# Patient Record
Sex: Female | Born: 1955
Health system: Southern US, Community
[De-identification: ages and names within clinical notes are randomized; demographics above are authoritative.]

## PROBLEM LIST (undated history)

## (undated) DIAGNOSIS — R569 Unspecified convulsions: Secondary | ICD-10-CM

## (undated) DIAGNOSIS — G4730 Sleep apnea, unspecified: Secondary | ICD-10-CM

## (undated) DIAGNOSIS — E119 Type 2 diabetes mellitus without complications: Secondary | ICD-10-CM

## (undated) DIAGNOSIS — C539 Malignant neoplasm of cervix uteri, unspecified: Secondary | ICD-10-CM

## (undated) DIAGNOSIS — I10 Essential (primary) hypertension: Secondary | ICD-10-CM

## (undated) DIAGNOSIS — E669 Obesity, unspecified: Secondary | ICD-10-CM

## (undated) DIAGNOSIS — Z87442 Personal history of urinary calculi: Secondary | ICD-10-CM

## (undated) DIAGNOSIS — H409 Unspecified glaucoma: Secondary | ICD-10-CM

## (undated) DIAGNOSIS — I428 Other cardiomyopathies: Secondary | ICD-10-CM

## (undated) DIAGNOSIS — I219 Acute myocardial infarction, unspecified: Secondary | ICD-10-CM

## (undated) DIAGNOSIS — F32A Depression, unspecified: Secondary | ICD-10-CM

## (undated) DIAGNOSIS — I509 Heart failure, unspecified: Secondary | ICD-10-CM

## (undated) DIAGNOSIS — F329 Major depressive disorder, single episode, unspecified: Secondary | ICD-10-CM

## (undated) HISTORY — PX: CERVIX LESION DESTRUCTION: SHX591

## (undated) HISTORY — DX: Essential (primary) hypertension: I10

## (undated) HISTORY — DX: Obesity, unspecified: E66.9

## (undated) HISTORY — DX: Heart failure, unspecified: I50.9

## (undated) HISTORY — PX: CATARACT EXTRACTION: SUR2

## (undated) HISTORY — DX: Unspecified glaucoma: H40.9

## (undated) HISTORY — DX: Other cardiomyopathies: I42.8

## (undated) HISTORY — PX: CARDIAC CATHETERIZATION: SHX172

## (undated) HISTORY — DX: Sleep apnea, unspecified: G47.30

---

## 1998-01-30 ENCOUNTER — Other Ambulatory Visit: Admission: RE | Admit: 1998-01-30 | Discharge: 1998-01-30 | Payer: Self-pay | Admitting: Obstetrics and Gynecology

## 1998-02-14 ENCOUNTER — Ambulatory Visit (HOSPITAL_COMMUNITY): Admission: RE | Admit: 1998-02-14 | Discharge: 1998-02-14 | Payer: Self-pay | Admitting: Obstetrics and Gynecology

## 1998-04-18 ENCOUNTER — Other Ambulatory Visit: Admission: RE | Admit: 1998-04-18 | Discharge: 1998-04-18 | Payer: Self-pay | Admitting: Obstetrics and Gynecology

## 1998-04-19 ENCOUNTER — Other Ambulatory Visit: Admission: RE | Admit: 1998-04-19 | Discharge: 1998-04-19 | Payer: Self-pay | Admitting: Obstetrics & Gynecology

## 1998-07-10 ENCOUNTER — Inpatient Hospital Stay (HOSPITAL_COMMUNITY): Admission: AD | Admit: 1998-07-10 | Discharge: 1998-07-11 | Payer: Self-pay | Admitting: Obstetrics and Gynecology

## 2010-09-21 ENCOUNTER — Emergency Department (HOSPITAL_COMMUNITY)
Admission: EM | Admit: 2010-09-21 | Discharge: 2010-09-21 | Payer: Self-pay | Source: Home / Self Care | Attending: Internal Medicine | Admitting: Internal Medicine

## 2010-09-21 ENCOUNTER — Inpatient Hospital Stay (HOSPITAL_COMMUNITY)
Admission: EM | Admit: 2010-09-21 | Discharge: 2010-09-25 | Payer: Self-pay | Attending: Internal Medicine | Admitting: Internal Medicine

## 2010-09-22 ENCOUNTER — Encounter: Payer: Self-pay | Admitting: Internal Medicine

## 2010-12-16 LAB — BASIC METABOLIC PANEL
BUN: 17 mg/dL (ref 6–23)
BUN: 26 mg/dL — ABNORMAL HIGH (ref 6–23)
CO2: 29 mEq/L (ref 19–32)
CO2: 29 mEq/L (ref 19–32)
CO2: 33 mEq/L — ABNORMAL HIGH (ref 19–32)
Calcium: 10.1 mg/dL (ref 8.4–10.5)
Chloride: 100 mEq/L (ref 96–112)
Chloride: 97 mEq/L (ref 96–112)
Chloride: 98 mEq/L (ref 96–112)
GFR calc Af Amer: >60 mL/min (ref >60)
GFR calc Af Amer: >60 mL/min (ref >60)
GFR calc non Af Amer: 53 mL/min — ABNORMAL LOW (ref >60)
GFR calc non Af Amer: >60 mL/min (ref >60)
Glucose, Bld: 131 mg/dL — ABNORMAL HIGH (ref 70–99)
Glucose, Bld: 181 mg/dL — ABNORMAL HIGH (ref 70–99)
Potassium: 3.9 mEq/L (ref 3.5–5.1)
Potassium: 4.1 mEq/L (ref 3.5–5.1)
Potassium: 4.3 mEq/L (ref 3.5–5.1)
Sodium: 139 mEq/L (ref 135–145)
Sodium: 141 mEq/L (ref 135–145)
Sodium: 141 mEq/L (ref 135–145)

## 2010-12-16 LAB — APTT: aPTT: 26 seconds (ref 24–37)

## 2010-12-16 LAB — GLUCOSE, CAPILLARY
Glucose-Capillary: 105 mg/dL — ABNORMAL HIGH (ref 70–99)
Glucose-Capillary: 116 mg/dL — ABNORMAL HIGH (ref 70–99)
Glucose-Capillary: 135 mg/dL — ABNORMAL HIGH (ref 70–99)
Glucose-Capillary: 202 mg/dL — ABNORMAL HIGH (ref 70–99)
Glucose-Capillary: 216 mg/dL — ABNORMAL HIGH (ref 70–99)
Glucose-Capillary: 238 mg/dL — ABNORMAL HIGH (ref 70–99)
Glucose-Capillary: 242 mg/dL — ABNORMAL HIGH (ref 70–99)
Glucose-Capillary: 80 mg/dL (ref 70–99)

## 2010-12-16 LAB — COMPREHENSIVE METABOLIC PANEL
AST: 34 U/L (ref 0–37)
Albumin: 4.1 g/dL (ref 3.5–5.2)
BUN: 18 mg/dL (ref 6–23)
Calcium: 9.4 mg/dL (ref 8.4–10.5)
Chloride: 97 mEq/L (ref 96–112)
Creatinine, Ser: 0.88 mg/dL (ref 0.4–1.2)
GFR calc Af Amer: >60 mL/min (ref >60)
GFR calc non Af Amer: >60 mL/min (ref >60)
Potassium: 4 mEq/L (ref 3.5–5.1)
Sodium: 135 mEq/L (ref 135–145)

## 2010-12-16 LAB — POCT I-STAT 3, ART BLOOD GAS (G3+)
Acid-Base Excess: 4 mmol/L — ABNORMAL HIGH (ref 0.0–2.0)
Bicarbonate: 29.8 mEq/L — ABNORMAL HIGH (ref 20.0–24.0)
pH, Arterial: 7.412 — ABNORMAL HIGH (ref 7.350–7.400)
pO2, Arterial: 71 mmHg — ABNORMAL LOW (ref 80.0–100.0)

## 2010-12-16 LAB — LIPID PANEL
HDL: 37 mg/dL — ABNORMAL LOW (ref >39)
Total CHOL/HDL Ratio: 5.7 RATIO
VLDL: 49 mg/dL — ABNORMAL HIGH (ref 0–40)

## 2010-12-16 LAB — MRSA PCR SCREENING: MRSA by PCR: NEGATIVE

## 2010-12-16 LAB — DIFFERENTIAL
Eosinophils Absolute: 0.2 10*3/uL (ref 0.0–0.7)
Eosinophils Relative: 2 % (ref 0–5)
Lymphs Abs: 2.3 10*3/uL (ref 0.7–4.0)
Monocytes Absolute: 0.5 10*3/uL (ref 0.1–1.0)
Monocytes Relative: 5 % (ref 3–12)

## 2010-12-16 LAB — CBC
HCT: 48.1 % — ABNORMAL HIGH (ref 36.0–46.0)
Hemoglobin: 15.6 g/dL — ABNORMAL HIGH (ref 12.0–15.0)
Hemoglobin: 16.4 g/dL — ABNORMAL HIGH (ref 12.0–15.0)
MCH: 30.1 pg (ref 26.0–34.0)
MCV: 89 fL (ref 78.0–100.0)
MCV: 89.2 fL (ref 78.0–100.0)
Platelets: 257 10*3/uL (ref 150–400)
RBC: 5.19 MIL/uL — ABNORMAL HIGH (ref 3.87–5.11)
RBC: 5.34 MIL/uL — ABNORMAL HIGH (ref 3.87–5.11)
RBC: 5.39 MIL/uL — ABNORMAL HIGH (ref 3.87–5.11)
RDW: 13.7 % (ref 11.5–15.5)
WBC: 10.4 10*3/uL (ref 4.0–10.5)
WBC: 10.7 10*3/uL — ABNORMAL HIGH (ref 4.0–10.5)
WBC: 9.4 10*3/uL (ref 4.0–10.5)

## 2010-12-16 LAB — RAPID URINE DRUG SCREEN, HOSP PERFORMED
Benzodiazepines: NOT DETECTED
Cocaine: NOT DETECTED
Opiates: NOT DETECTED
Tetrahydrocannabinol: NOT DETECTED

## 2010-12-16 LAB — BRAIN NATRIURETIC PEPTIDE: Pro B Natriuretic peptide (BNP): 449 pg/mL — ABNORMAL HIGH (ref 0.0–100.0)

## 2010-12-16 LAB — PROTIME-INR: INR: 0.99 (ref 0.00–1.49)

## 2010-12-16 LAB — POCT I-STAT 3, VENOUS BLOOD GAS (G3P V)
Acid-base deficit: 3 mmol/L — ABNORMAL HIGH (ref 0.0–2.0)
Bicarbonate: 24.4 mEq/L — ABNORMAL HIGH (ref 20.0–24.0)
O2 Saturation: 63 %
pCO2, Ven: 49 mmHg (ref 45.0–50.0)
pO2, Ven: 36 mmHg (ref 30.0–45.0)

## 2010-12-16 LAB — T4, FREE: Free T4: 1.23 ng/dL (ref 0.80–1.80)

## 2011-03-28 ENCOUNTER — Encounter: Payer: Self-pay | Admitting: Internal Medicine

## 2011-03-31 ENCOUNTER — Ambulatory Visit (INDEPENDENT_AMBULATORY_CARE_PROVIDER_SITE_OTHER): Payer: Self-pay | Admitting: Internal Medicine

## 2011-03-31 ENCOUNTER — Encounter: Payer: Self-pay | Admitting: Internal Medicine

## 2011-03-31 VITALS — BP 148/96 | HR 98 | Ht 63.0 in | Wt 165.8 lb

## 2011-03-31 DIAGNOSIS — E119 Type 2 diabetes mellitus without complications: Secondary | ICD-10-CM

## 2011-03-31 DIAGNOSIS — I5022 Chronic systolic (congestive) heart failure: Secondary | ICD-10-CM

## 2011-03-31 DIAGNOSIS — I10 Essential (primary) hypertension: Secondary | ICD-10-CM | POA: Insufficient documentation

## 2011-03-31 MED ORDER — LOSARTAN POTASSIUM 100 MG PO TABS: 100.0000 mg | ORAL_TABLET | Freq: Every day | ORAL | Status: DC

## 2011-03-31 MED ORDER — CARVEDILOL 25 MG PO TABS: 25.0000 mg | ORAL_TABLET | Freq: Two times a day (BID) | ORAL | Status: DC

## 2011-03-31 MED ORDER — DIGOXIN 125 MCG PO TABS: 125.0000 ug | ORAL_TABLET | Freq: Every day | ORAL | Status: DC

## 2011-03-31 MED ORDER — FUROSEMIDE 20 MG PO TABS: 20.0000 mg | ORAL_TABLET | Freq: Every day | ORAL | Status: DC

## 2011-03-31 MED ORDER — SPIRONOLACTONE 25 MG PO TABS: 25.0000 mg | ORAL_TABLET | Freq: Every day | ORAL | Status: DC

## 2011-03-31 NOTE — Assessment & Plan Note (Signed)
Much improved. Now NYHA I-II.  Volume status looks good. Suspect EF may have recovered some with improved BP control. Given cough will change captopril to losartan 100 qd (needs generic). Check labs. Needs repeat echo.

## 2011-03-31 NOTE — Assessment & Plan Note (Signed)
BP much improved. Long talk about need for strict compliance and need to f/u with [place] in HF clinic and to get PCP (will sign up with Dr. Elmore Guise - her father's PCP).  Changing captopril to losartan (also higher dose).

## 2011-03-31 NOTE — Progress Notes (Signed)
Addended by: Noralee Space on: 03/31/2011 03:46 PM   Modules accepted: Orders

## 2011-03-31 NOTE — Patient Instructions (Signed)
Stop Captopril Start Losartan 100 mg daily  Labs today (bmet, bnp, hgb A1C)  Your physician has requested that you have an echocardiogram. Echocardiography is a painless test that uses sound waves to create images of your heart. It provides your doctor with information about the size and shape of your heart and how well your heart's chambers and valves are working. This procedure takes approximately one hour. There are no restrictions for this procedure.  Your physician recommends that you schedule a follow-up appointment in: 1 month

## 2011-03-31 NOTE — Assessment & Plan Note (Signed)
Once again, discussed need for increased compliance and need for PCP and regular sugar checks. Will check HgBA1c today. She will go to see Dr. Chilton Si.

## 2011-03-31 NOTE — Progress Notes (Signed)
HPI:   Sally Hendrix is a 55 y/o obese woman with h/o poorly-controlled HTN, DM2, OSA admitted 12/11 with acute deompensated HF in setting of BP 207/132. EF 15%. Underwent cath which showed normal cors. Diuresed and started on appropriate HF meds. Was supposed to f/u with Dr. Deborah Chalk but did not. Has not seen PCP either.  Says she feels fine. Was walking 2 hours per day but stopped after her son told her to. Denies CP or exertional dyspnea. Can walk all around store without stopping. No edema, orthopnea or PND. Compliant with all meds. SBP typically 125-140. Not checking sugars regularly as she cannot find her machine. Avoiding sweets. Weight very stable.   Has severe dry cough.    ROS: All other systems normal except as mentioned in HPI, past medical history and problem list.    Past Medical History  Diagnosis Date  . CHF (congestive heart failure)   . Nonischemic cardiomyopathy   . Hypertension   . Diabetes mellitus   . Sleep apnea   . Obesity     Current Outpatient Prescriptions  Medication Sig Dispense Refill  . aspirin 81 MG tablet Take 162 mg by mouth daily.       . captopril (CAPOTEN) 25 MG tablet Take 25 mg by mouth 3 (three) times daily.        . carvedilol (COREG) 25 MG tablet Take 25 mg by mouth 2 (two) times daily with a meal.        . Coenzyme Q10 (COQ10) 100 MG CAPS Take 1 capsule by mouth daily.        . digoxin (LANOXIN) 0.125 MG tablet Take 125 mcg by mouth daily.        . Flaxseed, Linseed, (FLAX SEED OIL) 1000 MG CAPS Take 1 capsule by mouth daily.        . furosemide (LASIX) 20 MG tablet Take 20 mg by mouth daily.        Marland Kitchen glipiZIDE (GLUCOTROL) 10 MG tablet Take 10 mg by mouth daily.        . metFORMIN (GLUCOPHAGE) 1000 MG tablet Take 1,000 mg by mouth 2 (two) times daily with a meal.        . Omega-3 Fatty Acids (FISH OIL) 1000 MG CAPS Take 1 capsule by mouth daily.        . Red Yeast Rice 600 MG CAPS Take 2 capsules by mouth daily.        Marland Kitchen spironolactone (ALDACTONE)  25 MG tablet Take 25 mg by mouth daily.        Marland Kitchen DISCONTD: atorvastatin (LIPITOR) 20 MG tablet Take 20 mg by mouth daily.           No Known Allergies  History   Social History  . Marital Status: Married    Spouse Name: N/A    Number of Children: 2  . Years of Education: N/A   Occupational History  . Sports coach    Social History Main Topics  . Smoking status: Former Games developer  . Smokeless tobacco: Not on file  . Alcohol Use: Yes     occasional  . Drug Use: No  . Sexually Active: Not on file   Other Topics Concern  . Not on file   Social History Narrative  . No narrative on file    Family History  Problem Relation Age of Onset  . Heart failure Mother     hx of - died of nonalcoholic cirrhosis  . Coronary artery  disease Father 35    alive  . Other Brother     brain tumor    PHYSICAL EXAM: Filed Vitals:   03/31/11 1501  BP: 148/96  Pulse: 98   General:  Well appearing. No respiratory difficulty HEENT: normal Neck: supple. no JVD. Carotids 2+ bilat; no bruits. No lymphadenopathy or thryomegaly appreciated. Cor: PMI nondisplaced. Regular rate & rhythm. No rubs, gallops or murmurs. Lungs: clear Abdomen: obese. soft, nontender, nondistended. No hepatosplenomegaly. No bruits or masses. Good bowel sounds. Extremities: no cyanosis, clubbing, rash, edema Neuro: alert & oriented x 3, cranial nerves grossly intact. moves all 4 extremities w/o difficulty. Affect pleasant.  ECG: NSR 99 Non specifc ST-T wave abnormalities.    ASSESSMENT & PLAN:

## 2011-04-01 ENCOUNTER — Telehealth: Payer: Self-pay | Admitting: Internal Medicine

## 2011-04-01 LAB — BASIC METABOLIC PANEL
CO2: 27 mEq/L (ref 19–32)
Calcium: 10.1 mg/dL (ref 8.4–10.5)
Creatinine, Ser: 1.1 mg/dL (ref 0.4–1.2)
GFR: 55.13 mL/min — ABNORMAL LOW (ref >60.00)

## 2011-04-01 NOTE — Telephone Encounter (Signed)
Pt's office notes are being faxed to dr Dorma Russell green not their pt, maybe art green they said

## 2011-04-01 NOTE — Telephone Encounter (Signed)
Heather, please check to see where these notes are going--we have been sending notes to wrong dr Suezanne Cheshire nt

## 2011-04-02 NOTE — Telephone Encounter (Signed)
Faxed to Dr Lenoria Farrier Chilton Si

## 2011-04-10 ENCOUNTER — Ambulatory Visit (HOSPITAL_COMMUNITY): Payer: Self-pay | Attending: Internal Medicine

## 2011-04-10 DIAGNOSIS — E119 Type 2 diabetes mellitus without complications: Secondary | ICD-10-CM | POA: Insufficient documentation

## 2011-04-10 DIAGNOSIS — E669 Obesity, unspecified: Secondary | ICD-10-CM | POA: Insufficient documentation

## 2011-04-10 DIAGNOSIS — I5022 Chronic systolic (congestive) heart failure: Secondary | ICD-10-CM

## 2011-04-10 DIAGNOSIS — I10 Essential (primary) hypertension: Secondary | ICD-10-CM | POA: Insufficient documentation

## 2011-04-14 ENCOUNTER — Telehealth: Payer: Self-pay | Admitting: Internal Medicine

## 2011-04-14 NOTE — Telephone Encounter (Signed)
Pt aware of echo results and very excited

## 2011-04-14 NOTE — Telephone Encounter (Signed)
Test results

## 2011-05-05 ENCOUNTER — Encounter: Payer: Self-pay | Admitting: Internal Medicine

## 2011-05-05 ENCOUNTER — Ambulatory Visit (INDEPENDENT_AMBULATORY_CARE_PROVIDER_SITE_OTHER): Payer: Self-pay | Admitting: Internal Medicine

## 2011-05-05 VITALS — BP 138/84 | HR 86 | Resp 16 | Ht 63.0 in | Wt 162.0 lb

## 2011-05-05 DIAGNOSIS — I5022 Chronic systolic (congestive) heart failure: Secondary | ICD-10-CM

## 2011-05-05 DIAGNOSIS — I10 Essential (primary) hypertension: Secondary | ICD-10-CM

## 2011-05-05 MED ORDER — FUROSEMIDE 20 MG PO TABS: 20.0000 mg | ORAL_TABLET | ORAL | Status: DC

## 2011-05-05 NOTE — Progress Notes (Signed)
HPI:   Sally Hendrix is a 55 y/o woman with h/o poorly-controlled HTN, DM2, OSA admitted 12/11 with acute deompensated HF in setting of BP 207/132. EF 15%. Underwent cath which showed normal cors. Diuresed and started on appropriate HF meds.   At last visit we switched ACE-I to losartan due to cough.  Echo April 10, 2011. EF now 60%. RV normal.   Feels great. Walking an hour day. Denies CP or exertional dyspnea. Can walk all around store without stopping. No edema, orthopnea or PND. Compliant with all meds. SBP typically 120-130s/70.  Weight down 3 pounds.Cough resolved with discontinuation with ACE-I.    ROS: All other systems normal except as mentioned in HPI, past medical history and problem list.    Past Medical History  Diagnosis Date  . CHF (congestive heart failure)   . Nonischemic cardiomyopathy   . Hypertension   . Diabetes mellitus   . Sleep apnea   . Obesity     Current Outpatient Prescriptions  Medication Sig Dispense Refill  . aspirin 81 MG tablet Take 81 mg by mouth daily.       . carvedilol (COREG) 25 MG tablet Take 1 tablet (25 mg total) by mouth 2 (two) times daily with a meal.  60 tablet  6  . Coenzyme Q10 (COQ10) 100 MG CAPS Take 1 capsule by mouth daily.        . digoxin (LANOXIN) 0.125 MG tablet Take 1 tablet (125 mcg total) by mouth daily.  30 tablet  6  . Flaxseed, Linseed, (FLAX SEED OIL) 1000 MG CAPS Take 1 capsule by mouth daily.        . furosemide (LASIX) 20 MG tablet Take 1 tablet (20 mg total) by mouth daily.  30 tablet  6  . glipiZIDE (GLUCOTROL) 10 MG tablet Take 10 mg by mouth daily.        Marland Kitchen losartan (COZAAR) 100 MG tablet Take 1 tablet (100 mg total) by mouth daily.  30 tablet  6  . metFORMIN (GLUCOPHAGE) 1000 MG tablet Take 1,000 mg by mouth 2 (two) times daily with a meal.        . Omega-3 Fatty Acids (FISH OIL) 1000 MG CAPS Take 1 capsule by mouth daily.        . Red Yeast Rice 600 MG CAPS Take 2 capsules by mouth daily.        Marland Kitchen spironolactone  (ALDACTONE) 25 MG tablet Take 1 tablet (25 mg total) by mouth daily.  30 tablet  6     No Known Allergies  History   Social History  . Marital Status: Married    Spouse Name: N/A    Number of Children: 2  . Years of Education: N/A   Occupational History  . Sports coach    Social History Main Topics  . Smoking status: Former Games developer  . Smokeless tobacco: Not on file  . Alcohol Use: Yes     occasional  . Drug Use: No  . Sexually Active: Not on file   Other Topics Concern  . Not on file   Social History Narrative  . No narrative on file    Family History  Problem Relation Age of Onset  . Heart failure Mother     hx of - died of nonalcoholic cirrhosis  . Coronary artery disease Father 64    alive  . Other Brother     brain tumor    PHYSICAL EXAM: Filed Vitals:   05/05/11 1430  BP: 142/88  Pulse: 86  Resp: 16   General:  Well appearing. No respiratory difficulty HEENT: normal Neck: supple. no JVD. Carotids 2+ bilat; no bruits. No lymphadenopathy or thryomegaly appreciated. Cor: PMI nondisplaced. Regular rate & rhythm. No rubs, gallops or murmurs. Lungs: clear Abdomen: obese. soft, nontender, nondistended. No hepatosplenomegaly. No bruits or masses. Good bowel sounds. Extremities: no cyanosis, clubbing, rash, edema Neuro: alert & oriented x 3, cranial nerves grossly intact. moves all 4 extremities w/o difficulty. Affect pleasant.   ASSESSMENT & PLAN:

## 2011-05-05 NOTE — Assessment & Plan Note (Addendum)
Doing very well. NYHA I. Volume status looks good. EF has made full recovery with control of BP.  At goal with HF meds. Decrease lasix to qod; can take additional doses as needed for edema or weight gain.

## 2011-05-05 NOTE — Patient Instructions (Signed)
Decrease Lasix to every other day  Your physician recommends that you schedule a follow-up appointment in: 2 months in heart failure clinic

## 2011-05-05 NOTE — Assessment & Plan Note (Signed)
BP much improved. Systolics a bit borderline. Have asked her to continue to follow closely. If SBP persistently above 140 would add amlodipine 2.5mg  daily.

## 2011-06-30 ENCOUNTER — Encounter (HOSPITAL_COMMUNITY): Payer: Self-pay

## 2016-12-09 ENCOUNTER — Encounter (HOSPITAL_COMMUNITY): Payer: Self-pay

## 2016-12-09 ENCOUNTER — Emergency Department (HOSPITAL_COMMUNITY): Payer: Self-pay

## 2016-12-09 ENCOUNTER — Inpatient Hospital Stay (HOSPITAL_COMMUNITY)
Admission: EM | Admit: 2016-12-09 | Discharge: 2016-12-11 | DRG: 246 | Disposition: A | Discharge status: Home / Self Care | Payer: Self-pay | Attending: Internal Medicine | Admitting: Internal Medicine

## 2016-12-09 DIAGNOSIS — I428 Other cardiomyopathies: Secondary | ICD-10-CM | POA: Diagnosis present

## 2016-12-09 DIAGNOSIS — G4733 Obstructive sleep apnea (adult) (pediatric): Secondary | ICD-10-CM | POA: Diagnosis present

## 2016-12-09 DIAGNOSIS — N3000 Acute cystitis without hematuria: Secondary | ICD-10-CM | POA: Diagnosis present

## 2016-12-09 DIAGNOSIS — I5023 Acute on chronic systolic (congestive) heart failure: Secondary | ICD-10-CM | POA: Diagnosis present

## 2016-12-09 DIAGNOSIS — R079 Chest pain, unspecified: Secondary | ICD-10-CM | POA: Diagnosis present

## 2016-12-09 DIAGNOSIS — I214 Non-ST elevation (NSTEMI) myocardial infarction: Principal | ICD-10-CM | POA: Diagnosis present

## 2016-12-09 DIAGNOSIS — Z6826 Body mass index (BMI) 26.0-26.9, adult: Secondary | ICD-10-CM

## 2016-12-09 DIAGNOSIS — I160 Hypertensive urgency: Secondary | ICD-10-CM | POA: Diagnosis present

## 2016-12-09 DIAGNOSIS — E785 Hyperlipidemia, unspecified: Secondary | ICD-10-CM | POA: Diagnosis present

## 2016-12-09 DIAGNOSIS — E1165 Type 2 diabetes mellitus with hyperglycemia: Secondary | ICD-10-CM | POA: Diagnosis present

## 2016-12-09 DIAGNOSIS — Z79899 Other long term (current) drug therapy: Secondary | ICD-10-CM

## 2016-12-09 DIAGNOSIS — I5022 Chronic systolic (congestive) heart failure: Secondary | ICD-10-CM | POA: Diagnosis present

## 2016-12-09 DIAGNOSIS — Z87891 Personal history of nicotine dependence: Secondary | ICD-10-CM

## 2016-12-09 DIAGNOSIS — Z7984 Long term (current) use of oral hypoglycemic drugs: Secondary | ICD-10-CM

## 2016-12-09 DIAGNOSIS — Z9114 Patient's other noncompliance with medication regimen: Secondary | ICD-10-CM

## 2016-12-09 DIAGNOSIS — I10 Essential (primary) hypertension: Secondary | ICD-10-CM

## 2016-12-09 DIAGNOSIS — Z955 Presence of coronary angioplasty implant and graft: Secondary | ICD-10-CM

## 2016-12-09 DIAGNOSIS — Z7982 Long term (current) use of aspirin: Secondary | ICD-10-CM

## 2016-12-09 DIAGNOSIS — I2510 Atherosclerotic heart disease of native coronary artery without angina pectoris: Secondary | ICD-10-CM | POA: Diagnosis present

## 2016-12-09 DIAGNOSIS — E669 Obesity, unspecified: Secondary | ICD-10-CM | POA: Diagnosis present

## 2016-12-09 DIAGNOSIS — R000 Tachycardia, unspecified: Secondary | ICD-10-CM | POA: Diagnosis present

## 2016-12-09 DIAGNOSIS — B9620 Unspecified Escherichia coli [E. coli] as the cause of diseases classified elsewhere: Secondary | ICD-10-CM | POA: Diagnosis present

## 2016-12-09 DIAGNOSIS — I110 Hypertensive heart disease with heart failure: Secondary | ICD-10-CM | POA: Diagnosis present

## 2016-12-09 LAB — CBC WITH DIFFERENTIAL/PLATELET
BASOS ABS: 0 10*3/uL (ref 0.0–0.1)
Basophils Relative: 0 %
Eosinophils Absolute: 0.2 10*3/uL (ref 0.0–0.7)
Eosinophils Relative: 2 %
HEMATOCRIT: 42 % (ref 36.0–46.0)
HEMOGLOBIN: 14.4 g/dL (ref 12.0–15.0)
LYMPHS ABS: 3.1 10*3/uL (ref 0.7–4.0)
LYMPHS PCT: 28 %
MCH: 29.1 pg (ref 26.0–34.0)
MCHC: 34.3 g/dL (ref 30.0–36.0)
MCV: 84.8 fL (ref 78.0–100.0)
Monocytes Absolute: 0.5 10*3/uL (ref 0.1–1.0)
Monocytes Relative: 5 %
NEUTROS ABS: 7.1 10*3/uL (ref 1.7–7.7)
NEUTROS PCT: 65 %
Platelets: 264 10*3/uL (ref 150–400)
RBC: 4.95 MIL/uL (ref 3.87–5.11)
RDW: 13.3 % (ref 11.5–15.5)
WBC: 11 10*3/uL — AB (ref 4.0–10.5)

## 2016-12-09 LAB — COMPREHENSIVE METABOLIC PANEL
ALK PHOS: 119 U/L (ref 38–126)
ALT: 14 U/L (ref 14–54)
ANION GAP: 9 (ref 5–15)
AST: 27 U/L (ref 15–41)
Albumin: 3.6 g/dL (ref 3.5–5.0)
BUN: 15 mg/dL (ref 6–20)
CALCIUM: 9.2 mg/dL (ref 8.9–10.3)
CO2: 28 mmol/L (ref 22–32)
Chloride: 97 mmol/L — ABNORMAL LOW (ref 101–111)
Creatinine, Ser: 0.84 mg/dL (ref 0.44–1.00)
GFR calc non Af Amer: >60 mL/min (ref >60)
Glucose, Bld: 365 mg/dL — ABNORMAL HIGH (ref 65–99)
Potassium: 4.1 mmol/L (ref 3.5–5.1)
SODIUM: 134 mmol/L — AB (ref 135–145)
Total Bilirubin: 0.9 mg/dL (ref 0.3–1.2)
Total Protein: 6.7 g/dL (ref 6.5–8.1)

## 2016-12-09 LAB — RAPID URINE DRUG SCREEN, HOSP PERFORMED
AMPHETAMINES: NOT DETECTED
Barbiturates: NOT DETECTED
Benzodiazepines: NOT DETECTED
COCAINE: NOT DETECTED
OPIATES: NOT DETECTED
TETRAHYDROCANNABINOL: NOT DETECTED

## 2016-12-09 LAB — URINALYSIS, ROUTINE W REFLEX MICROSCOPIC
Bilirubin Urine: NEGATIVE
Hgb urine dipstick: NEGATIVE
KETONES UR: 5 mg/dL — AB
NITRITE: POSITIVE — AB
PH: 6 (ref 5.0–8.0)
Protein, ur: 100 mg/dL — AB
SPECIFIC GRAVITY, URINE: 1.021 (ref 1.005–1.030)

## 2016-12-09 LAB — CBC
HCT: 42.4 % (ref 36.0–46.0)
HEMOGLOBIN: 14.7 g/dL (ref 12.0–15.0)
MCH: 29.4 pg (ref 26.0–34.0)
MCHC: 34.7 g/dL (ref 30.0–36.0)
MCV: 84.8 fL (ref 78.0–100.0)
Platelets: 254 10*3/uL (ref 150–400)
RBC: 5 MIL/uL (ref 3.87–5.11)
RDW: 13.3 % (ref 11.5–15.5)
WBC: 10.4 10*3/uL (ref 4.0–10.5)

## 2016-12-09 LAB — CREATININE, SERUM
CREATININE: 0.88 mg/dL (ref 0.44–1.00)
GFR calc non Af Amer: >60 mL/min (ref >60)

## 2016-12-09 LAB — GLUCOSE, CAPILLARY: Glucose-Capillary: 353 mg/dL — ABNORMAL HIGH (ref 65–99)

## 2016-12-09 LAB — BRAIN NATRIURETIC PEPTIDE: B Natriuretic Peptide: 20 pg/mL (ref 0.0–100.0)

## 2016-12-09 LAB — TROPONIN I
TROPONIN I: 0.03 ng/mL — AB (ref <0.03)
Troponin I: 0.09 ng/mL (ref <0.03)
Troponin I: 0.14 ng/mL (ref <0.03)

## 2016-12-09 LAB — DIGOXIN LEVEL: Digoxin Level: <0.2 ng/mL — ABNORMAL LOW (ref 0.8–2.0)

## 2016-12-09 MED ORDER — HYDRALAZINE HCL 20 MG/ML IJ SOLN
10.0000 mg | INTRAMUSCULAR | Status: DC | PRN
  Administered 2016-12-10: 10 mg via INTRAVENOUS
  Filled 2016-12-09: qty 1

## 2016-12-09 MED ORDER — ACETAMINOPHEN 325 MG PO TABS: 650.0000 mg | ORAL_TABLET | ORAL | Status: DC | PRN

## 2016-12-09 MED ORDER — ASPIRIN EC 325 MG PO TBEC
325.0000 mg | DELAYED_RELEASE_TABLET | Freq: Every day | ORAL | Status: DC
  Administered 2016-12-10: 325 mg via ORAL
  Filled 2016-12-09: qty 1

## 2016-12-09 MED ORDER — NITROGLYCERIN 0.4 MG SL SUBL: 0.4000 mg | SUBLINGUAL_TABLET | SUBLINGUAL | Status: DC | PRN

## 2016-12-09 MED ORDER — INSULIN ASPART 100 UNIT/ML ~~LOC~~ SOLN
4.0000 [IU] | Freq: Once | SUBCUTANEOUS | Status: AC
  Administered 2016-12-10: 4 [IU] via SUBCUTANEOUS

## 2016-12-09 MED ORDER — SULFAMETHOXAZOLE-TRIMETHOPRIM 800-160 MG PO TABS: 1.0000 | ORAL_TABLET | Freq: Once | ORAL | Status: DC

## 2016-12-09 MED ORDER — ONDANSETRON HCL 4 MG/2ML IJ SOLN: 4.0000 mg | Freq: Four times a day (QID) | INTRAMUSCULAR | Status: DC | PRN

## 2016-12-09 MED ORDER — GABAPENTIN 300 MG PO CAPS
300.0000 mg | ORAL_CAPSULE | Freq: Every day | ORAL | Status: DC
  Administered 2016-12-09 – 2016-12-10 (×2): 300 mg via ORAL
  Filled 2016-12-09 (×2): qty 1

## 2016-12-09 MED ORDER — ENOXAPARIN SODIUM 40 MG/0.4ML ~~LOC~~ SOLN
40.0000 mg | Freq: Every day | SUBCUTANEOUS | Status: DC
  Administered 2016-12-09: 40 mg via SUBCUTANEOUS
  Filled 2016-12-09 (×2): qty 0.4

## 2016-12-09 MED ORDER — LOSARTAN POTASSIUM 50 MG PO TABS
100.0000 mg | ORAL_TABLET | Freq: Every day | ORAL | Status: DC
  Administered 2016-12-09 – 2016-12-11 (×3): 100 mg via ORAL
  Filled 2016-12-09 (×4): qty 2

## 2016-12-09 MED ORDER — CARVEDILOL 12.5 MG PO TABS
25.0000 mg | ORAL_TABLET | Freq: Two times a day (BID) | ORAL | Status: DC
  Administered 2016-12-10 – 2016-12-11 (×2): 25 mg via ORAL
  Filled 2016-12-09 (×2): qty 2

## 2016-12-09 MED ORDER — METOPROLOL TARTRATE 5 MG/5ML IV SOLN
5.0000 mg | Freq: Once | INTRAVENOUS | Status: DC
  Filled 2016-12-09 (×2): qty 5

## 2016-12-09 MED ORDER — DIGOXIN 125 MCG PO TABS
125.0000 ug | ORAL_TABLET | Freq: Every day | ORAL | Status: DC
  Administered 2016-12-09 – 2016-12-10 (×2): 125 ug via ORAL
  Filled 2016-12-09 (×2): qty 1

## 2016-12-09 MED ORDER — INSULIN ASPART 100 UNIT/ML ~~LOC~~ SOLN
0.0000 [IU] | Freq: Three times a day (TID) | SUBCUTANEOUS | Status: DC
  Administered 2016-12-10: 7 [IU] via SUBCUTANEOUS
  Administered 2016-12-10: 5 [IU] via SUBCUTANEOUS
  Administered 2016-12-10: 2 [IU] via SUBCUTANEOUS

## 2016-12-09 NOTE — ED Notes (Signed)
Dr. Gilford Raid aware of trop.

## 2016-12-09 NOTE — Plan of Care (Signed)
Problem: Pain Managment: Goal: General experience of comfort will improve Outcome: Progressing Denies any pain at this time.

## 2016-12-09 NOTE — ED Notes (Signed)
Pt states she took 100 mg losartan and 25 mg of carvedilol from her purse. Will inform MD

## 2016-12-09 NOTE — ED Provider Notes (Addendum)
[facility] DEPT Provider Note   CSN: [id number] Arrival date & time: 12/09/16  1617     History   Chief Complaint Chief Complaint  Patient presents with  . Chest Pain    HPI Sally Hendrix is a 61 y.o. female.  Pt presents to the ED today with CP.  Pt said it started this morning.  She has a hx of HTN and DM.  She has not taken any of her meds since 3/2.  The pt said she got busy and forgot.  The pt does have a hx of nonischemic cardiomyopathy with an initial EF of 15% in December of 2011.  She had a cath at the time which showed normal coronary arteries.  The pt had a repeat ECHO in July of 2012 which showed an EF up to 60%.  This is the last echo in the chart.  The pt initially went to urgent care who gave her 1 nitro which relieved her pain.  No return of pain now.  She also had 4 asa today.       Past Medical History:  Diagnosis Date  . CHF (congestive heart failure) ([place])   . Diabetes mellitus   . Hypertension   . Nonischemic cardiomyopathy ([place])   . Obesity   . Sleep apnea     Patient Active Problem List   Diagnosis Date Noted  . Chest pain 12/09/2016  . Chronic systolic heart failure ([place]) 03/31/2011  . Essential hypertension, benign 03/31/2011  . DM2 (diabetes mellitus, type 2) ([place]) 03/31/2011    Past Surgical History:  Procedure Laterality Date  . CARDIAC CATHETERIZATION      OB History    No data available       Home Medications    Prior to Admission medications   Medication Sig Start Date End Date Taking? Authorizing Provider  aspirin EC 81 MG tablet Take 324 mg by mouth once.   Yes Historical Provider, MD  carvedilol (COREG) 25 MG tablet Take 1 tablet (25 mg total) by mouth 2 (two) times daily with a meal. 03/31/11  Yes Jolaine Artist, MD  digoxin (LANOXIN) 0.125 MG tablet Take 1 tablet (125 mcg total) by mouth daily. 03/31/11  Yes Jolaine Artist, MD  furosemide (LASIX) 20 MG tablet Take 1 tablet (20 mg total) by mouth every other day.  05/05/11  Yes Jolaine Artist, MD  gabapentin (NEURONTIN) 300 MG capsule Take 300 mg by mouth at bedtime.   Yes Historical Provider, MD  glimepiride (AMARYL) 2 MG tablet Take 2 mg by mouth daily with breakfast.   Yes Historical Provider, MD  ibuprofen (ADVIL,MOTRIN) 200 MG tablet Take 800 mg by mouth every 6 (six) hours as needed for moderate pain.   Yes Historical Provider, MD  losartan (COZAAR) 100 MG tablet Take 1 tablet (100 mg total) by mouth daily. 03/31/11 12/09/16 Yes Shaune Pascal Bensimhon, MD  metFORMIN (GLUCOPHAGE) 1000 MG tablet Take 500 mg by mouth 2 (two) times daily with a meal.    Yes Historical Provider, MD  aspirin 81 MG tablet Take 81 mg by mouth daily.     Historical Provider, MD    Family History Family History  Problem Relation Age of Onset  . Heart failure Mother     hx of - died of nonalcoholic cirrhosis  . Coronary artery disease Father 39    alive  . Other Brother     brain tumor    Social History Social History  Substance  Use Topics  . Smoking status: Former Research scientist (life sciences)  . Smokeless tobacco: Never Used  . Alcohol use Yes     Comment: occasional     Allergies   Patient has no known allergies.   Review of Systems Review of Systems  Cardiovascular: Positive for chest pain.  All other systems reviewed and are negative.    Physical Exam Updated Vital Signs BP 147/88   Pulse 100   Temp 98.7 F (37.1 C)   Resp 17   Ht 5\' 3"  (1.6 m)   Wt 149 lb (67.6 kg)   SpO2 98%   BMI 26.39 kg/m   Physical Exam  Constitutional: She is oriented to person, place, and time. She appears well-developed and well-nourished.  HENT:  Head: Normocephalic and atraumatic.  Right Ear: External ear normal.  Left Ear: External ear normal.  Nose: Nose normal.  Mouth/Throat: Oropharynx is clear and moist.  Eyes: Conjunctivae and EOM are normal. Pupils are equal, round, and reactive to light.  Neck: Normal range of motion. Neck supple.  Cardiovascular: Regular rhythm, normal  heart sounds and intact distal pulses.  Tachycardia present.   Pulmonary/Chest: Effort normal and breath sounds normal.  Abdominal: Soft. Bowel sounds are normal.  Musculoskeletal: Normal range of motion.  Neurological: She is alert and oriented to person, place, and time.  Skin: Skin is warm and dry.  Psychiatric: She has a normal mood and affect. Her behavior is normal. Judgment and thought content normal.  Nursing note and vitals reviewed.    ED Treatments / Results  Labs (all labs ordered are listed, but only abnormal results are displayed) Labs Reviewed  COMPREHENSIVE METABOLIC PANEL - Abnormal; Notable for the following:       Result Value   Sodium 134 (*)    Chloride 97 (*)    Glucose, Bld 365 (*)    All other components within normal limits  TROPONIN I - Abnormal; Notable for the following:    Troponin I 0.03 (*)    All other components within normal limits  URINALYSIS, ROUTINE W REFLEX MICROSCOPIC - Abnormal; Notable for the following:    APPearance CLOUDY (*)    Glucose, UA >=500 (*)    Ketones, ur 5 (*)    Protein, ur 100 (*)    Nitrite POSITIVE (*)    Leukocytes, UA TRACE (*)    Bacteria, UA FEW (*)    Squamous Epithelial / LPF 0-5 (*)    All other components within normal limits  DIGOXIN LEVEL - Abnormal; Notable for the following:    Digoxin Level <0.2 (*)    All other components within normal limits  CBC WITH DIFFERENTIAL/PLATELET - Abnormal; Notable for the following:    WBC 11.0 (*)    All other components within normal limits  BRAIN NATRIURETIC PEPTIDE  TROPONIN I    EKG  EKG Interpretation  Date/Time:  Tuesday December 09 2016 16:18:33 EST Ventricular Rate:  112 PR Interval:    QRS Duration: 103 QT Interval:  360 QTC Calculation: 492 R Axis:   -29 Text Interpretation:  Sinus tachycardia Probable left atrial enlargement Borderline left axis deviation Abnormal R-wave progression, late transition Borderline prolonged QT interval Confirmed by Gilford Raid  MD, Kwali Wrinkle ([id number]) on 12/09/2016 4:30:56 PM       Radiology Dg Chest 2 View  Result Date: 12/09/2016 CLINICAL DATA:  Left-sided chest pain radiating down the left arm, some shortness of breath EXAM: CHEST  2 VIEW COMPARISON:  Chest x-ray of 09/23/2010 FINDINGS:  No definite active infiltrate or effusion is seen. Mediastinal and hilar contours are unremarkable. Mild cardiomegaly is stable. The only questionable abnormality is deep within the lung posterior sulcus on the lateral view. This area of opacity probably is due to overlapping bony structures, but a subtle lung lesion would be very difficult to exclude. Consider either followup chest x-ray or CT of the chest to assess further. IMPRESSION: 1. No definite active process. 2. Opacity deep in the posterior lung sulcus only on the lateral view probably due to overlapping bony structures but recommend attention to this area on followup chest x-ray. Electronically Signed   By: Ivar Drape M.D.   On: 12/09/2016 16:58    Procedures Procedures (including critical care time)  Medications Ordered in ED Medications  metoprolol (LOPRESSOR) injection 5 mg (5 mg Intravenous Not Given 12/09/16 1800)  sulfamethoxazole-trimethoprim (BACTRIM DS,SEPTRA DS) 800-160 MG per tablet 1 tablet (not administered)     Initial Impression / Assessment and Plan / ED Course  I have reviewed the triage vital signs and the nursing notes.  Pertinent labs & imaging results that were available during my care of the patient were reviewed by me and considered in my medical decision making (see chart for details).    Pt took her normal home meds in her room.  BP is still high, so she was given 5 mg of lopressor.  Pt d/w Dr. Hal Hope (hospitalist) for admission.  Pt also has an UTI and was treated with bactrim.  Final Clinical Impressions(s) / ED Diagnoses   Final diagnoses:  Chest pain, unspecified type  Poorly controlled type 2 diabetes mellitus ([place])  Essential  hypertension  Acute cystitis without hematuria    New Prescriptions New Prescriptions   No medications on file     Isla Pence, MD 12/09/16 1946    Isla Pence, MD 12/09/16 2136

## 2016-12-09 NOTE — H&P (Addendum)
History and Physical    Sally Hendrix [id number] DOB: Aug 22, 1956 DOA: 12/09/2016  PCP: Donnajean Lopes, MD  Patient coming from: Home.  Chief Complaint: Chest pain.  HPI: Sally Hendrix is a 61 y.o. female with history of nonischemic cardiomyopathy, hypertension and diabetes mellitus presents to the ER because of chest pain. Patient started experience chest pain yesterday afternoon which was retrosternal and lasted for a few minutes and resolved after patient took some aspirin. Then patient took a small nap and woke up and started experiencing chest pain again and at this time the pain was radiating to his left arm. Patient took aspirin again. Patient was brought to the ER by patient's daughter.   ED Course: In the ER patient's blood pressure is found to be markedly elevated. Patient's chest pain resolved with sublingual nitroglycerin. EKG shows sinus tachycardia. Chest x-ray shows nonspecific opacity but nothing acute. Blood sugar is found to be elevated but not in DKA. Patient states over the last 10 days patient has not taken her medications.  Review of Systems: As per HPI, rest all negative.   Past Medical History:  Diagnosis Date  . CHF (congestive heart failure) ([place])   . Diabetes mellitus   . Hypertension   . Nonischemic cardiomyopathy ([place])   . Obesity   . Sleep apnea     Past Surgical History:  Procedure Laterality Date  . CARDIAC CATHETERIZATION       reports that she has quit smoking. She has never used smokeless tobacco. She reports that she drinks alcohol. She reports that she does not use drugs.  No Known Allergies  Family History  Problem Relation Age of Onset  . Heart failure Mother     hx of - died of nonalcoholic cirrhosis  . Coronary artery disease Father 52    alive  . Other Brother     brain tumor    Prior to Admission medications   Medication Sig Start Date End Date Taking? Authorizing Provider  aspirin EC 81 MG tablet Take 324 mg by mouth  once.   Yes Historical Provider, MD  carvedilol (COREG) 25 MG tablet Take 1 tablet (25 mg total) by mouth 2 (two) times daily with a meal. 03/31/11  Yes Jolaine Artist, MD  digoxin (LANOXIN) 0.125 MG tablet Take 1 tablet (125 mcg total) by mouth daily. 03/31/11  Yes Jolaine Artist, MD  furosemide (LASIX) 20 MG tablet Take 1 tablet (20 mg total) by mouth every other day. 05/05/11  Yes Jolaine Artist, MD  gabapentin (NEURONTIN) 300 MG capsule Take 300 mg by mouth at bedtime.   Yes Historical Provider, MD  glimepiride (AMARYL) 2 MG tablet Take 2 mg by mouth daily with breakfast.   Yes Historical Provider, MD  ibuprofen (ADVIL,MOTRIN) 200 MG tablet Take 800 mg by mouth every 6 (six) hours as needed for moderate pain.   Yes Historical Provider, MD  losartan (COZAAR) 100 MG tablet Take 1 tablet (100 mg total) by mouth daily. 03/31/11 12/09/16 Yes Shaune Pascal Bensimhon, MD  metFORMIN (GLUCOPHAGE) 1000 MG tablet Take 500 mg by mouth 2 (two) times daily with a meal.    Yes Historical Provider, MD  aspirin 81 MG tablet Take 81 mg by mouth daily.     Historical Provider, MD    Physical Exam: Vitals:   12/09/16 1900 12/09/16 2000 12/09/16 2100 12/09/16 2145  BP: (!) 176/103 147/88 156/97 148/93  Pulse: 100  89 91  Resp: 13 17 17  16  Temp:      SpO2: 98%  97% 96%  Weight:      Height:          Constitutional: Moderately built and nourished. Vitals:   12/09/16 1900 12/09/16 2000 12/09/16 2100 12/09/16 2145  BP: (!) 176/103 147/88 156/97 148/93  Pulse: 100  89 91  Resp: 13 17 17 16   Temp:      SpO2: 98%  97% 96%  Weight:      Height:       Eyes: Anicteric. No pallor. ENMT: No discharge from the ears eyes nose or mouth. Neck: No JVD elevated no mass felt. Respiratory: No rhonchi or crepitations. Cardiovascular: S1 and S2 heard no murmurs appreciated. Abdomen: Soft nontender bowel sounds present. No guarding or rigidity. Musculoskeletal: No edema. No joint effusion. Skin: No rash. Skin  appears warm. Neurologic: Alert awake oriented to time place and person. Moves all extremities. Psychiatric: Appears normal. Normal affect.   Labs on Admission: I have personally reviewed following labs and imaging studies  CBC:  Recent Labs Lab 12/09/16 2023  WBC 11.0*  NEUTROABS 7.1  HGB 14.4  HCT 42.0  MCV 84.8  PLT [id number]   Basic Metabolic Panel:  Recent Labs Lab 12/09/16 1720  NA 134*  K 4.1  CL 97*  CO2 28  GLUCOSE 365*  BUN 15  CREATININE 0.84  CALCIUM 9.2   GFR: Estimated Creatinine Clearance: 64.1 mL/min (by C-G formula based on SCr of 0.84 mg/dL). Liver Function Tests:  Recent Labs Lab 12/09/16 1720  AST 27  ALT 14  ALKPHOS 119  BILITOT 0.9  PROT 6.7  ALBUMIN 3.6   No results for input(s): LIPASE, AMYLASE in the last 168 hours. No results for input(s): AMMONIA in the last 168 hours. Coagulation Profile: No results for input(s): INR, PROTIME in the last 168 hours. Cardiac Enzymes:  Recent Labs Lab 12/09/16 1720 12/09/16 2023  TROPONINI 0.03* 0.09*   BNP (last 3 results) No results for input(s): PROBNP in the last 8760 hours. HbA1C: No results for input(s): HGBA1C in the last 72 hours. CBG: No results for input(s): GLUCAP in the last 168 hours. Lipid Profile: No results for input(s): CHOL, HDL, LDLCALC, TRIG, CHOLHDL, LDLDIRECT in the last 72 hours. Thyroid Function Tests: No results for input(s): TSH, T4TOTAL, FREET4, T3FREE, THYROIDAB in the last 72 hours. Anemia Panel: No results for input(s): VITAMINB12, FOLATE, FERRITIN, TIBC, IRON, RETICCTPCT in the last 72 hours. Urine analysis:    Component Value Date/Time   COLORURINE YELLOW 12/09/2016 2023   APPEARANCEUR CLOUDY (A) 12/09/2016 2023   LABSPEC 1.021 12/09/2016 2023   PHURINE 6.0 12/09/2016 2023   GLUCOSEU >=500 (A) 12/09/2016 2023   HGBUR NEGATIVE 12/09/2016 2023   BILIRUBINUR NEGATIVE 12/09/2016 2023   KETONESUR 5 (A) 12/09/2016 2023   PROTEINUR 100 (A) 12/09/2016 2023    NITRITE POSITIVE (A) 12/09/2016 2023   LEUKOCYTESUR TRACE (A) 12/09/2016 2023   Sepsis Labs: @LABRCNTIP (procalcitonin:4,lacticidven:4) )No results found for this or any previous visit (from the past 240 hour(s)).   Radiological Exams on Admission: Dg Chest 2 View  Result Date: 12/09/2016 CLINICAL DATA:  Left-sided chest pain radiating down the left arm, some shortness of breath EXAM: CHEST  2 VIEW COMPARISON:  Chest x-ray of 09/23/2010 FINDINGS: No definite active infiltrate or effusion is seen. Mediastinal and hilar contours are unremarkable. Mild cardiomegaly is stable. The only questionable abnormality is deep within the lung posterior sulcus on the lateral view. This area of  opacity probably is due to overlapping bony structures, but a subtle lung lesion would be very difficult to exclude. Consider either followup chest x-ray or CT of the chest to assess further. IMPRESSION: 1. No definite active process. 2. Opacity deep in the posterior lung sulcus only on the lateral view probably due to overlapping bony structures but recommend attention to this area on followup chest x-ray. Electronically Signed   By: Ivar Drape M.D.   On: 12/09/2016 16:58    EKG: Independently reviewed. Sinus tachycardia.  Assessment/Plan Principal Problem:   Chest pain Active Problems:   Chronic systolic heart failure (HCC)   Uncontrolled type 2 diabetes mellitus with hyperglycemia (HCC)   Hypertensive urgency    1. Chest pain - concerning for unstable angina. Patient's troponin is mildly positive. Patient is chest pain-free at this time. We will cycle cardiac markers continue Coreg aspirin will add statins and heparin if troponin tends to elevate. As per cardiology notes patient has had unremarkable cardiac cath previously. Consulted cardiology. 2. Uncontrolled diabetes mellitus type 2 - probably secondary to noncompliance with medications. Check hemoglobin A1c. For now I have placed patient on sliding scale  coverage. If blood sugar tends to be continuously elevated then may start patient on Lantus while inpatient. 3. Hypertensive urgency - probably contributing to #1 symptom. Patient is placed on home medications including Cozaar Coreg and also have placed patient on when necessary IV hydralazine. Closely follow blood pressure trends. 4. Chronic systolic heart failure - patient's EF in 2011 was around 10% and in 2012 was 60%. Presently he appears euvolemic.  5. Abnormal opacity in chest x-ray will need follow-up as outpatient.   DVT prophylaxis: Lovenox. Code Status: Full code.  Family Communication: Discussed with patient's son.  Disposition Plan: Home.  Consults called: Cardiology.  Admission status: Observation.    Rise Patience MD [facility] Pager [phone].  If 7PM-7AM, please contact night-coverage [email or web address] Password [email or web address]  12/09/2016, 10:02 PM

## 2016-12-09 NOTE — ED Triage Notes (Signed)
Pt arrives [facility] from [place] center where she had sought treatment for chest pain which started this AM relief of CP after 1 NTG sl. Pt had 2 Aspirin at UC. totasl of 4 aspirin today. Pt arrives with  No chest pain.

## 2016-12-09 NOTE — Progress Notes (Signed)
Patient admitted from ED with Chest pain. Denies any pain. Family at bedside. CCMD verified Telemetry with two nurse. Oriented to room and surroundings.

## 2016-12-10 ENCOUNTER — Observation Stay (HOSPITAL_COMMUNITY): Payer: Self-pay

## 2016-12-10 ENCOUNTER — Encounter (HOSPITAL_COMMUNITY)
Admission: EM | Disposition: A | Discharge status: Home / Self Care | Payer: Self-pay | Source: Home / Self Care | Attending: Internal Medicine

## 2016-12-10 DIAGNOSIS — I5022 Chronic systolic (congestive) heart failure: Secondary | ICD-10-CM

## 2016-12-10 DIAGNOSIS — I2510 Atherosclerotic heart disease of native coronary artery without angina pectoris: Secondary | ICD-10-CM

## 2016-12-10 DIAGNOSIS — I160 Hypertensive urgency: Secondary | ICD-10-CM

## 2016-12-10 DIAGNOSIS — I219 Acute myocardial infarction, unspecified: Secondary | ICD-10-CM

## 2016-12-10 DIAGNOSIS — R079 Chest pain, unspecified: Secondary | ICD-10-CM

## 2016-12-10 DIAGNOSIS — I214 Non-ST elevation (NSTEMI) myocardial infarction: Secondary | ICD-10-CM

## 2016-12-10 HISTORY — PX: CORONARY STENT INTERVENTION: CATH118234

## 2016-12-10 HISTORY — DX: Acute myocardial infarction, unspecified: I21.9

## 2016-12-10 HISTORY — PX: LEFT HEART CATH AND CORONARY ANGIOGRAPHY: CATH118249

## 2016-12-10 LAB — LIPID PANEL
CHOLESTEROL: 271 mg/dL — AB (ref 0–200)
HDL: 39 mg/dL — ABNORMAL LOW (ref >40)
LDL Cholesterol: 201 mg/dL — ABNORMAL HIGH (ref 0–99)
TRIGLYCERIDES: 153 mg/dL — AB (ref <150)
Total CHOL/HDL Ratio: 6.9 RATIO
VLDL: 31 mg/dL (ref 0–40)

## 2016-12-10 LAB — GLUCOSE, CAPILLARY
GLUCOSE-CAPILLARY: 300 mg/dL — AB (ref 65–99)
GLUCOSE-CAPILLARY: 312 mg/dL — AB (ref 65–99)
GLUCOSE-CAPILLARY: 359 mg/dL — AB (ref 65–99)
Glucose-Capillary: 153 mg/dL — ABNORMAL HIGH (ref 65–99)

## 2016-12-10 LAB — TROPONIN I
TROPONIN I: 1.94 ng/mL — AB (ref <0.03)
Troponin I: 1.19 ng/mL (ref <0.03)

## 2016-12-10 LAB — POCT ACTIVATED CLOTTING TIME: ACTIVATED CLOTTING TIME: 235 s

## 2016-12-10 LAB — PROTIME-INR
INR: 1.08
Prothrombin Time: 14 seconds (ref 11.4–15.2)

## 2016-12-10 LAB — HIV ANTIBODY (ROUTINE TESTING W REFLEX): HIV SCREEN 4TH GENERATION: NONREACTIVE

## 2016-12-10 SURGERY — LEFT HEART CATH AND CORONARY ANGIOGRAPHY

## 2016-12-10 MED ORDER — HEPARIN SODIUM (PORCINE) 1000 UNIT/ML IJ SOLN
INTRAMUSCULAR | Status: AC
  Filled 2016-12-10: qty 1

## 2016-12-10 MED ORDER — SODIUM CHLORIDE 0.9% FLUSH: 3.0000 mL | INTRAVENOUS | Status: DC | PRN

## 2016-12-10 MED ORDER — HEPARIN (PORCINE) IN NACL 2-0.9 UNIT/ML-% IJ SOLN
INTRAMUSCULAR | Status: AC
  Filled 2016-12-10: qty 1000

## 2016-12-10 MED ORDER — IOPAMIDOL (ISOVUE-370) INJECTION 76%
INTRAVENOUS | Status: DC | PRN
Start: 2016-12-10 — End: 2016-12-10
  Administered 2016-12-10: 110 mL via INTRA_ARTERIAL

## 2016-12-10 MED ORDER — VERAPAMIL HCL 2.5 MG/ML IV SOLN
INTRAVENOUS | Status: DC | PRN
  Administered 2016-12-10: 10 mL via INTRA_ARTERIAL

## 2016-12-10 MED ORDER — SODIUM CHLORIDE 0.9% FLUSH
3.0000 mL | Freq: Two times a day (BID) | INTRAVENOUS | Status: DC
  Administered 2016-12-11: 06:00:00 3 mL via INTRAVENOUS

## 2016-12-10 MED ORDER — HEPARIN SODIUM (PORCINE) 1000 UNIT/ML IJ SOLN
INTRAMUSCULAR | Status: DC | PRN
  Administered 2016-12-10: 3500 [IU] via INTRAVENOUS
  Administered 2016-12-10: 1500 [IU] via INTRAVENOUS
  Administered 2016-12-10: 3500 [IU] via INTRAVENOUS

## 2016-12-10 MED ORDER — SODIUM CHLORIDE 0.9 % WEIGHT BASED INFUSION: 1.0000 mL/kg/h | INTRAVENOUS | Status: DC

## 2016-12-10 MED ORDER — ASPIRIN EC 81 MG PO TBEC
81.0000 mg | DELAYED_RELEASE_TABLET | Freq: Every day | ORAL | Status: DC
  Administered 2016-12-11: 81 mg via ORAL
  Filled 2016-12-10: qty 1

## 2016-12-10 MED ORDER — LABETALOL HCL 5 MG/ML IV SOLN: 10.0000 mg | INTRAVENOUS | Status: AC | PRN

## 2016-12-10 MED ORDER — MIDAZOLAM HCL 2 MG/2ML IJ SOLN
INTRAMUSCULAR | Status: DC | PRN
  Administered 2016-12-10: 1 mg via INTRAVENOUS

## 2016-12-10 MED ORDER — HEPARIN (PORCINE) IN NACL 2-0.9 UNIT/ML-% IJ SOLN
INTRAMUSCULAR | Status: DC | PRN
  Administered 2016-12-10: 1000 mL

## 2016-12-10 MED ORDER — SODIUM CHLORIDE 0.9 % WEIGHT BASED INFUSION
3.0000 mL/kg/h | INTRAVENOUS | Status: DC
  Administered 2016-12-10: 3 mL/kg/h via INTRAVENOUS

## 2016-12-10 MED ORDER — HEPARIN BOLUS VIA INFUSION
3000.0000 [IU] | Freq: Once | INTRAVENOUS | Status: AC
Start: 2016-12-10 — End: 2016-12-10
  Administered 2016-12-10: 3000 [IU] via INTRAVENOUS
  Filled 2016-12-10: qty 3000

## 2016-12-10 MED ORDER — SODIUM CHLORIDE 0.9 % WEIGHT BASED INFUSION
1.0000 mL/kg/h | INTRAVENOUS | Status: AC
  Administered 2016-12-10: 1 mL/kg/h via INTRAVENOUS

## 2016-12-10 MED ORDER — ATORVASTATIN CALCIUM 80 MG PO TABS
80.0000 mg | ORAL_TABLET | Freq: Every day | ORAL | Status: DC
  Administered 2016-12-10 (×2): 80 mg via ORAL
  Filled 2016-12-10 (×2): qty 1

## 2016-12-10 MED ORDER — ASPIRIN 81 MG PO CHEW: 81.0000 mg | CHEWABLE_TABLET | ORAL | Status: DC

## 2016-12-10 MED ORDER — TICAGRELOR 90 MG PO TABS
ORAL_TABLET | ORAL | Status: DC | PRN
  Administered 2016-12-10: 180 mg via ORAL

## 2016-12-10 MED ORDER — NITROGLYCERIN 1 MG/10 ML FOR IR/CATH LAB
INTRA_ARTERIAL | Status: DC | PRN
  Administered 2016-12-10: 200 ug via INTRACORONARY

## 2016-12-10 MED ORDER — MIDAZOLAM HCL 2 MG/2ML IJ SOLN
INTRAMUSCULAR | Status: AC
  Filled 2016-12-10: qty 2

## 2016-12-10 MED ORDER — TICAGRELOR 90 MG PO TABS
ORAL_TABLET | ORAL | Status: AC
  Filled 2016-12-10: qty 2

## 2016-12-10 MED ORDER — VERAPAMIL HCL 2.5 MG/ML IV SOLN
INTRAVENOUS | Status: AC
  Filled 2016-12-10: qty 2

## 2016-12-10 MED ORDER — SODIUM CHLORIDE 0.9 % IV SOLN: 250.0000 mL | INTRAVENOUS | Status: DC | PRN

## 2016-12-10 MED ORDER — INSULIN ASPART 100 UNIT/ML ~~LOC~~ SOLN
0.0000 [IU] | Freq: Three times a day (TID) | SUBCUTANEOUS | Status: DC
  Administered 2016-12-10: 9 [IU] via SUBCUTANEOUS
  Administered 2016-12-11: 07:00:00 5 [IU] via SUBCUTANEOUS

## 2016-12-10 MED ORDER — LIDOCAINE HCL (PF) 1 % IJ SOLN
INTRAMUSCULAR | Status: DC | PRN
  Administered 2016-12-10: 2 mL

## 2016-12-10 MED ORDER — DEXTROSE 5 % IV SOLN
1.0000 g | Freq: Every day | INTRAVENOUS | Status: DC
  Administered 2016-12-10 – 2016-12-11 (×2): 1 g via INTRAVENOUS
  Filled 2016-12-10 (×2): qty 10

## 2016-12-10 MED ORDER — LIDOCAINE HCL (PF) 1 % IJ SOLN
INTRAMUSCULAR | Status: AC
  Filled 2016-12-10: qty 30

## 2016-12-10 MED ORDER — NITROGLYCERIN 1 MG/10 ML FOR IR/CATH LAB
INTRA_ARTERIAL | Status: AC
  Filled 2016-12-10: qty 10

## 2016-12-10 MED ORDER — IOPAMIDOL (ISOVUE-370) INJECTION 76%
INTRAVENOUS | Status: AC
  Filled 2016-12-10: qty 100

## 2016-12-10 MED ORDER — HEART ATTACK BOUNCING BOOK
Freq: Once | Status: AC
  Administered 2016-12-10: 20:00:00
  Filled 2016-12-10: qty 1

## 2016-12-10 MED ORDER — HEPARIN (PORCINE) IN NACL 100-0.45 UNIT/ML-% IJ SOLN
800.0000 [IU]/h | INTRAMUSCULAR | Status: DC
  Administered 2016-12-10: 800 [IU]/h via INTRAVENOUS
  Filled 2016-12-10: qty 250

## 2016-12-10 MED ORDER — HYDRALAZINE HCL 20 MG/ML IJ SOLN: 5.0000 mg | INTRAMUSCULAR | Status: AC | PRN

## 2016-12-10 MED ORDER — FENTANYL CITRATE (PF) 100 MCG/2ML IJ SOLN
INTRAMUSCULAR | Status: AC
  Filled 2016-12-10: qty 2

## 2016-12-10 MED ORDER — SODIUM CHLORIDE 0.9% FLUSH: 3.0000 mL | Freq: Two times a day (BID) | INTRAVENOUS | Status: DC

## 2016-12-10 MED ORDER — IOPAMIDOL (ISOVUE-370) INJECTION 76%
INTRAVENOUS | Status: AC
  Filled 2016-12-10: qty 50

## 2016-12-10 MED ORDER — TICAGRELOR 90 MG PO TABS
90.0000 mg | ORAL_TABLET | Freq: Two times a day (BID) | ORAL | Status: DC
  Administered 2016-12-11 (×2): 90 mg via ORAL
  Filled 2016-12-10 (×2): qty 1

## 2016-12-10 MED ORDER — FENTANYL CITRATE (PF) 100 MCG/2ML IJ SOLN
INTRAMUSCULAR | Status: DC | PRN
  Administered 2016-12-10: 25 ug via INTRAVENOUS

## 2016-12-10 SURGICAL SUPPLY — 15 items
BALLN ~~LOC~~ MOZEC 3.5X13 (BALLOONS) ×2
BALLOON ~~LOC~~ MOZEC 3.5X13 (BALLOONS) IMPLANT
CATH EXPO 5F FL3.5 (CATHETERS) ×1 IMPLANT
CATH OPTITORQUE JACKY 4.0 5F (CATHETERS) ×1 IMPLANT
DEVICE RAD COMP TR BAND LRG (VASCULAR PRODUCTS) ×1 IMPLANT
GLIDESHEATH SLEND SS 6F .021 (SHEATH) ×1 IMPLANT
GUIDEWIRE INQWIRE 1.5J.035X260 (WIRE) IMPLANT
INQWIRE 1.5J .035X260CM (WIRE) ×2
KIT ENCORE 26 ADVANTAGE (KITS) ×1 IMPLANT
KIT HEART LEFT (KITS) ×2 IMPLANT
PACK CARDIAC CATHETERIZATION (CUSTOM PROCEDURE TRAY) ×2 IMPLANT
STENT ELUNIR 3.0 X 17 (Permanent Stent) ×1 IMPLANT
TRANSDUCER W/STOPCOCK (MISCELLANEOUS) ×2 IMPLANT
TUBING CIL FLEX 10 FLL-RA (TUBING) ×2 IMPLANT
WIRE RUNTHROUGH .014X180CM (WIRE) ×1 IMPLANT

## 2016-12-10 NOTE — Progress Notes (Signed)
Pharmacy Antibiotic Note  Sally Hendrix is a 61 y.o. female admitted on 12/09/2016 with UTI.  Pharmacy has been consulted for Rocephin dosing.  Plan: Rocephin 1gm IV q24h Pharmacy will sign off of Rocephin dosing and continue to follow for heparin  Height: 5\' 3"  (160 cm) Weight: 149 lb 9.6 oz (67.9 kg) IBW/kg (Calculated) : 52.4  Temp (24hrs), Avg:98.4 F (36.9 C), Min:98 F (36.7 C), Max:98.7 F (37.1 C)   Recent Labs Lab 12/09/16 1720 12/09/16 2023 12/09/16 2201  WBC  --  11.0* 10.4  CREATININE 0.84  --  0.88    Estimated Creatinine Clearance: 61.3 mL/min (by C-G formula based on SCr of 0.88 mg/dL).    No Known Allergies Thank you for allowing pharmacy to be a part of this patient's care.  Sherlon Handing, PharmD, BCPS Clinical pharmacist, pager [phone] 12/10/2016 6:14 AM

## 2016-12-10 NOTE — Progress Notes (Signed)
TR BAND REMOVAL  LOCATION:    right radial  DEFLATED PER PROTOCOL:    Yes.    TIME BAND OFF / DRESSING APPLIED:    2245    SITE UPON ARRIVAL:    Level 0  SITE AFTER BAND REMOVAL:    Level 0  CIRCULATION SENSATION AND MOVEMENT:    Within Normal Limits   Yes.    COMMENTS:   Radial site teaching done

## 2016-12-10 NOTE — Care Management Note (Addendum)
Case Management Note  Patient Details  Name: Sally Hendrix MRN: [id number] Date of Birth: [birth year]  Subjective/Objective:      Chest pain, uncontrolled DM, CHF,  HTN              Action/Plan: Discharge Planning: NCM spoke to pt and states she goes to [place] on [address]. They do have Brilinta in stock. Provided pt with Brilinta 30 day trial card. Placed Brilinta patient assistance application on shadow chart for attending to complete. Explained to pt she will need to mail completed application with info on her income or copy of filed tax paperwork. States she does not currently work. Lives in home with husband. Pt pays out of pocket for PCP appts, and uses meds on [facility] $4.00 list.   PCP Leanna Battles    Expected Discharge Date:  12/11/16               Expected Discharge Plan:  Home/Self Care  In-House Referral:  NA  Discharge planning Services  CM Consult, Medication Assistance  Post Acute Care Choice:  NA Choice offered to:  NA  DME Arranged:  N/A DME Agency:  NA  HH Arranged:  NA HH Agency:  NA  Status of Service:  In process, will continue to follow  If discussed at Long Length of Stay Meetings, dates discussed:    Additional Comments:  Erenest Rasher, RN 12/10/2016, 6:06 PM

## 2016-12-10 NOTE — H&P (View-Only) (Signed)
Cardiology Consult    Patient ID: Sally Hendrix MRN: [id number], DOB/AGE: 1955-12-28   Admit date: 12/09/2016 Date of Consult: 12/10/2016  Primary Physician: Donnajean Lopes, MD Primary Cardiologist: Bensimhon  Requesting Provider: Eliseo Squires Reason for Consultation: Chest pain  Patient Profile    60 yo female with PMH of NICM, NIDDM, obesity, HTN who presented with chest pain.   Past Medical History   Past Medical History:  Diagnosis Date  . CHF (congestive heart failure) ([place])   . Diabetes mellitus   . Hypertension   . Nonischemic cardiomyopathy ([place])   . Obesity   . Sleep apnea     Past Surgical History:  Procedure Laterality Date  . CARDIAC CATHETERIZATION       Allergies  No Known Allergies  History of Present Illness    Sally Hendrix is a 61 yo female with PMH of NICM, NIDDM, obesity, OSA, HTN. She admitted in 12/11 with acute HF (15%) in the setting of elevated BP. Underwent cath that showed normal coronaries. She was diuresed and started on medical therapy. Followed in the office and noted to have improvement in EF to 60%.   Reports she has been followed by her PCP for blood pressure and routine physicals. Admits that she has not been taking her antihypertensives on a regular basis because she has been feeling well and thought she did not need them. Normal pretty physical active cares for her grandchildren and walks daily. Does not normally experience anginal symptoms. Yesterday around 12:30pm experienced a sudden onset of left chest pain near her axilla that radiated down into the left arm. She rested for awhile, and took a nap but the pain continued. Presented to [facility] and blood pressure noted at 742 systolic. Given 1 SL nitro which resolved the pain, but blood pressure remained elevated. She was sent to the ED for further work up.    In the ED labs showed stable electrolytes, BNP normal, Trop 0.09>>0.14>>1.1>>1.9. Hgb 14. CXR neg. UA positive for UTI. EKG showed ST with no  acute ST/T wave changes. She was started on IV heparin.   Inpatient Medications    . aspirin EC  325 mg Oral Daily  . atorvastatin  80 mg Oral q1800  . carvedilol  25 mg Oral BID WC  . cefTRIAXone (ROCEPHIN)  IV  1 g Intravenous Q0600  . digoxin  125 mcg Oral Daily  . gabapentin  300 mg Oral QHS  . insulin aspart  0-9 Units Subcutaneous TID WC  . losartan  100 mg Oral Daily    Family History    Family History  Problem Relation Age of Onset  . Heart failure Mother     hx of - died of nonalcoholic cirrhosis  . Coronary artery disease Father 61    alive  . Other Brother     brain tumor    Social History    Social History   Social History  . Marital status: Married    Spouse name: N/A  . Number of children: 2  . Years of education: N/A   Occupational History  . real state broker [place] History Main Topics  . Smoking status: Former Research scientist (life sciences)  . Smokeless tobacco: Never Used  . Alcohol use Yes     Comment: occasional  . Drug use: No  . Sexual activity: Not on file   Other Topics Concern  . Not on file   Social History Narrative  . No narrative on  file     Review of Systems    General:  No chills, fever, night sweats or weight changes.  Cardiovascular: See HPI Dermatological: No rash, lesions/masses Respiratory: No cough, dyspnea Urologic: No hematuria, dysuria Abdominal:   No nausea, vomiting, diarrhea, bright red blood per rectum, melena, or hematemesis Neurologic:  No visual changes, wkns, changes in mental status. All other systems reviewed and are otherwise negative except as noted above.  Physical Exam    Blood pressure 132/80, pulse 98, temperature 98 F (36.7 C), temperature source Oral, resp. rate 18, height 5\' 3"  (1.6 m), weight 149 lb 9.6 oz (67.9 kg), SpO2 96 %.  General: Pleasant older WF, NAD Psych: Normal affect. Neuro: Alert and oriented X 3. Moves all extremities spontaneously. HEENT: Normal  Neck: Supple without  bruits or JVD. Lungs:  Resp regular and unlabored, CTA. Heart: RRR no s3, s4, or murmurs. Abdomen: Soft, non-tender, non-distended, BS + x 4.  Extremities: No clubbing, cyanosis or edema. DP/PT/Radials 2+ and equal bilaterally.  Labs    Troponin (Point of Care Test) No results for input(s): TROPIPOC in the last 72 hours.  Recent Labs  12/09/16 1720 12/09/16 2023 12/09/16 2201 12/10/16 0402  TROPONINI 0.03* 0.09* 0.14* 1.19*   Lab Results  Component Value Date   WBC 10.4 12/09/2016   HGB 14.7 12/09/2016   HCT 42.4 12/09/2016   MCV 84.8 12/09/2016   PLT 254 12/09/2016    Recent Labs Lab 12/09/16 1720 12/09/16 2201  NA 134*  --   K 4.1  --   CL 97*  --   CO2 28  --   BUN 15  --   CREATININE 0.84 0.88  CALCIUM 9.2  --   PROT 6.7  --   BILITOT 0.9  --   ALKPHOS 119  --   ALT 14  --   AST 27  --   GLUCOSE 365*  --    Lab Results  Component Value Date   CHOL (H) 09/22/2010    212        ATP III CLASSIFICATION:  <200     mg/dL   Desirable  200-239  mg/dL   Borderline High  >=240    mg/dL   High          HDL 37 (L) 09/22/2010   LDLCALC (H) 09/22/2010    126        Total Cholesterol/HDL:CHD Risk Coronary Heart Disease Risk Table                     Men   Women  1/2 Average Risk   3.4   3.3  Average Risk       5.0   4.4  2 X Average Risk   9.6   7.1  3 X Average Risk  23.4   11.0        Use the calculated Patient Ratio above and the CHD Risk Table to determine the patient's CHD Risk.        ATP III CLASSIFICATION (LDL):  <100     mg/dL   Optimal  100-129  mg/dL   Near or Above                    Optimal  130-159  mg/dL   Borderline  160-189  mg/dL   High  >190     mg/dL   Very High   TRIG 246 (H) 09/22/2010   No results found  for: DDIMER   Radiology Studies    Dg Chest 2 View  Result Date: 12/09/2016 CLINICAL DATA:  Left-sided chest pain radiating down the left arm, some shortness of breath EXAM: CHEST  2 VIEW COMPARISON:  Chest x-ray of  09/23/2010 FINDINGS: No definite active infiltrate or effusion is seen. Mediastinal and hilar contours are unremarkable. Mild cardiomegaly is stable. The only questionable abnormality is deep within the lung posterior sulcus on the lateral view. This area of opacity probably is due to overlapping bony structures, but a subtle lung lesion would be very difficult to exclude. Consider either followup chest x-ray or CT of the chest to assess further. IMPRESSION: 1. No definite active process. 2. Opacity deep in the posterior lung sulcus only on the lateral view probably due to overlapping bony structures but recommend attention to this area on followup chest x-ray. Electronically Signed   By: Ivar Drape M.D.   On: 12/09/2016 16:58    ECG & Cardiac Imaging    EKG: SR  Echo: 7/12  Study Conclusions Left ventricle: THERE HAS BEEN DEFINITE, DRAMATIC IMPROVEMENT IN LV FUNCTION SINCE THE STUDY OF 09/2010. The cavity size was normal. Wall thickness was normal. The estimated ejection fraction was 60%. Wall motion was normal; there were no regional wall motion abnormalities. Doppler parameters are consistent with abnormal left ventricular relaxation (grade 1 diastolic dysfunction).  Assessment & Plan    61 yo female with PMH of NICM, NIDDM, obesity, HTN who presented with chest pain.   1. NSTEMI: Reports developing a sudden onset of left chest pain with radiation into the left arm. Was relieved by nitro, but blood pressure remained elevated. Did undergo cath in 2011 that showed norm cors, and EF improved with medical therapy. Suspect this is related to her noncompliance with home medications, but does have elevated trop. The patient understands that risks included but are not limited to stroke (1 in 1000), death (1 in 48), kidney failure [usually temporary] (1 in 500), bleeding (1 in 200), allergic reaction [possibly serious] (1 in 200).  -- will plan for LHC this afternoon.  -- continue IV heparin,  BB, statin, ASA, ARB -- echo pending -- check lipids  2. NICM: Improved to normal on last echo back in 2012 -- echo pending  3. NIDDM: CBGs uncontrolled. Hgb A1c pending  4. HTN: Was not taking her home medications as Rx. Blood pressure was 470 systolic on admission, but has since improved.  -- continue home regimen  5. UTI: Antibiotics per primary   Signed, Reino Bellis, NP-C Pager [phone] 12/10/2016, 10:49 AM   Attending Note:   The patient was seen and examined.  Agree with assessment and plan as noted above.  Changes made to the above note as needed.  Patient seen and independently examined with Reino Bellis, NP .   We discussed all aspects of the encounter. I agree with the assessment and plan as stated above.  1.  Acute on chronic systolic CHF:  Hx of CHF in the past.   EF had improved signficantly with proper CHF meds.  She has not been compliant with her meds and she now presents with marked HTN and + trooponins. BP has improved.  Will arrange for a cath today .  2. Essential HTN:   She has not been compliant with her meds.  Restart meds .     I have spent a total of 40 minutes with patient reviewing hospital  notes , telemetry, EKGs, labs and examining  patient as well as establishing an assessment and plan that was discussed with the patient. > 50% of time was spent in direct patient care.    Thayer Headings, Brooke Bonito., MD, [facility] 12/10/2016, 2:27 PM [address]  [place] Pager [phone]

## 2016-12-10 NOTE — Consult Note (Signed)
Cardiology Consult    Patient ID: Sally Hendrix MRN: [id number], DOB/AGE: June 04, 1956   Admit date: 12/09/2016 Date of Consult: 12/10/2016  Primary Physician: Donnajean Lopes, MD Primary Cardiologist: Bensimhon  Requesting Provider: Eliseo Squires Reason for Consultation: Chest pain  Patient Profile    61 yo female with PMH of NICM, NIDDM, obesity, HTN who presented with chest pain.   Past Medical History   Past Medical History:  Diagnosis Date  . CHF (congestive heart failure) ([place])   . Diabetes mellitus   . Hypertension   . Nonischemic cardiomyopathy ([place])   . Obesity   . Sleep apnea     Past Surgical History:  Procedure Laterality Date  . CARDIAC CATHETERIZATION       Allergies  No Known Allergies  History of Present Illness    Sally Hendrix is a 61 yo female with PMH of NICM, NIDDM, obesity, OSA, HTN. She admitted in 12/11 with acute HF (15%) in the setting of elevated BP. Underwent cath that showed normal coronaries. She was diuresed and started on medical therapy. Followed in the office and noted to have improvement in EF to 60%.   Reports she has been followed by her PCP for blood pressure and routine physicals. Admits that she has not been taking her antihypertensives on a regular basis because she has been feeling well and thought she did not need them. Normal pretty physical active cares for her grandchildren and walks daily. Does not normally experience anginal symptoms. Yesterday around 12:30pm experienced a sudden onset of left chest pain near her axilla that radiated down into the left arm. She rested for awhile, and took a nap but the pain continued. Presented to [facility] and blood pressure noted at 235 systolic. Given 1 SL nitro which resolved the pain, but blood pressure remained elevated. She was sent to the ED for further work up.    In the ED labs showed stable electrolytes, BNP normal, Trop 0.09>>0.14>>1.1>>1.9. Hgb 14. CXR neg. UA positive for UTI. EKG showed ST with no  acute ST/T wave changes. She was started on IV heparin.   Inpatient Medications    . aspirin EC  325 mg Oral Daily  . atorvastatin  80 mg Oral q1800  . carvedilol  25 mg Oral BID WC  . cefTRIAXone (ROCEPHIN)  IV  1 g Intravenous Q0600  . digoxin  125 mcg Oral Daily  . gabapentin  300 mg Oral QHS  . insulin aspart  0-9 Units Subcutaneous TID WC  . losartan  100 mg Oral Daily    Family History    Family History  Problem Relation Age of Onset  . Heart failure Mother     hx of - died of nonalcoholic cirrhosis  . Coronary artery disease Father 59    alive  . Other Brother     brain tumor    Social History    Social History   Social History  . Marital status: Married    Spouse name: N/A  . Number of children: 2  . Years of education: N/A   Occupational History  . 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 Smoking status: Former Research scientist (life sciences)  . Smokeless tobacco: Never Used  . Alcohol use Yes     Comment: occasional  . Drug use: No  . Sexual activity: Not on file   Other Topics Concern  . Not on file   Social History Narrative  . No narrative on  file     Review of Systems    General:  No chills, fever, night sweats or weight changes.  Cardiovascular: See HPI Dermatological: No rash, lesions/masses Respiratory: No cough, dyspnea Urologic: No hematuria, dysuria Abdominal:   No nausea, vomiting, diarrhea, bright red blood per rectum, melena, or hematemesis Neurologic:  No visual changes, wkns, changes in mental status. All other systems reviewed and are otherwise negative except as noted above.  Physical Exam    Blood pressure 132/80, pulse 98, temperature 98 F (36.7 C), temperature source Oral, resp. rate 18, height 5\' 3"  (1.6 m), weight 149 lb 9.6 oz (67.9 kg), SpO2 96 %.  General: Pleasant older WF, NAD Psych: Normal affect. Neuro: Alert and oriented X 3. Moves all extremities spontaneously. HEENT: Normal  Neck: Supple without  bruits or JVD. Lungs:  Resp regular and unlabored, CTA. Heart: RRR no s3, s4, or murmurs. Abdomen: Soft, non-tender, non-distended, BS + x 4.  Extremities: No clubbing, cyanosis or edema. DP/PT/Radials 2+ and equal bilaterally.  Labs    Troponin (Point of Care Test) No results for input(s): TROPIPOC in the last 72 hours.  Recent Labs  12/09/16 1720 12/09/16 2023 12/09/16 2201 12/10/16 0402  TROPONINI 0.03* 0.09* 0.14* 1.19*   Lab Results  Component Value Date   WBC 10.4 12/09/2016   HGB 14.7 12/09/2016   HCT 42.4 12/09/2016   MCV 84.8 12/09/2016   PLT 254 12/09/2016    Recent Labs Lab 12/09/16 1720 12/09/16 2201  NA 134*  --   K 4.1  --   CL 97*  --   CO2 28  --   BUN 15  --   CREATININE 0.84 0.88  CALCIUM 9.2  --   PROT 6.7  --   BILITOT 0.9  --   ALKPHOS 119  --   ALT 14  --   AST 27  --   GLUCOSE 365*  --    Lab Results  Component Value Date   CHOL (H) 09/22/2010    212        ATP III CLASSIFICATION:  <200     mg/dL   Desirable  200-239  mg/dL   Borderline High  >=240    mg/dL   High          HDL 37 (L) 09/22/2010   LDLCALC (H) 09/22/2010    126        Total Cholesterol/HDL:CHD Risk Coronary Heart Disease Risk Table                     Men   Women  1/2 Average Risk   3.4   3.3  Average Risk       5.0   4.4  2 X Average Risk   9.6   7.1  3 X Average Risk  23.4   11.0        Use the calculated Patient Ratio above and the CHD Risk Table to determine the patient's CHD Risk.        ATP III CLASSIFICATION (LDL):  <100     mg/dL   Optimal  100-129  mg/dL   Near or Above                    Optimal  130-159  mg/dL   Borderline  160-189  mg/dL   High  >190     mg/dL   Very High   TRIG 246 (H) 09/22/2010   No results found  for: DDIMER   Radiology Studies    Dg Chest 2 View  Result Date: 12/09/2016 CLINICAL DATA:  Left-sided chest pain radiating down the left arm, some shortness of breath EXAM: CHEST  2 VIEW COMPARISON:  Chest x-ray of  09/23/2010 FINDINGS: No definite active infiltrate or effusion is seen. Mediastinal and hilar contours are unremarkable. Mild cardiomegaly is stable. The only questionable abnormality is deep within the lung posterior sulcus on the lateral view. This area of opacity probably is due to overlapping bony structures, but a subtle lung lesion would be very difficult to exclude. Consider either followup chest x-ray or CT of the chest to assess further. IMPRESSION: 1. No definite active process. 2. Opacity deep in the posterior lung sulcus only on the lateral view probably due to overlapping bony structures but recommend attention to this area on followup chest x-ray. Electronically Signed   By: Ivar Drape M.D.   On: 12/09/2016 16:58    ECG & Cardiac Imaging    EKG: SR  Echo: 7/12  Study Conclusions Left ventricle: THERE HAS BEEN DEFINITE, DRAMATIC IMPROVEMENT IN LV FUNCTION SINCE THE STUDY OF 09/2010. The cavity size was normal. Wall thickness was normal. The estimated ejection fraction was 60%. Wall motion was normal; there were no regional wall motion abnormalities. Doppler parameters are consistent with abnormal left ventricular relaxation (grade 1 diastolic dysfunction).  Assessment & Plan    61 yo female with PMH of NICM, NIDDM, obesity, HTN who presented with chest pain.   1. NSTEMI: Reports developing a sudden onset of left chest pain with radiation into the left arm. Was relieved by nitro, but blood pressure remained elevated. Did undergo cath in 2011 that showed norm cors, and EF improved with medical therapy. Suspect this is related to her noncompliance with home medications, but does have elevated trop. The patient understands that risks included but are not limited to stroke (1 in 1000), death (1 in 59), kidney failure [usually temporary] (1 in 500), bleeding (1 in 200), allergic reaction [possibly serious] (1 in 200).  -- will plan for LHC this afternoon.  -- continue IV heparin,  BB, statin, ASA, ARB -- echo pending -- check lipids  2. NICM: Improved to normal on last echo back in 2012 -- echo pending  3. NIDDM: CBGs uncontrolled. Hgb A1c pending  4. HTN: Was not taking her home medications as Rx. Blood pressure was 160 systolic on admission, but has since improved.  -- continue home regimen  5. UTI: Antibiotics per primary   Signed, Reino Bellis, NP-C Pager [phone] 12/10/2016, 10:49 AM   Attending Note:   The patient was seen and examined.  Agree with assessment and plan as noted above.  Changes made to the above note as needed.  Patient seen and independently examined with Reino Bellis, NP .   We discussed all aspects of the encounter. I agree with the assessment and plan as stated above.  1.  Acute on chronic systolic CHF:  Hx of CHF in the past.   EF had improved signficantly with proper CHF meds.  She has not been compliant with her meds and she now presents with marked HTN and + trooponins. BP has improved.  Will arrange for a cath today .  2. Essential HTN:   She has not been compliant with her meds.  Restart meds .     I have spent a total of 40 minutes with patient reviewing hospital  notes , telemetry, EKGs, labs and examining  patient as well as establishing an assessment and plan that was discussed with the patient. > 50% of time was spent in direct patient care.    Thayer Headings, Brooke Bonito., MD, [facility] 12/10/2016, 2:27 PM [address]  [place] Pager [phone]

## 2016-12-10 NOTE — Progress Notes (Signed)
Inpatient Diabetes Program Recommendations  AACE/ADA: New Consensus Statement on Inpatient Glycemic Control (2015)  Target Ranges:  Prepandial:   less than 140 mg/dL      Peak postprandial:   less than 180 mg/dL (1-2 hours)      Critically ill patients:  140 - 180 mg/dL   Lab Results  Component Value Date   GLUCAP 300 (H) 12/10/2016   HGBA1C 9.1 (H) 03/31/2011    Review of Glycemic Control Results for Sally Hendrix, Sally Hendrix (MRN [id number]) as of 12/10/2016 10:28  Ref. Range 12/09/2016 23:04 12/10/2016 06:11  Glucose-Capillary Latest Ref Range: 65 - 99 mg/dL 353 (H) 300 (H)   Diabetes history: DM2 Outpatient Diabetes medications: Amaryl 2 mg qd + Metformin 500 mg bid (noted not taken for 10 days preadmission) Current orders for Inpatient glycemic control: Novolog correction 0-9 tid  Inpatient Diabetes Program Recommendations: While oral meds held, please consider: -Lantus 14 units daily ( 0.2 units/kg x 67.9 kg) -A1c to determine prehospital glycemic control -Increase Novolog correction to 0-15 units tid + 0-5 units hs  Thank you, Bethena Roys E. Jenaya Saar, RN, MSN, CDE Inpatient Glycemic Control Team Team Pager [phone] (8am-5pm) 12/10/2016 10:36 AM

## 2016-12-10 NOTE — Progress Notes (Signed)
Troponin elevation called. MD on call notified.Patient asymptomatic.

## 2016-12-10 NOTE — Progress Notes (Signed)
PROGRESS NOTE    Sally Hendrix  TKZ:[id number] DOB: 1956-08-14 DOA: 12/09/2016 PCP: Donnajean Lopes, MD   Outpatient Specialists:     Brief Narrative:  Sally Hendrix is a 61 y.o. female with history of nonischemic cardiomyopathy, hypertension and diabetes mellitus presents to the ER because of chest pain. Patient started experience chest pain yesterday afternoon which was retrosternal and lasted for a few minutes and resolved after patient took some aspirin. Then patient took a small nap and woke up and started experiencing chest pain again and at this time the pain was radiating to his left arm. Patient took aspirin again. Patient was brought to the ER by patient's daughter.    Assessment & Plan:   Principal Problem:   Chest pain Active Problems:   Chronic systolic heart failure (HCC)   Uncontrolled type 2 diabetes mellitus with hyperglycemia (HCC)   Hypertensive urgency   Chest pain  -concerning for unstable angina. -troponin is positive.  -chest pain-free at this time.  -heparin gtt -cardiology consult  Uncontrolled diabetes mellitus type 2 -probably secondary to noncompliance with medications -hemoglobin A1c pending -holding home PO meds -SSI -depending on cardiology plans may start lantus 14 units  Hypertensive urgency  -patient has not been taking medications at home - resume home medications   Chronic systolic heart failure  -patient's EF in 2011 was around 10% and in 2012 was 60% -echo pending this visit  Abnormal opacity in chest x-ray will need follow-up as outpatient.    UTI  -culture pending  -rocephin ordered   DVT prophylaxis:  Fully anticoagulated   Code Status: Full Code   Family Communication: patient  Disposition Plan:     Consultants:   cards  Procedures:   Echo pending     Subjective: Chest pain free Has not been taking BP meds at home  Objective: Vitals:   12/09/16 2145 12/09/16 2228 12/10/16 0411 12/10/16 0618   BP: 148/93 (!) 165/94 (!) 163/95 132/80  Pulse: 91 90 91 98  Resp: 16 18 18    Temp:  98.6 F (37 C) 98 F (36.7 C)   TempSrc:  Oral Oral   SpO2: 96% 96% 96%   Weight:  67 kg (147 lb 11.2 oz) 67.9 kg (149 lb 9.6 oz)   Height:  5\' 3"  (1.6 m)     No intake or output data in the 24 hours ending 12/10/16 1124 Filed Weights   12/09/16 1623 12/09/16 2228 12/10/16 0411  Weight: 67.6 kg (149 lb) 67 kg (147 lb 11.2 oz) 67.9 kg (149 lb 9.6 oz)    Examination:  General exam: Appears calm and comfortable  Respiratory system: Clear to auscultation. Respiratory effort normal. Cardiovascular system: S1 & S2 heard, RRR. No JVD, murmurs, rubs, gallops or clicks. No pedal edema. Gastrointestinal system: Abdomen is nondistended, soft and nontender. No organomegaly or masses felt. Normal bowel sounds heard. Central nervous system: Alert and oriented. No focal neurological deficits.    Data Reviewed: I have personally reviewed following labs and imaging studies  CBC:  Recent Labs Lab 12/09/16 2023 12/09/16 2201  WBC 11.0* 10.4  NEUTROABS 7.1  --   HGB 14.4 14.7  HCT 42.0 42.4  MCV 84.8 84.8  PLT 264 573   Basic Metabolic Panel:  Recent Labs Lab 12/09/16 1720 12/09/16 2201  NA 134*  --   K 4.1  --   CL 97*  --   CO2 28  --   GLUCOSE 365*  --  BUN 15  --   CREATININE 0.84 0.88  CALCIUM 9.2  --    GFR: Estimated Creatinine Clearance: 61.3 mL/min (by C-G formula based on SCr of 0.88 mg/dL). Liver Function Tests:  Recent Labs Lab 12/09/16 1720  AST 27  ALT 14  ALKPHOS 119  BILITOT 0.9  PROT 6.7  ALBUMIN 3.6   No results for input(s): LIPASE, AMYLASE in the last 168 hours. No results for input(s): AMMONIA in the last 168 hours. Coagulation Profile: No results for input(s): INR, PROTIME in the last 168 hours. Cardiac Enzymes:  Recent Labs Lab 12/09/16 1720 12/09/16 2023 12/09/16 2201 12/10/16 0402 12/10/16 0943  TROPONINI 0.03* 0.09* 0.14* 1.19* 1.94*   BNP  (last 3 results) No results for input(s): PROBNP in the last 8760 hours. HbA1C: No results for input(s): HGBA1C in the last 72 hours. CBG:  Recent Labs Lab 12/09/16 2304 12/10/16 0611  GLUCAP 353* 300*   Lipid Profile: No results for input(s): CHOL, HDL, LDLCALC, TRIG, CHOLHDL, LDLDIRECT in the last 72 hours. Thyroid Function Tests: No results for input(s): TSH, T4TOTAL, FREET4, T3FREE, THYROIDAB in the last 72 hours. Anemia Panel: No results for input(s): VITAMINB12, FOLATE, FERRITIN, TIBC, IRON, RETICCTPCT in the last 72 hours. Urine analysis:    Component Value Date/Time   COLORURINE YELLOW 12/09/2016 2023   APPEARANCEUR CLOUDY (A) 12/09/2016 2023   LABSPEC 1.021 12/09/2016 2023   PHURINE 6.0 12/09/2016 2023   GLUCOSEU >=500 (A) 12/09/2016 2023   HGBUR NEGATIVE 12/09/2016 2023   BILIRUBINUR NEGATIVE 12/09/2016 2023   KETONESUR 5 (A) 12/09/2016 2023   PROTEINUR 100 (A) 12/09/2016 2023   NITRITE POSITIVE (A) 12/09/2016 2023   LEUKOCYTESUR TRACE (A) 12/09/2016 2023     )No results found for this or any previous visit (from the past 240 hour(s)).    Anti-infectives    Start     Dose/Rate Route Frequency Ordered Stop   12/10/16 0630  cefTRIAXone (ROCEPHIN) 1 g in dextrose 5 % 50 mL IVPB     1 g 100 mL/hr over 30 Minutes Intravenous Daily 12/10/16 0615     12/09/16 2145  sulfamethoxazole-trimethoprim (BACTRIM DS,SEPTRA DS) 800-160 MG per tablet 1 tablet  Status:  Discontinued     1 tablet Oral  Once 12/09/16 2136 12/09/16 2201       Radiology Studies: Dg Chest 2 View  Result Date: 12/09/2016 CLINICAL DATA:  Left-sided chest pain radiating down the left arm, some shortness of breath EXAM: CHEST  2 VIEW COMPARISON:  Chest x-ray of 09/23/2010 FINDINGS: No definite active infiltrate or effusion is seen. Mediastinal and hilar contours are unremarkable. Mild cardiomegaly is stable. The only questionable abnormality is deep within the lung posterior sulcus on the lateral  view. This area of opacity probably is due to overlapping bony structures, but a subtle lung lesion would be very difficult to exclude. Consider either followup chest x-ray or CT of the chest to assess further. IMPRESSION: 1. No definite active process. 2. Opacity deep in the posterior lung sulcus only on the lateral view probably due to overlapping bony structures but recommend attention to this area on followup chest x-ray. Electronically Signed   By: Ivar Drape M.D.   On: 12/09/2016 16:58        Scheduled Meds: . aspirin EC  325 mg Oral Daily  . atorvastatin  80 mg Oral q1800  . carvedilol  25 mg Oral BID WC  . cefTRIAXone (ROCEPHIN)  IV  1 g Intravenous Q0600  .  digoxin  125 mcg Oral Daily  . gabapentin  300 mg Oral QHS  . insulin aspart  0-9 Units Subcutaneous TID WC  . losartan  100 mg Oral Daily   Continuous Infusions: . heparin 800 Units/hr (12/10/16 2979)     LOS: 0 days    Time spent: 25 min    [place], DO [facility] Pager [phone]  If 7PM-7AM, please contact night-coverage [email or web address] Password [facility] 12/10/2016, 11:24 AM

## 2016-12-10 NOTE — Interval H&P Note (Signed)
Cath Lab Visit (complete for each Cath Lab visit)  Clinical Evaluation Leading to the Procedure:   ACS: Yes.    Non-ACS:  n/a     History and Physical Interval Note:  12/10/2016 4:11 PM  Sally Hendrix  has presented today for surgery, with the diagnosis of n stemi  The various methods of treatment have been discussed with the patient and family. After consideration of risks, benefits and other options for treatment, the patient has consented to  Procedure(s): Left Heart Cath and Coronary Angiography (N/A) as a surgical intervention .  The patient's history has been reviewed, patient examined, no change in status, stable for surgery.  I have reviewed the patient's chart and labs.  Questions were answered to the patient's satisfaction.     Kathlyn [place]

## 2016-12-10 NOTE — Progress Notes (Signed)
ANTICOAGULATION CONSULT NOTE - Initial Consult  Pharmacy Consult for Heparin Indication: chest pain/ACS  No Known Allergies  Patient Measurements: Height: 5\' 3"  (160 cm) Weight: 149 lb 9.6 oz (67.9 kg) IBW/kg (Calculated) : 52.4  Vital Signs: Temp: 98 F (36.7 C) (03/07 0411) Temp Source: Oral (03/07 0411) BP: 163/95 (03/07 0411) Pulse Rate: 91 (03/07 0411)  Labs:  Recent Labs  12/09/16 1720 12/09/16 2023 12/09/16 2201 12/10/16 0402  HGB  --  14.4 14.7  --   HCT  --  42.0 42.4  --   PLT  --  264 254  --   CREATININE 0.84  --  0.88  --   TROPONINI 0.03* 0.09* 0.14* 1.19*    Estimated Creatinine Clearance: 61.3 mL/min (by C-G formula based on SCr of 0.88 mg/dL).   Medical History: Past Medical History:  Diagnosis Date  . CHF (congestive heart failure) ([place])   . Diabetes mellitus   . Hypertension   . Nonischemic cardiomyopathy ([place])   . Obesity   . Sleep apnea     Medications:  Prescriptions Prior to Admission  Medication Sig Dispense Refill Last Dose  . aspirin EC 81 MG tablet Take 324 mg by mouth once.   12/09/2016 at Unknown time  . carvedilol (COREG) 25 MG tablet Take 1 tablet (25 mg total) by mouth 2 (two) times daily with a meal. 60 tablet 6 Past Week at Unknown time  . digoxin (LANOXIN) 0.125 MG tablet Take 1 tablet (125 mcg total) by mouth daily. 30 tablet 6 Past Week at Unknown time  . furosemide (LASIX) 20 MG tablet Take 1 tablet (20 mg total) by mouth every other day. 30 tablet 6 Past Month at Unknown time  . gabapentin (NEURONTIN) 300 MG capsule Take 300 mg by mouth at bedtime.   12/08/2016 at Unknown time  . glimepiride (AMARYL) 2 MG tablet Take 2 mg by mouth daily with breakfast.   Past Month at Unknown time  . ibuprofen (ADVIL,MOTRIN) 200 MG tablet Take 800 mg by mouth every 6 (six) hours as needed for moderate pain.   Past Week at Unknown time  . losartan (COZAAR) 100 MG tablet Take 1 tablet (100 mg total) by mouth daily. 30 tablet 6 Past Week at  Unknown time  . metFORMIN (GLUCOPHAGE) 1000 MG tablet Take 500 mg by mouth 2 (two) times daily with a meal.    Past Month at Unknown time  . aspirin 81 MG tablet Take 81 mg by mouth daily.    Not Taking at Unknown time    Assessment: 61 y.o. female with chest pain/elevated cardiac markers for heparin Goal of Therapy:  Heparin level 0.3-0.7 units/ml Monitor platelets by anticoagulation protocol: Yes   Plan:  Heparin 3000 units IV bolus, then start heparin 800 units/hr Check heparin level in 6 hours.   Caryl Pina 12/10/2016,6:00 AM

## 2016-12-11 ENCOUNTER — Inpatient Hospital Stay (HOSPITAL_COMMUNITY): Payer: Self-pay

## 2016-12-11 ENCOUNTER — Encounter (HOSPITAL_COMMUNITY): Payer: Self-pay | Admitting: Cardiovascular Disease

## 2016-12-11 DIAGNOSIS — R079 Chest pain, unspecified: Secondary | ICD-10-CM

## 2016-12-11 DIAGNOSIS — I5023 Acute on chronic systolic (congestive) heart failure: Secondary | ICD-10-CM

## 2016-12-11 DIAGNOSIS — E1165 Type 2 diabetes mellitus with hyperglycemia: Secondary | ICD-10-CM

## 2016-12-11 DIAGNOSIS — I214 Non-ST elevation (NSTEMI) myocardial infarction: Principal | ICD-10-CM

## 2016-12-11 LAB — BASIC METABOLIC PANEL
Anion gap: 11 (ref 5–15)
BUN: 18 mg/dL (ref 6–20)
CALCIUM: 9.1 mg/dL (ref 8.9–10.3)
CHLORIDE: 102 mmol/L (ref 101–111)
CO2: 24 mmol/L (ref 22–32)
CREATININE: 1.03 mg/dL — AB (ref 0.44–1.00)
GFR calc non Af Amer: 57 mL/min — ABNORMAL LOW (ref >60)
Glucose, Bld: 240 mg/dL — ABNORMAL HIGH (ref 65–99)
Potassium: 3.5 mmol/L (ref 3.5–5.1)
SODIUM: 137 mmol/L (ref 135–145)

## 2016-12-11 LAB — GLUCOSE, CAPILLARY: GLUCOSE-CAPILLARY: 273 mg/dL — AB (ref 65–99)

## 2016-12-11 LAB — CBC
HEMATOCRIT: 40.1 % (ref 36.0–46.0)
HEMOGLOBIN: 13.4 g/dL (ref 12.0–15.0)
MCH: 28.6 pg (ref 26.0–34.0)
MCHC: 33.4 g/dL (ref 30.0–36.0)
MCV: 85.5 fL (ref 78.0–100.0)
Platelets: 243 10*3/uL (ref 150–400)
RBC: 4.69 MIL/uL (ref 3.87–5.11)
RDW: 13.7 % (ref 11.5–15.5)
WBC: 9.2 10*3/uL (ref 4.0–10.5)

## 2016-12-11 LAB — ECHOCARDIOGRAM COMPLETE
HEIGHTINCHES: 63 in
Weight: 2426.82 oz

## 2016-12-11 LAB — HEMOGLOBIN A1C
Hgb A1c MFr Bld: 12.3 % — ABNORMAL HIGH (ref 4.8–5.6)
Mean Plasma Glucose: 306 mg/dL

## 2016-12-11 MED ORDER — SPIRONOLACTONE 25 MG PO TABS: 25.0000 mg | ORAL_TABLET | Freq: Every day | ORAL | Status: DC

## 2016-12-11 MED ORDER — CARVEDILOL 25 MG PO TABS: 25.0000 mg | ORAL_TABLET | Freq: Two times a day (BID) | ORAL | 6 refills | Status: DC

## 2016-12-11 MED ORDER — FUROSEMIDE 20 MG PO TABS: 20.0000 mg | ORAL_TABLET | Freq: Every day | ORAL | Status: DC

## 2016-12-11 MED ORDER — METFORMIN HCL 1000 MG PO TABS: 1000.0000 mg | ORAL_TABLET | Freq: Two times a day (BID) | ORAL | 3 refills | Status: DC

## 2016-12-11 MED ORDER — CEPHALEXIN 500 MG PO CAPS: 500.0000 mg | ORAL_CAPSULE | Freq: Two times a day (BID) | ORAL | 0 refills | Status: DC

## 2016-12-11 MED ORDER — NITROGLYCERIN 0.4 MG SL SUBL: 0.4000 mg | SUBLINGUAL_TABLET | SUBLINGUAL | 12 refills | Status: DC | PRN

## 2016-12-11 MED ORDER — ASPIRIN 81 MG PO TABS: 81.0000 mg | ORAL_TABLET | Freq: Every day | ORAL | 3 refills | Status: DC

## 2016-12-11 MED ORDER — TICAGRELOR 90 MG PO TABS: 90.0000 mg | ORAL_TABLET | Freq: Two times a day (BID) | ORAL | 0 refills | Status: DC

## 2016-12-11 MED ORDER — SPIRONOLACTONE 25 MG PO TABS: 25.0000 mg | ORAL_TABLET | Freq: Every day | ORAL | 4 refills | Status: DC

## 2016-12-11 MED ORDER — TICAGRELOR 90 MG PO TABS: 90.0000 mg | ORAL_TABLET | Freq: Two times a day (BID) | ORAL | 11 refills | Status: DC

## 2016-12-11 MED ORDER — FUROSEMIDE 20 MG PO TABS: 20.0000 mg | ORAL_TABLET | ORAL | 6 refills | Status: DC

## 2016-12-11 MED ORDER — FUROSEMIDE 20 MG PO TABS: 20.0000 mg | ORAL_TABLET | Freq: Every day | ORAL | 6 refills | Status: DC

## 2016-12-11 MED ORDER — DIGOXIN 125 MCG PO TABS: 125.0000 ug | ORAL_TABLET | Freq: Every day | ORAL | 6 refills | Status: DC

## 2016-12-11 MED ORDER — LOSARTAN POTASSIUM 100 MG PO TABS: 100.0000 mg | ORAL_TABLET | Freq: Every day | ORAL | 6 refills | Status: DC

## 2016-12-11 MED ORDER — GLIMEPIRIDE 2 MG PO TABS: 2.0000 mg | ORAL_TABLET | Freq: Every day | ORAL | 3 refills | Status: DC

## 2016-12-11 MED ORDER — ATORVASTATIN CALCIUM 80 MG PO TABS: 80.0000 mg | ORAL_TABLET | Freq: Every day | ORAL | 3 refills | Status: DC

## 2016-12-11 NOTE — Progress Notes (Signed)
Inpatient Diabetes Program Recommendations  AACE/ADA: New Consensus Statement on Inpatient Glycemic Control (2015)  Target Ranges:  Prepandial:   less than 140 mg/dL      Peak postprandial:   less than 180 mg/dL (1-2 hours)      Critically ill patients:  140 - 180 mg/dL   Lab Results  Component Value Date   GLUCAP 273 (H) 12/11/2016   HGBA1C 12.3 (H) 12/10/2016    Review of Glycemic Control Results for Sally Hendrix, Sally Hendrix (MRN [id number]) as of 12/11/2016 11:31  Ref. Range 12/10/2016 06:11 12/10/2016 11:41 12/10/2016 17:47 12/10/2016 21:51 12/11/2016 06:35  Glucose-Capillary Latest Ref Range: 65 - 99 mg/dL 300 (H) 312 (H) 153 (H) 359 (H) 273 (H)   Diabetes history: DM2 Outpatient Diabetes medications: Amaryl 2 mg qd + Metformin 500 mg bid (noted not taken for 10 days preadmission) Current orders for Inpatient glycemic control: Novolog correction 0-9 tid  Inpatient Diabetes Program Recommendations:  Spoke with patient @ bedside @ length. Patient states she stopped taking her Metformin due to diarrhea approx. 3 days per week but willing to attempt to take Metformin and Amaryl on discharge.  Discussed insulin with patient. Patient requests to followup with her PCP prior to starting on insulin.  Spoke with pt about A1C 12.3 results with them and explained what an A1C is, basic pathophysiology of DM Type 2, basic home care, basic diabetes diet nutrition principles, importance of checking CBGs and maintaining good CBG control to prevent long-term and short-term complications. Reviewed signs and symptoms of hyperglycemia and hypoglycemia and how to treat hypoglycemia at home. Also reviewed blood sugar goals at home.  RNs to provide ongoing basic DM education at bedside with this patient.  Patient states she has been drinking regular Coke daily and reviewed with patient amount of sugar in soda and other drinks. Patient states willingness to change to diet drink and eliminate sugar.  Discussed with Dr. Tana Coast. Patient  to get meter and strips from [employer] [facility] and gave information to pt.  Thank you, Nani Gasser. Karuna Balducci, RN, MSN, CDE Inpatient Glycemic Control Team Team Pager [phone] (8am-5pm) 12/11/2016 11:42 AM

## 2016-12-11 NOTE — Progress Notes (Signed)
Progress Note  Patient Name: Sally Hendrix Date of Encounter: 12/11/2016  Primary Cardiologist: BD/Paxtyn Boyar  Subjective   Feeling well this morning. Did have some dyspnea with first brilinta dose, but improved this morning.   Inpatient Medications    Scheduled Meds: . aspirin EC  81 mg Oral Daily  . atorvastatin  80 mg Oral q1800  . carvedilol  25 mg Oral BID WC  . cefTRIAXone (ROCEPHIN)  IV  1 g Intravenous Q0600  . gabapentin  300 mg Oral QHS  . insulin aspart  0-9 Units Subcutaneous TID AC & HS  . losartan  100 mg Oral Daily  . sodium chloride flush  3 mL Intravenous Q12H  . ticagrelor  90 mg Oral BID   Continuous Infusions:  PRN Meds: sodium chloride, acetaminophen, hydrALAZINE, nitroGLYCERIN, ondansetron (ZOFRAN) IV, sodium chloride flush   Vital Signs    Vitals:   12/10/16 1745 12/10/16 2000 12/10/16 2100 12/11/16 0634  BP: (!) 175/99 (!) 187/97 122/68 (!) 174/114  Pulse: 94 100  90  Resp: 18 (!) 27 (!) 22 17  Temp: 98.3 F (36.8 C) 98.4 F (36.9 C)  98.3 F (36.8 C)  TempSrc: Oral Oral  Oral  SpO2: 99% 100%  98%  Weight:    151 lb 10.8 oz (68.8 kg)  Height:        Intake/Output Summary (Last 24 hours) at 12/11/16 0758 Last data filed at 12/11/16 0630  Gross per 24 hour  Intake           712.96 ml  Output              400 ml  Net           312.96 ml   Filed Weights   12/09/16 2228 12/10/16 0411 12/11/16 0634  Weight: 147 lb 11.2 oz (67 kg) 149 lb 9.6 oz (67.9 kg) 151 lb 10.8 oz (68.8 kg)    Telemetry    SR- Personally Reviewed  ECG    SR - Personally Reviewed  Physical Exam   General: Well developed, well nourished, female appearing in no acute distress. Head: Normocephalic, atraumatic.  Neck: Supple without bruits, JVD. Lungs:  Resp regular and unlabored, CTA. Heart: RRR, S1, S2, no S3, S4, or murmur; no rub. Abdomen: Soft, non-tender, non-distended with normoactive bowel sounds. No hepatomegaly. No rebound/guarding. No obvious abdominal  masses. Extremities: No clubbing, cyanosis, edema. Distal pedal pulses are 2+ bilaterally. Right radial site without bruising or hematoma Neuro: Alert and oriented X 3. Moves all extremities spontaneously. Psych: Normal affect.  Labs    Chemistry Recent Labs Lab 12/09/16 1720 12/09/16 2201 12/11/16 0348  NA 134*  --  137  K 4.1  --  3.5  CL 97*  --  102  CO2 28  --  24  GLUCOSE 365*  --  240*  BUN 15  --  18  CREATININE 0.84 0.88 1.03*  CALCIUM 9.2  --  9.1  PROT 6.7  --   --   ALBUMIN 3.6  --   --   AST 27  --   --   ALT 14  --   --   ALKPHOS 119  --   --   BILITOT 0.9  --   --   GFRNONAA >60 >60 57*  GFRAA >60 >60 >60  ANIONGAP 9  --  11     Hematology Recent Labs Lab 12/09/16 2023 12/09/16 2201 12/11/16 0348  WBC 11.0* 10.4 9.2  RBC 4.95 5.00 4.69  HGB 14.4 14.7 13.4  HCT 42.0 42.4 40.1  MCV 84.8 84.8 85.5  MCH 29.1 29.4 28.6  MCHC 34.3 34.7 33.4  RDW 13.3 13.3 13.7  PLT 264 254 243    Cardiac Enzymes Recent Labs Lab 12/09/16 2023 12/09/16 2201 12/10/16 0402 12/10/16 0943  TROPONINI 0.09* 0.14* 1.19* 1.94*   No results for input(s): TROPIPOC in the last 168 hours.   BNP Recent Labs Lab 12/09/16 1841  BNP 20.0     DDimer No results for input(s): DDIMER in the last 168 hours.    Radiology    Dg Chest 2 View  Result Date: 12/09/2016 CLINICAL DATA:  Left-sided chest pain radiating down the left arm, some shortness of breath EXAM: CHEST  2 VIEW COMPARISON:  Chest x-ray of 09/23/2010 FINDINGS: No definite active infiltrate or effusion is seen. Mediastinal and hilar contours are unremarkable. Mild cardiomegaly is stable. The only questionable abnormality is deep within the lung posterior sulcus on the lateral view. This area of opacity probably is due to overlapping bony structures, but a subtle lung lesion would be very difficult to exclude. Consider either followup chest x-ray or CT of the chest to assess further. IMPRESSION: 1. No definite active  process. 2. Opacity deep in the posterior lung sulcus only on the lateral view probably due to overlapping bony structures but recommend attention to this area on followup chest x-ray. Electronically Signed   By: Ivar Drape M.D.   On: 12/09/2016 16:58    Cardiac Studies   Cath: 12/10/16     There is moderate left ventricular systolic dysfunction.  LV end diastolic pressure is normal.  The left ventricular ejection fraction is 35-45% by visual estimate.  Ost RPDA lesion, 20 %stenosed.  Ost 1st Diag to 1st Diag lesion, 80 %stenosed.  Mid LAD lesion, 30 %stenosed.  A STENT ELUNIR 3.0 X 17 drug eluting stent was successfully placed.  Prox LAD lesion, 80 %stenosed.  Post intervention, there is a 0% residual stenosis.   1. Severe one-vessel coronary artery disease with thrombotic plaque rupture in the proximal LAD at the bifurcation of a small diagonal branch which had severe ostial disease. The second diagonal has moderate diffuse disease. 2. Moderately reduced LV systolic function with an EF of 40% with global hypokinesis. Normal LVEDP. 3. Successful angioplasty and drug-eluting stent placement to the proximal LAD. The first diagonal was jailed by the stent with loss of flow. However, the patient had no chest pain or EKG changes. Given that the vessel was diseased to start with and relatively small in diameter, I elected not to rescue this.  Recommendations: Dual antiplatelet therapy for at least one year. Aggressive treatment of risk factors. Resume heart failure medications.    Patient Profile     61 y.o. female with PMH of NICM, NIDDM, obesity, HTN who presented with chest pain.   Assessment & Plan    1. NSTEMI: Underwent LHC with angioplasty/DES placed to the pLAD, jailing in the 1st diag (80%), and second diag with moderate diffuse disease. Will need DAPT with ASA and Brilinta for at least one year.  -- continue coreg, losartan, statin -- will arrange follow up in the  office  2. Acute on Chronic systolic HF: Improved from 15% to 60% on last echo in 2012. Down again to 35-45% on cath yesterday. This was in the setting on noncompliance with home medication regimen.  -- echo pending.  -- discussed the importance of compliance with  home medications and DAPT.   3. HTN: Better controlled after resuming home medications  4. NIDDM: Uncontrolled this admission. Hgb A1c 12.3. Primary following  5. HL: LDL 201, on high dose statin. Needs FLP and LFTs in 6 weeks.   Signed, Reino Bellis, NP  12/11/2016, 7:58 AM     Attending Note:   The patient was seen and examined.  Agree with assessment and plan as noted above.  Changes made to the above note as needed.  Patient seen and independently examined with Reino Bellis, NP .   We discussed all aspects of the encounter. I agree with the assessment and plan as stated above.  1.  CAD :   Was found to have a prox LAD stenosis yesterday .  S/p stenting Doing well.   Slight dyspnea - likely from the [place] her to drink a caffeinated drink  2. Acute on chronic systolic CHF  Going for echo Due to noncompliance     I have spent a total of 40 minutes with patient reviewing hospital  notes , telemetry, EKGs, labs and examining patient as well as establishing an assessment and plan that was discussed with the patient. > 50% of time was spent in direct patient care.    Thayer Headings, Brooke Bonito., MD, [facility] 12/11/2016, 9:08 AM [address],  [place] Pager [phone]

## 2016-12-11 NOTE — Progress Notes (Signed)
  Echocardiogram 2D Echocardiogram has been performed.  Tresa Res 12/11/2016, 10:03 AM

## 2016-12-11 NOTE — Discharge Summary (Signed)
Physician Discharge Summary   Patient ID: Sally Hendrix MRN: [id number] DOB/AGE: 12-26-1955 61 y.o.  Admit date: 12/09/2016 Discharge date: 12/11/2016  Primary Care Physician:  Sally Lopes, MD  Discharge Diagnoses:    . NSTEMI  . Uncontrolled type 2 diabetes mellitus with hyperglycemia ([place]) . Hypertensive urgency . Acute on Chronic systolic heart failure (HCC)   Hyperlipidemia     Escherichia coli UTI       Consults:   cardiology   Recommendations for Outpatient Follow-up:  1. Needs fasting lipid panel and LFTs in 6 weeks  2. Please repeat CBC/BMET at next visit 3. Please repeat hemoglobin A1c in one month, follow log of the blood sugar readings and adjust patient's regimen. Patient may need to be on insulin regimen if A1c is not improving    DIET:Carb modified diet     Allergies:  No Known Allergies   DISCHARGE MEDICATIONS: Current Discharge Medication List    START taking these medications   Details  atorvastatin (LIPITOR) 80 MG tablet Take 1 tablet (80 mg total) by mouth at bedtime. Qty: 30 tablet, Refills: 3    cephALEXin (KEFLEX) 500 MG capsule Take 1 capsule (500 mg total) by mouth 2 (two) times daily. X 3 days Qty: 6 capsule, Refills: 0    nitroGLYCERIN (NITROSTAT) 0.4 MG SL tablet Place 1 tablet (0.4 mg total) under the tongue every 5 (five) minutes as needed for chest pain. Qty: 30 tablet, Refills: 12    spironolactone (ALDACTONE) 25 MG tablet Take 1 tablet (25 mg total) by mouth daily. Qty: 30 tablet, Refills: 4    ticagrelor (BRILINTA) 90 MG TABS tablet Take 1 tablet (90 mg total) by mouth 2 (two) times daily. Qty: 60 tablet, Refills: 0      CONTINUE these medications which have CHANGED   Details  aspirin 81 MG tablet Take 1 tablet (81 mg total) by mouth daily. Qty: 30 tablet, Refills: 3    carvedilol (COREG) 25 MG tablet Take 1 tablet (25 mg total) by mouth 2 (two) times daily with a meal. Qty: 60 tablet, Refills: 6    digoxin (LANOXIN)  0.125 MG tablet Take 1 tablet (125 mcg total) by mouth daily. Qty: 30 tablet, Refills: 6    furosemide (LASIX) 20 MG tablet Take 1 tablet (20 mg total) by mouth daily. Qty: 30 tablet, Refills: 6    glimepiride (AMARYL) 2 MG tablet Take 1 tablet (2 mg total) by mouth daily with breakfast. Qty: 30 tablet, Refills: 3    losartan (COZAAR) 100 MG tablet Take 1 tablet (100 mg total) by mouth daily. Qty: 30 tablet, Refills: 6    metFORMIN (GLUCOPHAGE) 1000 MG tablet Take 1 tablet (1,000 mg total) by mouth 2 (two) times daily with a meal. Qty: 60 tablet, Refills: 3      CONTINUE these medications which have NOT CHANGED   Details  gabapentin (NEURONTIN) 300 MG capsule Take 300 mg by mouth at bedtime.      STOP taking these medications     aspirin EC 81 MG tablet      ibuprofen (ADVIL,MOTRIN) 200 MG tablet          Brief H and P: For complete details please refer to admission H and P, but in brief Sally Hendrix a 61 y.o.femalewith history of nonischemic cardiomyopathy, hypertension and diabetes mellitus presents to the ER because of chest pain. Patient started experience chest pain yesterday afternoon which was retrosternal and lasted for a few minutes and  resolved after patient took some aspirin. Then patient took a small nap and woke up and started experiencing chest pain again and at this time the pain was radiating to his left arm. Patient took aspirin again. Patient was brought to the ER by patient's daughter.  Hospital Course:  NSTEMI:  - Patient was admitted with chest pain. Serial troponins positive. Cardiology was consulted. - Patient underwent cardiac cath which showed moderate left ventricle systolic dysfunction, EF 50% with global hypokinesis, thrombotic plaque rupture in the proximal LAD, severe ostial disease. Successful angioplasty and drug-eluting stent placement to the proximal LAD. - Per cardiology will need DAPT with ASA and Brilinta for at least one year.  -  continue coreg, losartan, statin - Follow up with cardiology arranged with Dr. Acie Hendrix   Acute on Chronic systolic HF:  - EF Improved from 15% to 60% on last echo in 2012. However on cardiac cath EF of 40% with global hypokinesis. This is in the setting on noncompliance with home medication regimen.  - 2-D echo 3/8 showed EF of 50-55% with no regional wall motion abnormalities Patient restarted on Lasix, spironolactone, digoxin as recommended by cardiology  HTN: Better controlled after resuming home medications  NIDDM: Uncontrolled this admission. Hgb A1c 12.3.  Diabetic coordinator consult was obtained. Metformin was increased to 1000 mg twice a day, continue Amaryl 2 mg daily. Patient also reported that she has been drinking regular coffee daily. Patient was recommended to change to her diet to drink water and eliminate sugar. Patient was recommended to get glucometer and check her blood sugars frequently. She was recommended to follow-up with her PCP closely. If no improvement in her A1c next 4-6 weeks, she may need to be on insulin.  Hyperlipidemia   LDL 201, on high dose statin. Needs FLP and LFTs in 6 weeks.   Escherichia coli UTI  Urine culture showed Escherichia coli, patient discharged on Keflex for another 3 days, she received IV Rocephin during the hospitalization   Day of Discharge BP (!) 173/99   Pulse (!) 101   Temp 98 F (36.7 C) (Oral)   Resp 13   Ht 5\' 3"  (1.6 m)   Wt 68.8 kg (151 lb 10.8 oz)   SpO2 98%   BMI 26.87 kg/m   Physical Exam: General: Alert and awake oriented x3 not in any acute distress. HEENT: anicteric sclera, pupils reactive to light and accommodation CVS: S1-S2 clear no murmur rubs or gallops Chest: clear to auscultation bilaterally, no wheezing rales or rhonchi Abdomen: soft nontender, nondistended, normal bowel sounds Extremities: no cyanosis, clubbing or edema noted bilaterally Neuro: Cranial nerves II-XII intact, no focal neurological  deficits   The results of significant diagnostics from this hospitalization (including imaging, microbiology, ancillary and laboratory) are listed below for reference.    LAB RESULTS: Basic Metabolic Panel:  Recent Labs Lab 12/09/16 1720 12/09/16 2201 12/11/16 0348  NA 134*  --  137  K 4.1  --  3.5  CL 97*  --  102  CO2 28  --  24  GLUCOSE 365*  --  240*  BUN 15  --  18  CREATININE 0.84 0.88 1.03*  CALCIUM 9.2  --  9.1   Liver Function Tests:  Recent Labs Lab 12/09/16 1720  AST 27  ALT 14  ALKPHOS 119  BILITOT 0.9  PROT 6.7  ALBUMIN 3.6   No results for input(s): LIPASE, AMYLASE in the last 168 hours. No results for input(s): AMMONIA in the last  168 hours. CBC:  Recent Labs Lab 12/09/16 2023 12/09/16 2201 12/11/16 0348  WBC 11.0* 10.4 9.2  NEUTROABS 7.1  --   --   HGB 14.4 14.7 13.4  HCT 42.0 42.4 40.1  MCV 84.8 84.8 85.5  PLT 264 254 243   Cardiac Enzymes:  Recent Labs Lab 12/10/16 0402 12/10/16 0943  TROPONINI 1.19* 1.94*   BNP: Invalid input(s): POCBNP CBG:  Recent Labs Lab 12/10/16 2151 12/11/16 0635  GLUCAP 359* 273*    Significant Diagnostic Studies:  Dg Chest 2 View  Result Date: 12/09/2016 CLINICAL DATA:  Left-sided chest pain radiating down the left arm, some shortness of breath EXAM: CHEST  2 VIEW COMPARISON:  Chest x-ray of 09/23/2010 FINDINGS: No definite active infiltrate or effusion is seen. Mediastinal and hilar contours are unremarkable. Mild cardiomegaly is stable. The only questionable abnormality is deep within the lung posterior sulcus on the lateral view. This area of opacity probably is due to overlapping bony structures, but a subtle lung lesion would be very difficult to exclude. Consider either followup chest x-ray or CT of the chest to assess further. IMPRESSION: 1. No definite active process. 2. Opacity deep in the posterior lung sulcus only on the lateral view probably due to overlapping bony structures but recommend  attention to this area on followup chest x-ray. Electronically Signed   By: Ivar Drape M.D.   On: 12/09/2016 16:58    2D ECHO: Study Conclusions  - Left ventricle: The cavity size was normal. Wall thickness was   increased in a pattern of mild LVH. Systolic function was normal.   The estimated ejection fraction was in the range of 50% to 55%.   Wall motion was normal; there were no regional wall motion   abnormalities. - Left atrium: The atrium was mildly dilated.  Impressions:  - Low normal to mildly reduced LV systolic function; mild LVH;   mild LAE.   Disposition and Follow-up: Discharge Instructions    AMB Referral to Cardiac Rehabilitation - Phase II    Complete by:  As directed    Diagnosis:   NSTEMI Coronary Stents     Amb Referral to Cardiac Rehabilitation    Complete by:  As directed    Diagnosis:   Coronary Stents NSTEMI PTCA     Discharge instructions    Complete by:  As directed    Radial Site Care Refer to this sheet in the next few weeks. These instructions provide you with information on caring for yourself after your procedure. Your caregiver may also give you more specific instructions. Your treatment has been planned according to current medical practices, but problems sometimes occur. Call your caregiver if you have any problems or questions after your procedure. HOME CARE INSTRUCTIONS You may shower the day after the procedure.Remove the bandage (dressing) and gently wash the site with plain soap and water.Gently pat the site dry.  Do not apply powder or lotion to the site.  Do not submerge the affected site in water for 3 to 5 days.  Inspect the site at least twice daily.  Do not flex or bend the affected arm for 24 hours.  No lifting over 5 pounds (2.3 kg) for 5 days after your procedure.  Do not drive home if you are discharged the same day of the procedure. Have someone else drive you.  You may drive 24 hours after the procedure unless  otherwise instructed by your caregiver.  What to expect: Any bruising will usually fade  within 1 to 2 weeks.  Blood that collects in the tissue (hematoma) may be painful to the touch. It should usually decrease in size and tenderness within 1 to 2 weeks.  SEEK IMMEDIATE MEDICAL CARE IF: You have unusual pain at the radial site.  You have redness, warmth, swelling, or pain at the radial site.  You have drainage (other than a small amount of blood on the dressing).  You have chills.  You have a fever or persistent symptoms for more than 72 hours.  You have a fever and your symptoms suddenly get worse.  Your arm becomes pale, cool, tingly, or numb.  You have heavy bleeding from the site. Hold pressure on the site.       DISPOSITION:Home  DISCHARGE FOLLOW-UP Follow-up Information    Mertie Moores, MD Follow up on 12/16/2016.   Specialty:  Cardiology Why:  9:45AM  Contact information: [place] [address] [phone]        Sally Lopes, MD. Schedule an appointment as soon as possible for a visit in 2 week(s).   Specialty:  Internal Medicine Contact information: [address] [place] [address] [phone]            Time spent on Discharge: 35 minute  signed:   Precious Segall M.D. [facility] 12/11/2016, 12:39 PM Pager: [phone]

## 2016-12-11 NOTE — Progress Notes (Signed)
CARDIAC REHAB PHASE I   PRE:  Rate/Rhythm: 94 SR    BP: sitting 143/86    SaO2:   MODE:  Ambulation: 900 ft   POST:  Rate/Rhythm: 103 ST    BP: sitting 184/110, other arm recheck 181/104, recheck 30 min later 173/99     SaO2: 98 RA  Pt just back from echo and very active/excited in room with family.  Able to walk however had x2 episodes of sudden SOB, presumed due to Brilinta. She seems moderately anxious/hyper. BP elevated after walk, did not decrease during education (see above). Discussed MI, stent, Brilinta, HF, daily wts, low sodium, DM cand carb counting, ex, NTG, and CRPII. Will send referral to [facility]. She has been quite stressed and I encouraged her to find balance and also see her PCP.  [phone]  [place], ACSM 12/11/2016 11:09 AM

## 2016-12-11 NOTE — Care Management Note (Signed)
Case Management Note Original Note initiated by Jonnie Finner 3/1/718  Patient Details  Name: Sally Hendrix MRN: [id number] Date of Birth: July 13, 1956  Subjective/Objective:      Chest pain, uncontrolled DM, CHF,  HTN              Action/Plan: Discharge Planning: NCM spoke to pt and states she goes to [facility] on [address]. They do have Brilinta in stock. Provided pt with Brilinta 30 day trial card. Placed Brilinta patient assistance application on shadow chart for attending to complete. Explained to pt she will need to mail completed application with info on her income or copy of filed tax paperwork. States she does not currently work. Lives in home with husband. Pt pays out of pocket for PCP appts, and uses meds on [facility] $4.00 list.   PCP Leanna Battles    Expected Discharge Date:  12/11/16               Expected Discharge Plan:  Home/Self Care  In-House Referral:  NA  Discharge planning Services  CM Consult, Medication Assistance  Post Acute Care Choice:  NA Choice offered to:  NA  DME Arranged:  N/A DME Agency:  NA  HH Arranged:  NA HH Agency:  NA  Status of Service:  In process, will continue to follow  If discussed at Long Length of Stay Meetings, dates discussed:    Additional Comments: 12/11/2016 Pt has discharge orders.  Pt is alert and oriented with daughter at bedside , PTA independent and ambulating in room indpendently.  Pt provided/explained MATCH and explained that Brilinita should be covered by the free copay card- pt still has card.  CM also encouraged pt to contact [facility] or  Monday and request discharge follow up appt ASAP - CM unable to make appt.  Both daughter and pt were extremely appreciative of assistance provided.   Sally Labrador, RN 12/11/2016, 12:18 PM

## 2016-12-12 LAB — URINE CULTURE

## 2016-12-16 ENCOUNTER — Ambulatory Visit: Payer: Self-pay | Admitting: Cardiovascular Disease

## 2016-12-17 ENCOUNTER — Encounter: Payer: Self-pay | Admitting: Cardiovascular Disease

## 2016-12-18 ENCOUNTER — Ambulatory Visit (INDEPENDENT_AMBULATORY_CARE_PROVIDER_SITE_OTHER): Payer: Self-pay | Admitting: Cardiovascular Disease

## 2016-12-18 ENCOUNTER — Encounter: Payer: Self-pay | Admitting: Nurse Practitioner

## 2016-12-18 ENCOUNTER — Encounter (INDEPENDENT_AMBULATORY_CARE_PROVIDER_SITE_OTHER): Payer: Self-pay

## 2016-12-18 ENCOUNTER — Encounter: Payer: Self-pay | Admitting: Cardiovascular Disease

## 2016-12-18 VITALS — BP 158/94 | HR 92 | Ht 63.0 in | Wt 150.1 lb

## 2016-12-18 DIAGNOSIS — I2510 Atherosclerotic heart disease of native coronary artery without angina pectoris: Secondary | ICD-10-CM

## 2016-12-18 DIAGNOSIS — I214 Non-ST elevation (NSTEMI) myocardial infarction: Secondary | ICD-10-CM

## 2016-12-18 DIAGNOSIS — I5022 Chronic systolic (congestive) heart failure: Secondary | ICD-10-CM

## 2016-12-18 DIAGNOSIS — I10 Essential (primary) hypertension: Secondary | ICD-10-CM

## 2016-12-18 MED ORDER — CARVEDILOL 25 MG PO TABS: 37.5000 mg | ORAL_TABLET | Freq: Two times a day (BID) | ORAL | 3 refills | Status: DC

## 2016-12-18 NOTE — Patient Instructions (Signed)
Medication Instructions:  INCREASE Carvedilol (Coreg) to 37.5 mg twice daily   Labwork: TODAY - TSH, BMET   Testing/Procedures: None Ordered   Follow-Up: Your physician recommends that you schedule a follow-up appointment in: 3 months with Dr. Acie Fredrickson   If you need a refill on your cardiac medications before your next appointment, please call your pharmacy.   Thank you for choosing [facility]! Christen Bame, RN [phone]

## 2016-12-18 NOTE — Progress Notes (Signed)
Cardiology Office Note   Date:  12/18/2016   ID:  Sally Hendrix, DOB October 30, 1955, MRN [id number]  PCP:  Donnajean Lopes, MD  Cardiologist:   Mertie Moores, MD    Problem List 1.  Acute on Chronic systolic CHF 2. CAD -  3. Essential HTN 4.  OSA 5. Diabetes Mellitus   Chief Complaint  Patient presents with  . Follow-up    CAD, CHF      History of Present Illness: Sally Hendrix is a 61 y.o. female who presents for Follow-up for recent hospitalization for HTN,  coronary artery disease and congestive heart failure.  Sally Hendrix was seen in the heart failure clinic for several years. She ran out of her medications and has not been seen there for several years. She presented to the hospital in March with increasing shortness of breath and some chest discomfort. A heart catheterization revealed a tight 80% stenosis in the proximal left anterior descending artery. She is status post successful stenting of the proximal left. Ascending artery. She was restarted on her heart failure medications.  Her left ventricular function by left ventricular gram revealed EF of 40%.  Echocardiogram was performed the day following her PCI and revealed normal left ventricle systolic function with an ejection fraction of 50-55%.  No further episodes of CP  BP has been in the normal to slightly higher range. She had an episode of low blood pressure yesterday but this is been on isolated event.  She is avoiding salt   Past Medical History:  Diagnosis Date  . CHF (congestive heart failure) ([place])   . Diabetes mellitus   . Hypertension   . Nonischemic cardiomyopathy ([place])   . Obesity   . Sleep apnea     Past Surgical History:  Procedure Laterality Date  . CARDIAC CATHETERIZATION    . CORONARY STENT INTERVENTION N/A 12/10/2016   Procedure: Coronary Stent Intervention;  Surgeon: Wellington Hampshire, MD;  Location: [facility];  Service: Cardiovascular;  Laterality: N/A;  . LEFT HEART CATH AND  CORONARY ANGIOGRAPHY N/A 12/10/2016   Procedure: Left Heart Cath and Coronary Angiography;  Surgeon: Wellington Hampshire, MD;  Location: [facility];  Service: Cardiovascular;  Laterality: N/A;     Current Outpatient Prescriptions  Medication Sig Dispense Refill  . aspirin 81 MG tablet Take 1 tablet (81 mg total) by mouth daily. 30 tablet 3  . atorvastatin (LIPITOR) 80 MG tablet Take 1 tablet (80 mg total) by mouth at bedtime. 30 tablet 3  . carvedilol (COREG) 25 MG tablet Take 1 tablet (25 mg total) by mouth 2 (two) times daily with a meal. 60 tablet 6  . digoxin (LANOXIN) 0.125 MG tablet Take 1 tablet (125 mcg total) by mouth daily. 30 tablet 6  . furosemide (LASIX) 20 MG tablet Take 1 tablet (20 mg total) by mouth daily. 30 tablet 6  . gabapentin (NEURONTIN) 300 MG capsule Take 300 mg by mouth at bedtime.    Marland Kitchen glimepiride (AMARYL) 2 MG tablet Take 1 tablet (2 mg total) by mouth daily with breakfast. 30 tablet 3  . losartan (COZAAR) 100 MG tablet Take 1 tablet (100 mg total) by mouth daily. 30 tablet 6  . metFORMIN (GLUCOPHAGE) 1000 MG tablet Take 1 tablet (1,000 mg total) by mouth 2 (two) times daily with a meal. 60 tablet 3  . nitroGLYCERIN (NITROSTAT) 0.4 MG SL tablet Place 1 tablet (0.4 mg total) under the tongue every 5 (five) minutes as  needed for chest pain. 30 tablet 12  . spironolactone (ALDACTONE) 25 MG tablet Take 1 tablet (25 mg total) by mouth daily. 30 tablet 4  . ticagrelor (BRILINTA) 90 MG TABS tablet Take 1 tablet (90 mg total) by mouth 2 (two) times daily. 60 tablet 0   No current facility-administered medications for this visit.     No flowsheet data found.    Allergies:   Patient has no known allergies.    Social History:  The patient  reports that she has quit smoking. She has never used smokeless tobacco. She reports that she drinks alcohol. She reports that she does not use drugs.   Family History:  The patient's family history includes Coronary artery  disease (age of onset: 17) in her father; Heart failure in her mother; Other in her brother.    ROS:  Please see the history of present illness.    Review of Systems: Constitutional:  denies fever, chills, diaphoresis, appetite change and fatigue.  HEENT: denies photophobia, eye pain, redness, hearing loss, ear pain, congestion, sore throat, rhinorrhea, sneezing, neck pain, neck stiffness and tinnitus.  Respiratory: denies SOB, DOE, cough, chest tightness, and wheezing.  Cardiovascular: denies chest pain, palpitations and leg swelling.  Gastrointestinal: denies nausea, vomiting, abdominal pain, diarrhea, constipation, blood in stool.  Genitourinary: denies dysuria, urgency, frequency, hematuria, flank pain and difficulty urinating.  Musculoskeletal: denies  myalgias, back pain, joint swelling, arthralgias and gait problem.   Skin: denies pallor, rash and wound.  Neurological: denies dizziness, seizures, syncope, weakness, light-headedness, numbness and headaches.   Hematological: denies adenopathy, easy bruising, personal or family bleeding history.  Psychiatric/ Behavioral: denies suicidal ideation, mood changes, confusion, nervousness, sleep disturbance and agitation.       All other systems are reviewed and negative.    PHYSICAL EXAM: VS:  BP (!) 158/94 (BP Location: Left Arm, Patient Position: Sitting, Cuff Size: Normal)   Pulse 92   Ht 5\' 3"  (1.6 m)   Wt 150 lb 1.9 oz (68.1 kg)   SpO2 98%   BMI 26.59 kg/m  , BMI Body mass index is 26.59 kg/m. GEN: Well nourished, well developed, in no acute distress  HEENT: normal  Neck: no JVD, carotid bruits, or masses Cardiac: RRR; no murmurs, rubs, or gallops,no edema  Respiratory:  clear to auscultation bilaterally, normal work of breathing GI: soft, nontender, nondistended, + BS MS: no deformity or atrophy  Skin: warm and dry, no rash Neuro:  Strength and sensation are intact Psych: normal   EKG:  EKG is not ordered  today.    Recent Labs: 12/09/2016: ALT 14; B Natriuretic Peptide 20.0 12/11/2016: BUN 18; Creatinine, Ser 1.03; Hemoglobin 13.4; Platelets 243; Potassium 3.5; Sodium 137    Lipid Panel    Component Value Date/Time   CHOL 271 (H) 12/10/2016 1824   TRIG 153 (H) 12/10/2016 1824   HDL 39 (L) 12/10/2016 1824   CHOLHDL 6.9 12/10/2016 1824   VLDL 31 12/10/2016 1824   LDLCALC 201 (H) 12/10/2016 1824      Wt Readings from Last 3 Encounters:  12/18/16 150 lb 1.9 oz (68.1 kg)  12/11/16 151 lb 10.8 oz (68.8 kg)  05/05/11 162 lb (73.5 kg)      Other studies Reviewed: Additional studies/ records that were reviewed today include: . Review of the above records demonstrates:   ASSESSMENT AND PLAN:  1. Coronary artery disease: Sally Hendrix is doing very well. She is no longer having any further episodes of chest pain. We'll  continue with aspirin and Brilinta. We will continue with atorvastatin. She'll need aggressive treatment of her lipids  2. Chronic systolic congestive heart failure: She had an echo card gram recently that showed that her left ventricular systolic function was at the lower limits of normal. I would like to increase the carvedilol to 37.5 mg twice a day. She will continue with the current dose of Lasix and losartan.  3. Essential hypertension: We'll increase the carvedilol as noted above. Continued to watch her salt. We will continue with other medications. I will see her again in 3 months.   Current medicines are reviewed at length with the patient today.  The patient does not have concerns regarding medicines.  Labs/ tests ordered today include:  No orders of the defined types were placed in this encounter.    Disposition:   FU with me in 3 months      Mertie Moores, MD  12/18/2016 2:20 PM    [facility] [place], [place], [place]  [address] Phone: [phone]; Fax: [phone]

## 2016-12-31 ENCOUNTER — Ambulatory Visit: Payer: Self-pay | Admitting: Physician Assistant

## 2017-02-20 ENCOUNTER — Telehealth: Payer: Self-pay | Admitting: Cardiovascular Disease

## 2017-02-20 NOTE — Telephone Encounter (Signed)
Patient calling, states that she is currently taking Brilinta but it too expensive so she would like to know if there was a cheaper alternative? Thanks.

## 2017-02-20 NOTE — Telephone Encounter (Signed)
Returned call to patient-patient reports she had discount card for Brilinta and states medication was going to cost approx. $300 this month.  Wondering if there is something else she can take that is not as expensive.  Per chart review-patient was to mail in assistance forms after d/c, reports she has not mailed at this time.    Advised to mail forms in, samples provided to patient.    Pt verbalized understanding.

## 2017-03-04 ENCOUNTER — Encounter: Payer: Self-pay | Admitting: Cardiovascular Disease

## 2017-03-23 ENCOUNTER — Ambulatory Visit: Payer: Self-pay | Admitting: Cardiovascular Disease

## 2018-02-11 ENCOUNTER — Emergency Department (HOSPITAL_COMMUNITY)
Admission: EM | Admit: 2018-02-11 | Discharge: 2018-02-11 | Disposition: A | Discharge status: Home / Self Care | Payer: Self-pay | Attending: Emergency Medicine | Admitting: Emergency Medicine

## 2018-02-11 ENCOUNTER — Other Ambulatory Visit: Payer: Self-pay

## 2018-02-11 ENCOUNTER — Encounter (HOSPITAL_COMMUNITY): Payer: Self-pay | Admitting: Emergency Medicine

## 2018-02-11 ENCOUNTER — Emergency Department (HOSPITAL_COMMUNITY): Payer: Self-pay

## 2018-02-11 DIAGNOSIS — H538 Other visual disturbances: Secondary | ICD-10-CM | POA: Insufficient documentation

## 2018-02-11 DIAGNOSIS — D329 Benign neoplasm of meninges, unspecified: Secondary | ICD-10-CM | POA: Insufficient documentation

## 2018-02-11 DIAGNOSIS — E1165 Type 2 diabetes mellitus with hyperglycemia: Secondary | ICD-10-CM | POA: Insufficient documentation

## 2018-02-11 DIAGNOSIS — I110 Hypertensive heart disease with heart failure: Secondary | ICD-10-CM | POA: Insufficient documentation

## 2018-02-11 DIAGNOSIS — Z7982 Long term (current) use of aspirin: Secondary | ICD-10-CM | POA: Insufficient documentation

## 2018-02-11 DIAGNOSIS — Z87891 Personal history of nicotine dependence: Secondary | ICD-10-CM | POA: Insufficient documentation

## 2018-02-11 DIAGNOSIS — Z7984 Long term (current) use of oral hypoglycemic drugs: Secondary | ICD-10-CM | POA: Insufficient documentation

## 2018-02-11 DIAGNOSIS — Z79899 Other long term (current) drug therapy: Secondary | ICD-10-CM | POA: Insufficient documentation

## 2018-02-11 DIAGNOSIS — I5022 Chronic systolic (congestive) heart failure: Secondary | ICD-10-CM | POA: Insufficient documentation

## 2018-02-11 LAB — I-STAT CHEM 8, ED
BUN: 19 mg/dL (ref 6–20)
CHLORIDE: 97 mmol/L — AB (ref 101–111)
Calcium, Ion: 1.14 mmol/L — ABNORMAL LOW (ref 1.15–1.40)
Creatinine, Ser: 0.8 mg/dL (ref 0.44–1.00)
Glucose, Bld: 423 mg/dL — ABNORMAL HIGH (ref 65–99)
HEMATOCRIT: 47 % — AB (ref 36.0–46.0)
Hemoglobin: 16 g/dL — ABNORMAL HIGH (ref 12.0–15.0)
POTASSIUM: 4.3 mmol/L (ref 3.5–5.1)
SODIUM: 132 mmol/L — AB (ref 135–145)
TCO2: 26 mmol/L (ref 22–32)

## 2018-02-11 LAB — COMPREHENSIVE METABOLIC PANEL
ALBUMIN: 3.8 g/dL (ref 3.5–5.0)
ALT: 19 U/L (ref 14–54)
AST: 19 U/L (ref 15–41)
Alkaline Phosphatase: 120 U/L (ref 38–126)
Anion gap: 11 (ref 5–15)
BILIRUBIN TOTAL: 0.7 mg/dL (ref 0.3–1.2)
BUN: 17 mg/dL (ref 6–20)
CHLORIDE: 97 mmol/L — AB (ref 101–111)
CO2: 25 mmol/L (ref 22–32)
Calcium: 9.5 mg/dL (ref 8.9–10.3)
Creatinine, Ser: 0.89 mg/dL (ref 0.44–1.00)
GFR calc Af Amer: >60 mL/min (ref >60)
GFR calc non Af Amer: >60 mL/min (ref >60)
GLUCOSE: 420 mg/dL — AB (ref 65–99)
Potassium: 4.6 mmol/L (ref 3.5–5.1)
SODIUM: 133 mmol/L — AB (ref 135–145)
Total Protein: 7.2 g/dL (ref 6.5–8.1)

## 2018-02-11 LAB — DIFFERENTIAL
BASOS ABS: 0 10*3/uL (ref 0.0–0.1)
Basophils Relative: 0 %
Eosinophils Absolute: 0.2 10*3/uL (ref 0.0–0.7)
Eosinophils Relative: 2 %
LYMPHS ABS: 1.9 10*3/uL (ref 0.7–4.0)
Lymphocytes Relative: 18 %
Monocytes Absolute: 0.5 10*3/uL (ref 0.1–1.0)
Monocytes Relative: 5 %
NEUTROS PCT: 75 %
Neutro Abs: 8.2 10*3/uL — ABNORMAL HIGH (ref 1.7–7.7)

## 2018-02-11 LAB — DIGOXIN LEVEL: Digoxin Level: <0.2 ng/mL — ABNORMAL LOW (ref 0.8–2.0)

## 2018-02-11 LAB — CBC
HCT: 44.8 % (ref 36.0–46.0)
HEMOGLOBIN: 15.8 g/dL — AB (ref 12.0–15.0)
MCH: 30 pg (ref 26.0–34.0)
MCHC: 35.3 g/dL (ref 30.0–36.0)
MCV: 85 fL (ref 78.0–100.0)
Platelets: 288 10*3/uL (ref 150–400)
RBC: 5.27 MIL/uL — AB (ref 3.87–5.11)
RDW: 12.8 % (ref 11.5–15.5)
WBC: 10.9 10*3/uL — ABNORMAL HIGH (ref 4.0–10.5)

## 2018-02-11 LAB — I-STAT TROPONIN, ED: Troponin i, poc: 0.01 ng/mL (ref 0.00–0.08)

## 2018-02-11 LAB — BRAIN NATRIURETIC PEPTIDE: B NATRIURETIC PEPTIDE 5: 36 pg/mL (ref 0.0–100.0)

## 2018-02-11 LAB — CBG MONITORING, ED: Glucose-Capillary: 384 mg/dL — ABNORMAL HIGH (ref 65–99)

## 2018-02-11 LAB — PROTIME-INR
INR: 0.87
Prothrombin Time: 11.7 seconds (ref 11.4–15.2)

## 2018-02-11 LAB — APTT: APTT: 26 s (ref 24–36)

## 2018-02-11 MED ORDER — TICAGRELOR 90 MG PO TABS: 90.0000 mg | ORAL_TABLET | Freq: Two times a day (BID) | ORAL | 0 refills | Status: DC

## 2018-02-11 MED ORDER — GABAPENTIN 300 MG PO CAPS
300.0000 mg | ORAL_CAPSULE | Freq: Every day | ORAL | 0 refills | Status: DC
Start: 2018-02-11 — End: 2018-05-05

## 2018-02-11 MED ORDER — FUROSEMIDE 20 MG PO TABS: 20.0000 mg | ORAL_TABLET | Freq: Every day | ORAL | 0 refills | Status: DC

## 2018-02-11 MED ORDER — LOSARTAN POTASSIUM 100 MG PO TABS: 100.0000 mg | ORAL_TABLET | Freq: Every day | ORAL | 0 refills | Status: DC

## 2018-02-11 NOTE — ED Triage Notes (Signed)
Pt states she woke up at 0630 and felt normal. Pt then woke up at 0830 and had blurry vision, trouble speaking and left arm numbness. Pt states her vision, speech are back to normal, but left arm is still numb. Pt states she usually has some left arm numbness but it is worse today. Pt's speech is clear. Pt states her initial BP this morning was 643 systolic.Grips equal bilaterally, no drift. PA requested to evaluate pt.

## 2018-02-11 NOTE — ED Notes (Signed)
Patient transported to MRI 

## 2018-02-11 NOTE — Discharge Instructions (Addendum)
As discussed, your evaluation today has been largely reassuring.  But, it is important that you monitor your condition carefully, and do not hesitate to return to the ED if you develop new, or concerning changes in your condition.  Please be sure to take all medication, including digoxin as instructed.  Otherwise, please follow-up with your physician for appropriate ongoing care.

## 2018-02-11 NOTE — ED Provider Notes (Signed)
[facility] EMERGENCY DEPARTMENT Provider Note   CSN: [id number] Arrival date & time: 02/11/18  1103     History   Chief Complaint Chief Complaint  Patient presents with  . Blurred Vision    HPI Sally Hendrix is a 62 y.o. female.  HPI Patient presents after episode of blurred vision which has resolved. Patient denies history of stroke, TIA, does have a history of CHF, hypertension. She notes that she was in her usual state of health until awakening this morning, about 6 hours prior to my evaluation. Soon after awakening she noticed blurry vision in both eyes, without weakness in extremities, difficulty speaking, lightheadedness, or focal pain. The blurry vision persisted for several moments, and the patient checked her blood pressure during this episode.  When she found to be elevated, with a systolic greater than 607. She took all of her home medication, which includes multiple antihypertensives, and symptoms have resolved. She denies similar prior events.  Not she is here with her husband who assists with the HPI.  Past Medical History:  Diagnosis Date  . CHF (congestive heart failure) ([place])   . Diabetes mellitus   . Hypertension   . Nonischemic cardiomyopathy ([place])   . Obesity   . Sleep apnea     Patient Active Problem List   Diagnosis Date Noted  . Non-ST elevation (NSTEMI) myocardial infarction ([place])   . Chest pain 12/09/2016  . Uncontrolled type 2 diabetes mellitus with hyperglycemia ([place]) 12/09/2016  . Hypertensive urgency 12/09/2016  . Chronic systolic heart failure ([place]) 03/31/2011  . Essential hypertension 03/31/2011  . DM2 (diabetes mellitus, type 2) ([place]) 03/31/2011    Past Surgical History:  Procedure Laterality Date  . CARDIAC CATHETERIZATION    . CORONARY STENT INTERVENTION N/A 12/10/2016   Procedure: Coronary Stent Intervention;  Surgeon: Wellington Hampshire, MD;  Location: [facility];  Service: Cardiovascular;  Laterality: N/A;   . LEFT HEART CATH AND CORONARY ANGIOGRAPHY N/A 12/10/2016   Procedure: Left Heart Cath and Coronary Angiography;  Surgeon: Wellington Hampshire, MD;  Location: [facility];  Service: Cardiovascular;  Laterality: N/A;     OB History   None      Home Medications    Prior to Admission medications   Medication Sig Start Date End Date Taking? Authorizing Provider  aspirin 81 MG tablet Take 1 tablet (81 mg total) by mouth daily. 12/11/16  Yes Rai, Ripudeep K, MD  carvedilol (COREG) 25 MG tablet Take 1.5 tablets (37.5 mg total) by mouth 2 (two) times daily. Patient taking differently: Take 12.5-25 mg by mouth See admin instructions. Take 1 tablet (25 mg totally) in the morning; take 0.5 tablet (12.5 mg totally) in the evening 12/18/16 02/11/18 Yes Nahser, Wonda Cheng, MD  digoxin (LANOXIN) 0.125 MG tablet Take 1 tablet (125 mcg total) by mouth daily. 12/11/16  Yes Rai, Ripudeep K, MD  furosemide (LASIX) 20 MG tablet Take 1 tablet (20 mg total) by mouth daily. 12/11/16  Yes Rai, Ripudeep K, MD  gabapentin (NEURONTIN) 300 MG capsule Take 300 mg by mouth at bedtime.   Yes [provider]  glimepiride (AMARYL) 2 MG tablet Take 1 tablet (2 mg total) by mouth daily with breakfast. 12/11/16  Yes Rai, Ripudeep K, MD  losartan (COZAAR) 100 MG tablet Take 1 tablet (100 mg total) by mouth daily. 12/11/16 08/22/22 Yes Rai, Ripudeep K, MD  metFORMIN (GLUCOPHAGE) 1000 MG tablet Take 1 tablet (1,000 mg total) by mouth  2 (two) times daily with a meal. 12/11/16  Yes Rai, Ripudeep K, MD  nitroGLYCERIN (NITROSTAT) 0.4 MG SL tablet Place 1 tablet (0.4 mg total) under the tongue every 5 (five) minutes as needed for chest pain. 12/11/16  Yes Rai, Ripudeep K, MD  spironolactone (ALDACTONE) 25 MG tablet Take 1 tablet (25 mg total) by mouth daily. Patient taking differently: Take 25 mg by mouth daily as needed (when taking Lasix).  12/11/16  Yes Rai, Ripudeep K, MD  ticagrelor (BRILINTA) 90 MG TABS tablet Take 1 tablet (90 mg  total) by mouth 2 (two) times daily. 12/11/16  Yes Rai, Ripudeep K, MD  atorvastatin (LIPITOR) 80 MG tablet Take 1 tablet (80 mg total) by mouth at bedtime. Patient not taking: Reported on 02/11/2018 12/11/16   Mendel Corning, MD    Family History Family History  Problem Relation Age of Onset  . Heart failure Mother        hx of - died of nonalcoholic cirrhosis  . Coronary artery disease Father 30       alive  . Other Brother        brain tumor    Social History Social History   Tobacco Use  . Smoking status: Former Research scientist (life sciences)  . Smokeless tobacco: Never Used  Substance Use Topics  . Alcohol use: Yes    Comment: occasional  . Drug use: No     Allergies   Patient has no known allergies.   Review of Systems Review of Systems  Constitutional:       Per HPI, otherwise negative  HENT:       Per HPI, otherwise negative  Eyes: Positive for visual disturbance. Negative for photophobia, pain, discharge and redness.  Respiratory:       Per HPI, otherwise negative  Cardiovascular:       Per HPI, otherwise negative  Gastrointestinal: Negative for vomiting.  Endocrine:       Negative aside from HPI  Genitourinary:       Neg aside from HPI   Musculoskeletal:       Per HPI, otherwise negative  Skin: Negative.   Neurological: Negative for syncope.     Physical Exam Updated Vital Signs BP (!) 153/93   Pulse 91   Temp 98.8 F (37.1 C) (Oral)   Resp 16   Ht 5\' 3"  (1.6 m)   Wt 66.7 kg (147 lb)   SpO2 95%   BMI 26.04 kg/m   Physical Exam  Constitutional: She is oriented to person, place, and time. She appears well-developed and well-nourished. No distress.  HENT:  Head: Normocephalic and atraumatic.  Eyes: Conjunctivae and EOM are normal.  Cardiovascular: Normal rate and regular rhythm.  Pulmonary/Chest: Effort normal and breath sounds normal. No stridor. No respiratory distress.  Abdominal: She exhibits no distension.  Musculoskeletal: She exhibits no edema.    Neurological: She is alert and oriented to person, place, and time. She displays no atrophy and no tremor. No cranial nerve deficit or sensory deficit. She exhibits normal muscle tone. She displays no seizure activity. Coordination normal.  Skin: Skin is warm and dry.  Psychiatric: She has a normal mood and affect.  Nursing note and vitals reviewed.    ED Treatments / Results  Labs (all labs ordered are listed, but only abnormal results are displayed) Labs Reviewed  CBC - Abnormal; Notable for the following components:      Result Value   WBC 10.9 (*)    RBC 5.27 (*)  Hemoglobin 15.8 (*)    All other components within normal limits  DIFFERENTIAL - Abnormal; Notable for the following components:   Neutro Abs 8.2 (*)    All other components within normal limits  COMPREHENSIVE METABOLIC PANEL - Abnormal; Notable for the following components:   Sodium 133 (*)    Chloride 97 (*)    Glucose, Bld 420 (*)    All other components within normal limits  DIGOXIN LEVEL - Abnormal; Notable for the following components:   Digoxin Level <0.2 (*)    All other components within normal limits  CBG MONITORING, ED - Abnormal; Notable for the following components:   Glucose-Capillary 384 (*)    All other components within normal limits  I-STAT CHEM 8, ED - Abnormal; Notable for the following components:   Sodium 132 (*)    Chloride 97 (*)    Glucose, Bld 423 (*)    Calcium, Ion 1.14 (*)    Hemoglobin 16.0 (*)    HCT 47.0 (*)    All other components within normal limits  PROTIME-INR  APTT  BRAIN NATRIURETIC PEPTIDE  I-STAT TROPONIN, ED    EKG EKG Interpretation  Date/Time:  Thursday Feb 11 2018 11:15:38 EDT Ventricular Rate:  91 PR Interval:  182 QRS Duration: 94 QT Interval:  382 QTC Calculation: 469 R Axis:   67 Text Interpretation:  Sinus rhythm with Premature ventricular complexes or Fusion complexes Artifact ST-t wave abnormality Abnormal ekg Confirmed by Carmin Muskrat  [phone]) on 02/11/2018 12:59:29 PM   Radiology Ct Head Wo Contrast  Result Date: 02/11/2018 CLINICAL DATA:  Blurry vision.  Hypertension. EXAM: CT HEAD WITHOUT CONTRAST TECHNIQUE: Contiguous axial images were obtained from the base of the skull through the vertex without intravenous contrast. COMPARISON:  None. FINDINGS: Brain: No evidence of acute infarction, hemorrhage, hydrocephalus, or extra-axial collection. There is a 1.9 cm dural based slightly hyperdense mass along the high anterior left frontal convexity without mass effect on the underlying left superior frontal gyrus. Vascular: Atherosclerotic vascular calcification of the carotid siphons. No hyperdense vessel. Skull: Normal. Negative for fracture or focal lesion. Sinuses/Orbits: Mild mucosal thickening of the bilateral ethmoid air cells and maxillary sinuses. Small retention cyst in the right maxillary sinus. Partial opacification of the bilateral mastoid air cells. The orbits are unremarkable. Other: None. IMPRESSION: 1.  No acute intracranial abnormality. 2. 1.9 cm dural based slightly hyperdense mass along the high anterior left frontal convexity, likely a meningioma. No adjacent mass effect. Electronically Signed   By: Titus Dubin M.D.   On: 02/11/2018 12:58   Mr Brain Wo Contrast (neuro Protocol)  Result Date: 02/11/2018 CLINICAL DATA:  Altered level of consciousness, unexplained. Blurry vision and trouble speaking with left arm numbness beginning this morning. Symptoms have resolved except for the left arm numbness. EXAM: MRI HEAD WITHOUT CONTRAST TECHNIQUE: Multiplanar, multiecho pulse sequences of the brain and surrounding structures were obtained without intravenous contrast. COMPARISON:  Head CT from earlier today FINDINGS: Brain: No acute infarction, hemorrhage, hydrocephalus, extra-axial collection or mass effect. Dural-based mass along the high left frontal convexity measuring up to 19 x 10 mm, usually meningioma. No second mass is  seen. No chart history of malignancy. Very mild periventricular FLAIR hyperintensity. Vascular: Major flow voids are preserved. Skull and upper cervical spine: No evidence of marrow lesion Sinuses/Orbits: Partial right more than left mastoid opacification with negative nasopharynx, incidental based on the history and likely chronic given sclerosis by CT. Other: Overall mild motion artifact. IMPRESSION: 1.  No acute finding including infarct. 2. Dural-based mass along the left frontal convexity measuring 2 x 1 cm, usually meningioma. Electronically Signed   By: Monte Fantasia M.D.   On: 02/11/2018 16:05    Procedures Procedures (including critical care time)  Medications Ordered in ED Medications - No data to display   Initial Impression / Assessment and Plan / ED Course  I have reviewed the triage vital signs and the nursing notes.  Pertinent labs & imaging results that were available during my care of the patient were reviewed by me and considered in my medical decision making (see chart for details).      Initial results notable for meningioma on CT scan, hyperglycemia, though without substantial anion gap, without renal dysfunction.   5:20 PM On repeat exam the patient is awake, alert, interactive, with no ongoing complaints per With family present we had a lengthy conversation about all findings including CT and MRI evidence of meningioma, absence of stroke evidence. With no ongoing complaints, patient is appropriate for discharge. Patient does have subtherapeutic digoxin level, and hyperglycemia Patient acknowledges inconsistent compliance with her digoxin regimen, will begin taking this regularly, follow-up with primary care. With no ongoing neurologic complaints, the patient has appropriate medical management to minimize stroke risk with ongoing Brilinta therapy. Patient discharged in stable condition.  Final Clinical Impressions(s) / ED Diagnoses   Final diagnoses:  Blurred  vision  Meningioma [facility])     Carmin Muskrat, MD 02/11/18 1721

## 2018-05-02 ENCOUNTER — Inpatient Hospital Stay (HOSPITAL_COMMUNITY)
Admission: EM | Admit: 2018-05-02 | Discharge: 2018-05-05 | DRG: 098 | Disposition: A | Discharge status: Home / Self Care | Payer: Self-pay | Attending: Internal Medicine | Admitting: Internal Medicine

## 2018-05-02 ENCOUNTER — Encounter (HOSPITAL_COMMUNITY): Payer: Self-pay | Admitting: Emergency Medicine

## 2018-05-02 DIAGNOSIS — I10 Essential (primary) hypertension: Secondary | ICD-10-CM | POA: Diagnosis present

## 2018-05-02 DIAGNOSIS — G4733 Obstructive sleep apnea (adult) (pediatric): Secondary | ICD-10-CM | POA: Diagnosis present

## 2018-05-02 DIAGNOSIS — G0490 Encephalitis and encephalomyelitis, unspecified: Principal | ICD-10-CM | POA: Diagnosis present

## 2018-05-02 DIAGNOSIS — Z7982 Long term (current) use of aspirin: Secondary | ICD-10-CM

## 2018-05-02 DIAGNOSIS — E871 Hypo-osmolality and hyponatremia: Secondary | ICD-10-CM | POA: Diagnosis present

## 2018-05-02 DIAGNOSIS — R569 Unspecified convulsions: Secondary | ICD-10-CM

## 2018-05-02 DIAGNOSIS — I5022 Chronic systolic (congestive) heart failure: Secondary | ICD-10-CM | POA: Diagnosis present

## 2018-05-02 DIAGNOSIS — I2510 Atherosclerotic heart disease of native coronary artery without angina pectoris: Secondary | ICD-10-CM | POA: Diagnosis present

## 2018-05-02 DIAGNOSIS — Z955 Presence of coronary angioplasty implant and graft: Secondary | ICD-10-CM

## 2018-05-02 DIAGNOSIS — I130 Hypertensive heart and chronic kidney disease with heart failure and stage 1 through stage 4 chronic kidney disease, or unspecified chronic kidney disease: Secondary | ICD-10-CM | POA: Diagnosis present

## 2018-05-02 DIAGNOSIS — Z87891 Personal history of nicotine dependence: Secondary | ICD-10-CM

## 2018-05-02 DIAGNOSIS — I429 Cardiomyopathy, unspecified: Secondary | ICD-10-CM | POA: Diagnosis present

## 2018-05-02 DIAGNOSIS — G4089 Other seizures: Secondary | ICD-10-CM | POA: Diagnosis present

## 2018-05-02 DIAGNOSIS — R9431 Abnormal electrocardiogram [ECG] [EKG]: Secondary | ICD-10-CM

## 2018-05-02 DIAGNOSIS — Z7984 Long term (current) use of oral hypoglycemic drugs: Secondary | ICD-10-CM

## 2018-05-02 DIAGNOSIS — N182 Chronic kidney disease, stage 2 (mild): Secondary | ICD-10-CM | POA: Diagnosis present

## 2018-05-02 DIAGNOSIS — E1122 Type 2 diabetes mellitus with diabetic chronic kidney disease: Secondary | ICD-10-CM | POA: Diagnosis present

## 2018-05-02 DIAGNOSIS — E119 Type 2 diabetes mellitus without complications: Secondary | ICD-10-CM

## 2018-05-02 DIAGNOSIS — Z79899 Other long term (current) drug therapy: Secondary | ICD-10-CM

## 2018-05-02 HISTORY — DX: Unspecified convulsions: R56.9

## 2018-05-02 HISTORY — DX: Personal history of urinary calculi: Z87.442

## 2018-05-02 HISTORY — DX: Malignant neoplasm of cervix uteri, unspecified: C53.9

## 2018-05-02 HISTORY — DX: Depression, unspecified: F32.A

## 2018-05-02 HISTORY — DX: Acute myocardial infarction, unspecified: I21.9

## 2018-05-02 HISTORY — DX: Major depressive disorder, single episode, unspecified: F32.9

## 2018-05-02 HISTORY — DX: Type 2 diabetes mellitus without complications: E11.9

## 2018-05-02 MED ORDER — SODIUM CHLORIDE 0.9 % IV SOLN
INTRAVENOUS | Status: DC
  Administered 2018-05-03 – 2018-05-05 (×4): via INTRAVENOUS

## 2018-05-02 MED ORDER — SODIUM CHLORIDE 0.9 % IV BOLUS
1000.0000 mL | Freq: Once | INTRAVENOUS | Status: AC
  Administered 2018-05-03: 1000 mL via INTRAVENOUS

## 2018-05-02 NOTE — ED Triage Notes (Signed)
Pt arrives via EMS from home with c/o new onset seizures, per EMS she had two thirty second seizure - one witnessed by EMS. IV established in the field and 2.5mg  versed given. Pt responsive to painful stimuli, reflexes intact upon arrival to ED. Pupils 68mm sluggish. Spouse at bedside. Hypertensive.

## 2018-05-03 ENCOUNTER — Other Ambulatory Visit: Payer: Self-pay

## 2018-05-03 ENCOUNTER — Emergency Department (HOSPITAL_COMMUNITY): Payer: Self-pay

## 2018-05-03 ENCOUNTER — Encounter (HOSPITAL_COMMUNITY): Payer: Self-pay | Admitting: Internal Medicine

## 2018-05-03 ENCOUNTER — Observation Stay (HOSPITAL_COMMUNITY): Payer: Self-pay

## 2018-05-03 DIAGNOSIS — E871 Hypo-osmolality and hyponatremia: Secondary | ICD-10-CM | POA: Diagnosis present

## 2018-05-03 DIAGNOSIS — R569 Unspecified convulsions: Secondary | ICD-10-CM

## 2018-05-03 LAB — CBC WITH DIFFERENTIAL/PLATELET
Abs Immature Granulocytes: 0.1 10*3/uL (ref 0.0–0.1)
Basophils Absolute: 0.1 10*3/uL (ref 0.0–0.1)
Basophils Relative: 0 %
EOS ABS: 0.5 10*3/uL (ref 0.0–0.7)
EOS PCT: 4 %
HEMATOCRIT: 41.2 % (ref 36.0–46.0)
Hemoglobin: 13.5 g/dL (ref 12.0–15.0)
IMMATURE GRANULOCYTES: 1 %
LYMPHS ABS: 3.4 10*3/uL (ref 0.7–4.0)
Lymphocytes Relative: 25 %
MCH: 28.4 pg (ref 26.0–34.0)
MCHC: 32.8 g/dL (ref 30.0–36.0)
MCV: 86.7 fL (ref 78.0–100.0)
MONOS PCT: 5 %
Monocytes Absolute: 0.6 10*3/uL (ref 0.1–1.0)
NEUTROS PCT: 65 %
Neutro Abs: 9 10*3/uL — ABNORMAL HIGH (ref 1.7–7.7)
PLATELETS: 512 10*3/uL — AB (ref 150–400)
RBC: 4.75 MIL/uL (ref 3.87–5.11)
RDW: 12.9 % (ref 11.5–15.5)
WBC: 13.7 10*3/uL — AB (ref 4.0–10.5)

## 2018-05-03 LAB — CSF CELL COUNT WITH DIFFERENTIAL
RBC Count, CSF: 101 /mm3 — ABNORMAL HIGH
Tube #: 3
WBC, CSF: 5 /mm3 (ref 0–5)

## 2018-05-03 LAB — URINALYSIS, ROUTINE W REFLEX MICROSCOPIC
Bacteria, UA: NONE SEEN
Bilirubin Urine: NEGATIVE
HGB URINE DIPSTICK: NEGATIVE
Ketones, ur: NEGATIVE mg/dL
Leukocytes, UA: NEGATIVE
Nitrite: NEGATIVE
PROTEIN: 30 mg/dL — AB
SPECIFIC GRAVITY, URINE: 1.012 (ref 1.005–1.030)
pH: 5 (ref 5.0–8.0)

## 2018-05-03 LAB — COMPREHENSIVE METABOLIC PANEL
ALT: 16 U/L (ref 0–44)
AST: 19 U/L (ref 15–41)
Albumin: 3 g/dL — ABNORMAL LOW (ref 3.5–5.0)
Alkaline Phosphatase: 116 U/L (ref 38–126)
Anion gap: 12 (ref 5–15)
BILIRUBIN TOTAL: 0.4 mg/dL (ref 0.3–1.2)
BUN: 19 mg/dL (ref 8–23)
CHLORIDE: 97 mmol/L — AB (ref 98–111)
CO2: 25 mmol/L (ref 22–32)
CREATININE: 1.03 mg/dL — AB (ref 0.44–1.00)
Calcium: 9.2 mg/dL (ref 8.9–10.3)
GFR calc Af Amer: >60 mL/min (ref >60)
GFR, EST NON AFRICAN AMERICAN: 57 mL/min — AB (ref >60)
Glucose, Bld: 355 mg/dL — ABNORMAL HIGH (ref 70–99)
Potassium: 4.1 mmol/L (ref 3.5–5.1)
SODIUM: 134 mmol/L — AB (ref 135–145)
Total Protein: 7 g/dL (ref 6.5–8.1)

## 2018-05-03 LAB — RAPID URINE DRUG SCREEN, HOSP PERFORMED
Amphetamines: NOT DETECTED
BENZODIAZEPINES: POSITIVE — AB
Barbiturates: NOT DETECTED
Cocaine: NOT DETECTED
Opiates: NOT DETECTED
Tetrahydrocannabinol: NOT DETECTED

## 2018-05-03 LAB — CBG MONITORING, ED
Glucose-Capillary: 282 mg/dL — ABNORMAL HIGH (ref 70–99)
Glucose-Capillary: 369 mg/dL — ABNORMAL HIGH (ref 70–99)
Glucose-Capillary: 406 mg/dL — ABNORMAL HIGH (ref 70–99)

## 2018-05-03 LAB — GLUCOSE, CSF: GLUCOSE CSF: 189 mg/dL — AB (ref 40–70)

## 2018-05-03 LAB — HIV ANTIBODY (ROUTINE TESTING W REFLEX): HIV Screen 4th Generation wRfx: NONREACTIVE

## 2018-05-03 LAB — I-STAT CHEM 8, ED
BUN: 23 mg/dL (ref 8–23)
CALCIUM ION: 1.16 mmol/L (ref 1.15–1.40)
CREATININE: 0.9 mg/dL (ref 0.44–1.00)
Chloride: 97 mmol/L — ABNORMAL LOW (ref 98–111)
GLUCOSE: 362 mg/dL — AB (ref 70–99)
HCT: 40 % (ref 36.0–46.0)
HEMOGLOBIN: 13.6 g/dL (ref 12.0–15.0)
POTASSIUM: 4.2 mmol/L (ref 3.5–5.1)
Sodium: 133 mmol/L — ABNORMAL LOW (ref 135–145)
TCO2: 27 mmol/L (ref 22–32)

## 2018-05-03 LAB — MAGNESIUM: MAGNESIUM: 1.7 mg/dL (ref 1.7–2.4)

## 2018-05-03 LAB — GLUCOSE, CAPILLARY
GLUCOSE-CAPILLARY: 246 mg/dL — AB (ref 70–99)
Glucose-Capillary: 246 mg/dL — ABNORMAL HIGH (ref 70–99)

## 2018-05-03 LAB — ETHANOL: Alcohol, Ethyl (B): <10 mg/dL (ref <10)

## 2018-05-03 LAB — DIGOXIN LEVEL: Digoxin Level: <0.2 ng/mL — ABNORMAL LOW (ref 0.8–2.0)

## 2018-05-03 LAB — PROTEIN, CSF: TOTAL PROTEIN, CSF: 88 mg/dL — AB (ref 15–45)

## 2018-05-03 LAB — MRSA PCR SCREENING: MRSA BY PCR: NEGATIVE

## 2018-05-03 MED ORDER — LEVETIRACETAM IN NACL 1000 MG/100ML IV SOLN
1000.0000 mg | Freq: Once | INTRAVENOUS | Status: AC
  Administered 2018-05-03: 1000 mg via INTRAVENOUS
  Filled 2018-05-03: qty 100

## 2018-05-03 MED ORDER — FUROSEMIDE 20 MG PO TABS
20.0000 mg | ORAL_TABLET | ORAL | Status: DC
  Administered 2018-05-04: 20 mg via ORAL
  Filled 2018-05-03: qty 1

## 2018-05-03 MED ORDER — ENOXAPARIN SODIUM 40 MG/0.4ML ~~LOC~~ SOLN
40.0000 mg | SUBCUTANEOUS | Status: DC
  Administered 2018-05-03 – 2018-05-05 (×3): 40 mg via SUBCUTANEOUS
  Filled 2018-05-03 (×4): qty 0.4

## 2018-05-03 MED ORDER — CARVEDILOL 25 MG PO TABS
25.0000 mg | ORAL_TABLET | Freq: Every day | ORAL | Status: DC
  Administered 2018-05-03 – 2018-05-05 (×3): 25 mg via ORAL
  Filled 2018-05-03: qty 2
  Filled 2018-05-03 (×2): qty 1

## 2018-05-03 MED ORDER — INSULIN ASPART 100 UNIT/ML ~~LOC~~ SOLN
0.0000 [IU] | Freq: Every day | SUBCUTANEOUS | Status: DC
  Administered 2018-05-03: 2 [IU] via SUBCUTANEOUS

## 2018-05-03 MED ORDER — LORAZEPAM 2 MG/ML IJ SOLN: 1.0000 mg | INTRAMUSCULAR | Status: DC | PRN

## 2018-05-03 MED ORDER — OXYCODONE HCL 5 MG PO TABS
5.0000 mg | ORAL_TABLET | Freq: Once | ORAL | Status: DC
  Filled 2018-05-03 (×2): qty 1

## 2018-05-03 MED ORDER — LEVETIRACETAM IN NACL 500 MG/100ML IV SOLN
500.0000 mg | Freq: Two times a day (BID) | INTRAVENOUS | Status: DC
  Filled 2018-05-03 (×3): qty 100

## 2018-05-03 MED ORDER — ENSURE ENLIVE PO LIQD
237.0000 mL | Freq: Two times a day (BID) | ORAL | Status: DC
  Administered 2018-05-04 – 2018-05-05 (×3): 237 mL via ORAL

## 2018-05-03 MED ORDER — INSULIN ASPART 100 UNIT/ML ~~LOC~~ SOLN
0.0000 [IU] | Freq: Three times a day (TID) | SUBCUTANEOUS | Status: DC
  Administered 2018-05-03: 7 [IU] via SUBCUTANEOUS
  Administered 2018-05-03: 3 [IU] via SUBCUTANEOUS
  Administered 2018-05-03 – 2018-05-04 (×2): 9 [IU] via SUBCUTANEOUS
  Administered 2018-05-04 (×2): 5 [IU] via SUBCUTANEOUS
  Administered 2018-05-05: 7 [IU] via SUBCUTANEOUS
  Administered 2018-05-05: 3 [IU] via SUBCUTANEOUS
  Administered 2018-05-05: 5 [IU] via SUBCUTANEOUS
  Filled 2018-05-03 (×2): qty 1

## 2018-05-03 MED ORDER — MAGNESIUM SULFATE 2 GM/50ML IV SOLN
2.0000 g | Freq: Once | INTRAVENOUS | Status: AC
  Administered 2018-05-03: 2 g via INTRAVENOUS
  Filled 2018-05-03: qty 50

## 2018-05-03 MED ORDER — ONDANSETRON HCL 4 MG PO TABS: 4.0000 mg | ORAL_TABLET | Freq: Four times a day (QID) | ORAL | Status: DC | PRN

## 2018-05-03 MED ORDER — LIDOCAINE HCL (PF) 1 % IJ SOLN
5.0000 mL | Freq: Once | INTRAMUSCULAR | Status: AC
  Administered 2018-05-03: 5 mL via INTRADERMAL

## 2018-05-03 MED ORDER — ACETAMINOPHEN 325 MG PO TABS: 650.0000 mg | ORAL_TABLET | Freq: Four times a day (QID) | ORAL | Status: DC | PRN

## 2018-05-03 MED ORDER — THIAMINE HCL 100 MG/ML IJ SOLN
500.0000 mg | Freq: Three times a day (TID) | INTRAVENOUS | Status: DC
  Administered 2018-05-03: 500 mg via INTRAVENOUS
  Filled 2018-05-03 (×3): qty 5

## 2018-05-03 MED ORDER — THIAMINE HCL 100 MG/ML IJ SOLN
500.0000 mg | Freq: Three times a day (TID) | INTRAVENOUS | Status: DC
  Administered 2018-05-04 – 2018-05-05 (×6): 500 mg via INTRAVENOUS
  Filled 2018-05-03 (×7): qty 5

## 2018-05-03 MED ORDER — TICAGRELOR 90 MG PO TABS
90.0000 mg | ORAL_TABLET | Freq: Two times a day (BID) | ORAL | Status: DC
  Administered 2018-05-04 – 2018-05-05 (×3): 90 mg via ORAL
  Filled 2018-05-03 (×4): qty 1

## 2018-05-03 MED ORDER — LORAZEPAM 2 MG/ML IJ SOLN
2.0000 mg | Freq: Once | INTRAMUSCULAR | Status: DC
Start: 2018-05-03 — End: 2018-05-05
  Filled 2018-05-03: qty 1

## 2018-05-03 MED ORDER — MORPHINE SULFATE (PF) 4 MG/ML IV SOLN
4.0000 mg | Freq: Once | INTRAVENOUS | Status: AC
Start: 2018-05-03 — End: 2018-05-03
  Administered 2018-05-03: 4 mg via INTRAVENOUS
  Filled 2018-05-03: qty 1

## 2018-05-03 MED ORDER — DEXTROSE 5 % IV SOLN
10.0000 mg/kg | Freq: Three times a day (TID) | INTRAVENOUS | Status: DC
  Administered 2018-05-03 – 2018-05-05 (×6): 680 mg via INTRAVENOUS
  Filled 2018-05-03 (×7): qty 13.6

## 2018-05-03 MED ORDER — LEVETIRACETAM IN NACL 500 MG/100ML IV SOLN
500.0000 mg | Freq: Two times a day (BID) | INTRAVENOUS | Status: DC
  Administered 2018-05-03 – 2018-05-05 (×4): 500 mg via INTRAVENOUS
  Filled 2018-05-03 (×6): qty 100

## 2018-05-03 MED ORDER — LIDOCAINE HCL (PF) 1 % IJ SOLN
INTRAMUSCULAR | Status: AC
  Administered 2018-05-03: 11:00:00
  Filled 2018-05-03: qty 5

## 2018-05-03 MED ORDER — DIGOXIN 125 MCG PO TABS
125.0000 ug | ORAL_TABLET | Freq: Every day | ORAL | Status: DC
  Administered 2018-05-04 – 2018-05-05 (×2): 125 ug via ORAL
  Filled 2018-05-03 (×2): qty 1

## 2018-05-03 MED ORDER — GABAPENTIN 300 MG PO CAPS
300.0000 mg | ORAL_CAPSULE | Freq: Once | ORAL | Status: AC
  Administered 2018-05-03: 300 mg via ORAL
  Filled 2018-05-03: qty 1

## 2018-05-03 MED ORDER — SENNOSIDES-DOCUSATE SODIUM 8.6-50 MG PO TABS: 1.0000 | ORAL_TABLET | Freq: Every evening | ORAL | Status: DC | PRN

## 2018-05-03 MED ORDER — CARVEDILOL 12.5 MG PO TABS
12.5000 mg | ORAL_TABLET | Freq: Every day | ORAL | Status: DC
  Administered 2018-05-03 – 2018-05-05 (×3): 12.5 mg via ORAL
  Filled 2018-05-03 (×3): qty 1

## 2018-05-03 MED ORDER — ONDANSETRON HCL 4 MG/2ML IJ SOLN: 4.0000 mg | Freq: Four times a day (QID) | INTRAMUSCULAR | Status: DC | PRN

## 2018-05-03 MED ORDER — ACETAMINOPHEN 650 MG RE SUPP: 650.0000 mg | Freq: Four times a day (QID) | RECTAL | Status: DC | PRN

## 2018-05-03 NOTE — ED Notes (Signed)
Etta Quill PA-C at bedside for lumbar puncture

## 2018-05-03 NOTE — ED Notes (Signed)
Report taken from off going RN - care assumed at this time - resting quietly on stretcher with eyes closed - opens eyes and verbally responds with loud verbal stimuli; resp even, nonlabored, HOB elevated 30 degrees; ccm showing SR rate 92 without ectopy; husband at bedside; awaiting inpt bed assignment - husband aware of same - seizure pads in place

## 2018-05-03 NOTE — Progress Notes (Signed)
Called ED to get report Larene [place] to call back.

## 2018-05-03 NOTE — Consult Note (Signed)
NEURO HOSPITALIST CONSULT NOTE   Requesting physician: Dr. Lorin Mercy  Reason for Consult: New onset seizures  History obtained from:  Patient, Husband and Chart     HPI:                                                                                                                                          Sally Hendrix is an 62 y.o. female who presents with new onset seizures x 2. She was at home with her husband when he heard a thump and then went to check on her. She was on the floor with generalized stiffening of her body along with some shaking, which looked like a seizure per husband and lasted for about 30 seconds. She was unresponsive during the event. EMS was called and on arrival she was confused and combative. She was bleeding from her tongue, consistent with bite injury. She had a second thirty-second seizure witnessed by EMS, about 20 minutes afterwards. During this event, she stiffened, brought arms over her head, with head turned to the right followed by tonic-clonic seizure activity lasting about 3 minutes. 2.5 mg versed was given by EMS. She returned to baseline in the ED, then had a third seizure.   She has no prior history of seizures. She does not abuse alcohol and is not on any new medications per husband. She states that she has had a "bad cold" for the past 3 weeks, has been much more sleepy than normal and has had intermittent headaches. She also has been confused for the past 3 days. Symptoms of confusion have included memory loss and an episode where she was unable to correctly spread butter on pancakes she was eating.    MRI was obtained in the ED, revealing bilateral subtle increased signal in the hippocampi as well as within the medial hypothalamus bilaterally; the findings are symmetric. Also noted was a small convexity meningioma.   Keppra 1000 mg was loaded IV and she has been started on scheduled Keppra at 500 mg BID.   Past Medical History:   Diagnosis Date  . CHF (congestive heart failure) ([place])   . Diabetes mellitus   . Hypertension   . Nonischemic cardiomyopathy ([place])   . Obesity   . Seizure ([place])   . Sleep apnea     Past Surgical History:  Procedure Laterality Date  . CARDIAC CATHETERIZATION    . CORONARY STENT INTERVENTION N/A 12/10/2016   Procedure: Coronary Stent Intervention;  Surgeon: Wellington Hampshire, MD;  Location: [facility];  Service: Cardiovascular;  Laterality: N/A;  . LEFT HEART CATH AND CORONARY ANGIOGRAPHY N/A 12/10/2016   Procedure: Left Heart Cath and Coronary Angiography;  Surgeon: Wellington Hampshire, MD;  Location: [facility];  Service: Cardiovascular;  Laterality: N/A;    Family History  Problem Relation Age of Onset  . Heart failure Mother        hx of - died of nonalcoholic cirrhosis  . Coronary artery disease Father 61       alive  . Other Brother        brain tumor             Social History:  reports that she has quit smoking. She has never used smokeless tobacco. She reports that she drinks alcohol. She reports that she does not use drugs.  No Known Allergies  MEDICATIONS:                                                                                                                     Current Meds  Medication Sig  . aspirin 81 MG tablet Take 1 tablet (81 mg total) by mouth daily.  . carvedilol (COREG) 25 MG tablet Take 1.5 tablets (37.5 mg total) by mouth 2 (two) times daily. (Patient taking differently: Take 12.5-25 mg by mouth See admin instructions. Take 1 tablet (25 mg totally) in the morning; take 0.5 tablet (12.5 mg totally) in the evening)  . digoxin (LANOXIN) 0.125 MG tablet Take 1 tablet (125 mcg total) by mouth daily.  . furosemide (LASIX) 20 MG tablet Take 1 tablet (20 mg total) by mouth daily. (Patient taking differently: Take 20 mg by mouth every other day. )  . gabapentin (NEURONTIN) 300 MG capsule Take 1 capsule (300 mg total) by mouth at bedtime. (Patient  taking differently: Take 300 mg by mouth daily as needed (for pain). )  . losartan (COZAAR) 100 MG tablet Take 1 tablet (100 mg total) by mouth daily.  . metFORMIN (GLUCOPHAGE) 500 MG tablet Take 500 mg by mouth daily with breakfast.  . nitroGLYCERIN (NITROSTAT) 0.4 MG SL tablet Place 1 tablet (0.4 mg total) under the tongue every 5 (five) minutes as needed for chest pain.  Marland Kitchen spironolactone (ALDACTONE) 25 MG tablet Take 1 tablet (25 mg total) by mouth daily. (Patient taking differently: Take 25 mg by mouth daily as needed (when taking Lasix). )  . ticagrelor (BRILINTA) 90 MG TABS tablet Take 1 tablet (90 mg total) by mouth 2 (two) times daily.      ROS:  Endorses some new difficulty with vision, but unable to specify further. No neck pain or dysphasia. Other ROS as per HPI.    Blood pressure (!) 153/90, pulse 92, temperature 97.7 F (36.5 C), temperature source Oral, resp. rate 19, height 5\' 6"  (1.676 m), weight 68 kg (150 lb), SpO2 96 %.   General Examination:                                                                                                       Physical Exam  HEENT-  [place]/AT    Lungs- Respirations unlabored Extremities- No edema  Neurological Examination Mental Status: Drowsy. Fully oriented with fluent speech and intact comprehension. Able to follow all commands. Affect dysthymic.  Cranial Nerves: II: Visual fields intact without extinction to DSS  III,IV, VI: Ptosis not present, EOMI without nystagmus.  V,VII: Smile symmetric, facial temp sensation intact bilaterally VIII: hearing intact to voice IX,X: No hypophonia XI: Symmetric XII: midline tongue extension. Tongue bite noted.  Motor: Right : Upper extremity   4+/5    Left:     Upper extremity   4+/5  Lower extremity   4+/5    Lower extremity   4+/5 Diffuse fatiguability with  poor effort noted.  Sensory: Temp and light touch intact throughout, bilaterally. No extinction.  Deep Tendon Reflexes: 2+ and symmetric throughout Plantars: Right: downgoing   Left: downgoing Cerebellar: Normal FNF bilaterally.  Gait: Deferred.    Lab Results: Basic Metabolic Panel: Recent Labs  Lab 05/02/18 2357 05/03/18 0002  NA 134* 133*  K 4.1 4.2  CL 97* 97*  CO2 25  --   GLUCOSE 355* 362*  BUN 19 23  CREATININE 1.03* 0.90  CALCIUM 9.2  --   MG 1.7  --     CBC: Recent Labs  Lab 05/02/18 2357 05/03/18 0002  WBC 13.7*  --   NEUTROABS 9.0*  --   HGB 13.5 13.6  HCT 41.2 40.0  MCV 86.7  --   PLT 512*  --     Cardiac Enzymes: No results for input(s): CKTOTAL, CKMB, CKMBINDEX, TROPONINI in the last 168 hours.  Lipid Panel: No results for input(s): CHOL, TRIG, HDL, CHOLHDL, VLDL, LDLCALC in the last 168 hours.  Imaging: Ct Head Wo Contrast  Result Date: 05/03/2018 CLINICAL DATA:  New onset witnessed seizures. Hypertensive. History of diabetes. EXAM: CT HEAD WITHOUT CONTRAST CT CERVICAL SPINE WITHOUT CONTRAST TECHNIQUE: Multidetector CT imaging of the head and cervical spine was performed following the standard protocol without intravenous contrast. Multiplanar CT image reconstructions of the cervical spine were also generated. COMPARISON:  MRI of the head April 13, 2018 FINDINGS: CT HEAD FINDINGS BRAIN: No intraparenchymal hemorrhage, mass effect nor midline shift. The ventricles and sulci are normal. No acute large vascular territory infarcts. No abnormal extra-axial fluid collections. Basal cisterns are patent. 18 x 10 mm LEFT frontal extra-axial mass previously characterized as meningioma without significant mass effect. VASCULAR: Mild calcific atherosclerosis carotid siphons. SKULL/SOFT TISSUES: No skull fracture. Small LEFT parietal scalp hematoma without subcutaneous gas or radiopaque foreign bodies. ORBITS/SINUSES:  The included ocular globes and orbital contents are  normal.Mild paranasal sinus mucosal thickening. Bilateral mastoid effusions. No air cell coalescence. OTHER: None. CT CERVICAL SPINE FINDINGS-moderately motion degraded examination. ALIGNMENT: Straightened cervical lordosis. Grade 1 C3-4 anterolisthesis. SKULL BASE AND VERTEBRAE: Vertebral bodies intact. Severe C5-6 disc height loss with proportional endplate spurring compatible with degenerative discs, moderate to severe at C6-7 and moderate at C7-T1. C1-2 articulation maintained with moderate arthropathy. Multilevel moderate facet arthropathy. No destructive bony lesions. SOFT TISSUES AND SPINAL CANAL: Nonacute. Mild calcific atherosclerosis carotid bifurcations. DISC LEVELS: Severe C3-4 neural foraminal narrowing. Moderate to severe LEFT C5-6 and LEFT C6-7 neural foraminal narrowing. Mild canal stenosis C5-6 and C6-7. UPPER CHEST: Lung apices are clear. OTHER: None. IMPRESSION: CT HEAD: 1. No acute intracranial process.  Small LEFT scalp hematoma. 2. Redemonstration of 18 x 10 mm LEFT frontal meningioma without mass effect. 3. Otherwise negative non-contrast CT HEAD for age. CT CERVICAL SPINE: 1. Moderately motion degraded examination. 2. No fracture.  Grade 1 C3-4 anterolisthesis. 3. Severe C3-4 neural foraminal narrowing. Electronically Signed   By: Elon Alas M.D.   On: 05/03/2018 01:00   Ct Cervical Spine Wo Contrast  Result Date: 05/03/2018 CLINICAL DATA:  New onset witnessed seizures. Hypertensive. History of diabetes. EXAM: CT HEAD WITHOUT CONTRAST CT CERVICAL SPINE WITHOUT CONTRAST TECHNIQUE: Multidetector CT imaging of the head and cervical spine was performed following the standard protocol without intravenous contrast. Multiplanar CT image reconstructions of the cervical spine were also generated. COMPARISON:  MRI of the head April 13, 2018 FINDINGS: CT HEAD FINDINGS BRAIN: No intraparenchymal hemorrhage, mass effect nor midline shift. The ventricles and sulci are normal. No acute large  vascular territory infarcts. No abnormal extra-axial fluid collections. Basal cisterns are patent. 18 x 10 mm LEFT frontal extra-axial mass previously characterized as meningioma without significant mass effect. VASCULAR: Mild calcific atherosclerosis carotid siphons. SKULL/SOFT TISSUES: No skull fracture. Small LEFT parietal scalp hematoma without subcutaneous gas or radiopaque foreign bodies. ORBITS/SINUSES: The included ocular globes and orbital contents are normal.Mild paranasal sinus mucosal thickening. Bilateral mastoid effusions. No air cell coalescence. OTHER: None. CT CERVICAL SPINE FINDINGS-moderately motion degraded examination. ALIGNMENT: Straightened cervical lordosis. Grade 1 C3-4 anterolisthesis. SKULL BASE AND VERTEBRAE: Vertebral bodies intact. Severe C5-6 disc height loss with proportional endplate spurring compatible with degenerative discs, moderate to severe at C6-7 and moderate at C7-T1. C1-2 articulation maintained with moderate arthropathy. Multilevel moderate facet arthropathy. No destructive bony lesions. SOFT TISSUES AND SPINAL CANAL: Nonacute. Mild calcific atherosclerosis carotid bifurcations. DISC LEVELS: Severe C3-4 neural foraminal narrowing. Moderate to severe LEFT C5-6 and LEFT C6-7 neural foraminal narrowing. Mild canal stenosis C5-6 and C6-7. UPPER CHEST: Lung apices are clear. OTHER: None. IMPRESSION: CT HEAD: 1. No acute intracranial process.  Small LEFT scalp hematoma. 2. Redemonstration of 18 x 10 mm LEFT frontal meningioma without mass effect. 3. Otherwise negative non-contrast CT HEAD for age. CT CERVICAL SPINE: 1. Moderately motion degraded examination. 2. No fracture.  Grade 1 C3-4 anterolisthesis. 3. Severe C3-4 neural foraminal narrowing. Electronically Signed   By: Elon Alas M.D.   On: 05/03/2018 01:00   Mr Brain Wo Contrast  Result Date: 05/03/2018 CLINICAL DATA:  New onset seizures, altered mental status. History of hypertension, diabetes. EXAM: MRI HEAD  WITHOUT CONTRAST TECHNIQUE: Multiplanar, multiecho pulse sequences of the brain and surrounding structures were obtained without intravenous contrast. COMPARISON:  CT HEAD May 03, 2018 and MRI of the head Feb 11, 2018. FINDINGS: Moderately motion degraded examination. INTRACRANIAL CONTENTS:  Slightly greater than expected reduced diffusion bilateral hippocampi with FLAIR T2 hyperintense signal though inconsistent between sequences. No ADC abnormality. No midline shift or mass effect. No parenchymal brain volume loss for age. No hydrocephalus. No abnormal extra-axial fluid collections. Similar appearance of 18 x 10 mm low signal LEFT frontal dural based mass consistent with meningioma. VASCULAR: Normal major intracranial vascular flow voids present at skull base. SKULL AND UPPER CERVICAL SPINE: No abnormal sellar expansion. No suspicious calvarial bone marrow signal. Craniocervical junction maintained. Small LEFT frontal scalp hematoma. SINUSES/ORBITS: Mild paranasal sinus mucosal thickening. Bilateral mastoid effusions.The included ocular globes and orbital contents are non-suspicious. OTHER: None. IMPRESSION: 1. Moderately motion degraded examination. 2. Signal abnormality bilateral hippocampi favored as artifact. Though, given new onset seizures this may be postictal; infectious (HSV) or inflammatory encephalitis should be excluded. Recommend correlation with CSF studies. Repeat MRI with contrast could be considered, with sedation. 3. Redemonstration of 18 x 10 mm LEFT frontal meningioma without mass effect. 4. Acute findings discussed with and reconfirmed by PA Joaquin Courts on 05/03/2018 at 5:08 am. Electronically Signed   By: Elon Alas M.D.   On: 05/03/2018 05:11    Assessment: 1. Exam reveals drowsiness but intact orientation, fluent speech and intact speech comprehension. Although exam is nonfocal, given her drowsiness for several days with intermittent headaches, new onset confusion, elevated white  count and constitutional symptoms, an infectious, toxic or autoimmune process is on the DDx.  2. MRI findings of signal abnormality in the bilateral hippocampi and medial hypothalami are noted. Agree with Radiology interpretation that the findings may be postictal; infectious (HSV) or inflammatory. Limbic encephalitis is on the DDx.  3. On personal review of the MRI, the findings also appear compatible with a possible Wernicke's encephalopathy given symmetric abnormal T2 hyperintensity in the region surrounding the mamillary bodies bilaterally.   Recommendations: 1. LP attempted but unsuccessful. Will need fluoro guided LP to obtain the following: Cell count with differential, protein, glucose, IgG index and HSV PCR.  2. Start empiric acyclovir for possible herpes encephalitis. Can discontinue if LP shows normal cell count and negative HSV PCR.  3. EEG (ordered).  4. Thiamine level has been ordered. After it has been drawn, start empiric thiamine at 500 mg IV TID x 3 days, followed by 250 mg IV TID x 3 days, then 100 mg po qd thereafter.  5. Has been loaded with Keppra 1000 mg IV x 1. Continue at 500 mg IV BID.    Electronically signed: Dr. Kerney Elbe 05/03/2018, 11:06 AM

## 2018-05-03 NOTE — Procedures (Signed)
ELECTROENCEPHALOGRAM REPORT   Patient: Sally Hendrix       Room #: C26C EEG No. ID: [id number] Age: 62 y.o.        Sex: female Referring Physician: Lorin Mercy Report Date:  05/03/2018        Interpreting Physician: Alexis Goodell  History: Sally Hendrix is an 62 y.o. female with seizure  Medications:  Coreg, Lanoxin, Lasix, Insulin, Keppra, Brilinta, Ativan  Conditions of Recording:  This is a 21 channel routine scalp EEG performed with bipolar and monopolar montages arranged in accordance to the international 10/20 system of electrode placement. One channel was dedicated to EKG recording.  The patient is in the awake, drowsy and asleep states.  Description:  The waking background activity consists of a low voltage, symmetrical, fairly well organized, 7 Hz theta activity, seen from the parieto-occipital and posterior temporal regions.  Low voltage fast activity, poorly organized, is seen anteriorly and is at times superimposed on more posterior regions.  A mixture of theta and alpha rhythms are seen from the central and temporal regions. The patient drowses with slowing to irregular, low voltage theta and beta activity.   The patient goes in to a light sleep with symmetrical sleep spindles, vertex central sharp transients and irregular slow activity.   Hyperventilation was not performed.   Intermittent photic stimulation was performed but failed to illicit any change in the tracing.    IMPRESSION: This is an abnormal EEG secondary to posterior background slowing.  This finding may be seen with a diffuse gray matter disturbance that is etiologically nonspecific, but may include a dementia or post-ictal effect, among other possibilities.  No epileptiform activity is noted.     Alexis Goodell, MD Neurology [phone] 05/03/2018, 11:44 AM

## 2018-05-03 NOTE — Progress Notes (Signed)
EEG completed; results pending.    

## 2018-05-03 NOTE — ED Notes (Signed)
Purwick placed on pt

## 2018-05-03 NOTE — ED Notes (Signed)
Lumbar puncture unsuccessful. Pt will go to IR for LP. Pt tolerated procedure well.

## 2018-05-03 NOTE — ED Notes (Signed)
Pt. Found to have had another seizure. Claiborne Billings, [place] made aware. Kelly at bedside. Verbal order for Keppra in place. Will continue to monitor.

## 2018-05-03 NOTE — H&P (Addendum)
History and Physical    Sally Hendrix JOA:[id number] DOB: Aug 28, 1956 DOA: 05/02/2018  PCP: Leanna Battles, MD Patient coming from: home  Chief Complaint: seizure  HPI: Sally Hendrix is a 62 y.o. female with medical history significant for non-specific cardiomyopathy, htn, dm,sleep apnea presents to the emergency department with the chief complaint of seizure. [facility] are asked to admit.  Information is obtained from the husband who is at the bedside and the patient noting that information from patient may be unreliable do to postictal phase. Husband reports he went to bed last night. She had been her usual state of health except for a cough for which she had completed antibiotics 3 days ago. He also reports over the last couple of days she complained of "feeling like she was given a pass out". These episodes were brief few and would subside with rest. During the night he "heard her fall". He reports she was initially unresponsive and he got her but to bed. He called 911 and he was not at all sure what was going on. Reportedly during the and support patient had a second seizure. No report of any incontinence. She did not hit her head. She did bite her tongue. Patient arouses easily and denies pain. She reports  doctor gave her "penicillin" and that she completed the course 3 days ago. She denies headache chest pain palpitation shortness of breath. She denies any nausea vomiting abdominal pain dysuria hematuria frequency or urgency.she denies diarrhea constipation melena bright red blood per rectum.denies drug use denies EtOH use   ED Course: in the emergency department patient's afebrile hypertensive mildly tachycardic not hypoxic. She had a third tonic-clonic type seizure while in the ED. She was provided with 1000 mg of IV. At the time of admission she is lethargic but easily aroused. She responds to questions and commands.  Review of Systems: As per HPI otherwise all other systems  reviewed and are negative.   Ambulatory Status:ambulates independently and is independent with ADLs  Past Medical History:  Diagnosis Date  . CHF (congestive heart failure) ([place])   . Diabetes mellitus   . Hypertension   . Nonischemic cardiomyopathy ([place])   . Obesity   . Seizure ([place])   . Sleep apnea     Past Surgical History:  Procedure Laterality Date  . CARDIAC CATHETERIZATION    . CORONARY STENT INTERVENTION N/A 12/10/2016   Procedure: Coronary Stent Intervention;  Surgeon: Wellington Hampshire, MD;  Location: [facility];  Service: Cardiovascular;  Laterality: N/A;  . LEFT HEART CATH AND CORONARY ANGIOGRAPHY N/A 12/10/2016   Procedure: Left Heart Cath and Coronary Angiography;  Surgeon: Wellington Hampshire, MD;  Location: [facility];  Service: Cardiovascular;  Laterality: N/A;    Social History   Socioeconomic History  . Marital status: Married    Spouse name: Not on file  . Number of children: 2  . Years of education: Not on file  . Highest education level: Not on file  Occupational History  . Occupation: Therapist, art: [employer]  . Financial resource strain: Not on file  . Food insecurity:    Worry: Not on file    Inability: Not on file  . Transportation needs:    Medical: Not on file    Non-medical: Not on file  Tobacco Use  . Smoking status: Former Research scientist (life sciences)  . Smokeless tobacco: Never Used  Substance and Sexual Activity  .  Alcohol use: Yes    Comment: occasional  . Drug use: No  . Sexual activity: Not on file  Lifestyle  . Physical activity:    Days per week: Not on file    Minutes per session: Not on file  . Stress: Not on file  Relationships  . Social connections:    Talks on phone: Not on file    Gets together: Not on file    Attends religious service: Not on file    Active member of club or organization: Not on file    Attends meetings of clubs or organizations: Not on file    Relationship status:  Not on file  . Intimate partner violence:    Fear of current or ex partner: Not on file    Emotionally abused: Not on file    Physically abused: Not on file    Forced sexual activity: Not on file  Other Topics Concern  . Not on file  Social History Narrative  . Not on file    No Known Allergies  Family History  Problem Relation Age of Onset  . Heart failure Mother        hx of - died of nonalcoholic cirrhosis  . Coronary artery disease Father 45       alive  . Other Brother        brain tumor    Prior to Admission medications   Medication Sig Start Date End Date Taking? Authorizing Provider  aspirin 81 MG tablet Take 1 tablet (81 mg total) by mouth daily. 12/11/16  Yes Rai, Ripudeep K, MD  carvedilol (COREG) 25 MG tablet Take 1.5 tablets (37.5 mg total) by mouth 2 (two) times daily. Patient taking differently: Take 12.5-25 mg by mouth See admin instructions. Take 1 tablet (25 mg totally) in the morning; take 0.5 tablet (12.5 mg totally) in the evening 12/18/16 05/03/26 Yes Nahser, Wonda Cheng, MD  digoxin (LANOXIN) 0.125 MG tablet Take 1 tablet (125 mcg total) by mouth daily. 12/11/16  Yes Rai, Ripudeep K, MD  furosemide (LASIX) 20 MG tablet Take 1 tablet (20 mg total) by mouth daily. Patient taking differently: Take 20 mg by mouth every other day.  02/11/18  Yes Carmin Muskrat, MD  gabapentin (NEURONTIN) 300 MG capsule Take 1 capsule (300 mg total) by mouth at bedtime. Patient taking differently: Take 300 mg by mouth daily as needed (for pain).  02/11/18  Yes Carmin Muskrat, MD  losartan (COZAAR) 100 MG tablet Take 1 tablet (100 mg total) by mouth daily. 02/11/18 10/23/23 Yes Carmin Muskrat, MD  metFORMIN (GLUCOPHAGE) 500 MG tablet Take 500 mg by mouth daily with breakfast.   Yes [provider]  nitroGLYCERIN (NITROSTAT) 0.4 MG SL tablet Place 1 tablet (0.4 mg total) under the tongue every 5 (five) minutes as needed for chest pain. 12/11/16  Yes Rai, Ripudeep K, MD  spironolactone  (ALDACTONE) 25 MG tablet Take 1 tablet (25 mg total) by mouth daily. Patient taking differently: Take 25 mg by mouth daily as needed (when taking Lasix).  12/11/16  Yes Rai, Ripudeep K, MD  ticagrelor (BRILINTA) 90 MG TABS tablet Take 1 tablet (90 mg total) by mouth 2 (two) times daily. 02/11/18  Yes Carmin Muskrat, MD    Physical Exam: Vitals:   05/03/18 0616 05/03/18 0630 05/03/18 0645 05/03/18 0700  BP: (!) 158/97 (!) 146/82 (!) 154/101 (!) 157/89  Pulse: 97 95 92 92  Resp: 16 19 (!) 21 19  Temp:  TempSrc:      SpO2: 95% 94% 93% 93%  Weight:      Height:         General:  Lying in bed with head of bed elevated. Eyes closed. She arouses to verbal stimuli. In no acute distress Eyes:  PERRL, EOMI, normal lids, iris ENT:  grossly normal hearing, lips, tongue with wound on right side, some dried blood around her lips. Neck:  no LAD, masses or thyromegaly Cardiovascular:  RRR, no m/r/g. No LE edema.  Respiratory:  CTA bilaterally, no w/r/r. Normal respiratory effort. Abdomen:  soft, ntnd, positive bowel sounds throughout Skin:  no rash or induration seen on limited exam Musculoskeletal:  grossly normal tone BUE/BLE, good ROM, no bony abnormality Psychiatric:  grossly normal mood and affect, speech fluent and appropriate, AOx3 Neurologic:  Lethargic but arouses to verbal stimuli. Responds appropriately to questions and commands. Moving all extremities spontaneously. Bilateral grip 5 out of 5. Lower extremity strength 5 out of 5  Labs on Admission: I have personally reviewed following labs and imaging studies  CBC: Recent Labs  Lab 05/02/18 2357 05/03/18 0002  WBC 13.7*  --   NEUTROABS 9.0*  --   HGB 13.5 13.6  HCT 41.2 40.0  MCV 86.7  --   PLT 512*  --    Basic Metabolic Panel: Recent Labs  Lab 05/02/18 2357 05/03/18 0002  NA 134* 133*  K 4.1 4.2  CL 97* 97*  CO2 25  --   GLUCOSE 355* 362*  BUN 19 23  CREATININE 1.03* 0.90  CALCIUM 9.2  --   MG 1.7  --     GFR: Estimated Creatinine Clearance: 59.9 mL/min (by C-G formula based on SCr of 0.9 mg/dL). Liver Function Tests: Recent Labs  Lab 05/02/18 2357  AST 19  ALT 16  ALKPHOS 116  BILITOT 0.4  PROT 7.0  ALBUMIN 3.0*   No results for input(s): LIPASE, AMYLASE in the last 168 hours. No results for input(s): AMMONIA in the last 168 hours. Coagulation Profile: No results for input(s): INR, PROTIME in the last 168 hours. Cardiac Enzymes: No results for input(s): CKTOTAL, CKMB, CKMBINDEX, TROPONINI in the last 168 hours. BNP (last 3 results) No results for input(s): PROBNP in the last 8760 hours. HbA1C: No results for input(s): HGBA1C in the last 72 hours. CBG: Recent Labs  Lab 05/03/18 0035 05/03/18 0658  GLUCAP 406* 369*   Lipid Profile: No results for input(s): CHOL, HDL, LDLCALC, TRIG, CHOLHDL, LDLDIRECT in the last 72 hours. Thyroid Function Tests: No results for input(s): TSH, T4TOTAL, FREET4, T3FREE, THYROIDAB in the last 72 hours. Anemia Panel: No results for input(s): VITAMINB12, FOLATE, FERRITIN, TIBC, IRON, RETICCTPCT in the last 72 hours. Urine analysis:    Component Value Date/Time   COLORURINE STRAW (A) 05/02/2018 2358   APPEARANCEUR CLEAR 05/02/2018 2358   LABSPEC 1.012 05/02/2018 2358   PHURINE 5.0 05/02/2018 2358   GLUCOSEU >=500 (A) 05/02/2018 2358   HGBUR NEGATIVE 05/02/2018 2358   BILIRUBINUR NEGATIVE 05/02/2018 2358   KETONESUR NEGATIVE 05/02/2018 2358   PROTEINUR 30 (A) 05/02/2018 2358   NITRITE NEGATIVE 05/02/2018 2358   LEUKOCYTESUR NEGATIVE 05/02/2018 2358    Creatinine Clearance: Estimated Creatinine Clearance: 59.9 mL/min (by C-G formula based on SCr of 0.9 mg/dL).  Sepsis Labs: @LABRCNTIP (procalcitonin:4,lacticidven:4) )No results found for this or any previous visit (from the past 240 hour(s)).   Radiological Exams on Admission: Ct Head Wo Contrast  Result Date: 05/03/2018 CLINICAL DATA:  New onset  witnessed seizures. Hypertensive.  History of diabetes. EXAM: CT HEAD WITHOUT CONTRAST CT CERVICAL SPINE WITHOUT CONTRAST TECHNIQUE: Multidetector CT imaging of the head and cervical spine was performed following the standard protocol without intravenous contrast. Multiplanar CT image reconstructions of the cervical spine were also generated. COMPARISON:  MRI of the head April 13, 2018 FINDINGS: CT HEAD FINDINGS BRAIN: No intraparenchymal hemorrhage, mass effect nor midline shift. The ventricles and sulci are normal. No acute large vascular territory infarcts. No abnormal extra-axial fluid collections. Basal cisterns are patent. 18 x 10 mm LEFT frontal extra-axial mass previously characterized as meningioma without significant mass effect. VASCULAR: Mild calcific atherosclerosis carotid siphons. SKULL/SOFT TISSUES: No skull fracture. Small LEFT parietal scalp hematoma without subcutaneous gas or radiopaque foreign bodies. ORBITS/SINUSES: The included ocular globes and orbital contents are normal.Mild paranasal sinus mucosal thickening. Bilateral mastoid effusions. No air cell coalescence. OTHER: None. CT CERVICAL SPINE FINDINGS-moderately motion degraded examination. ALIGNMENT: Straightened cervical lordosis. Grade 1 C3-4 anterolisthesis. SKULL BASE AND VERTEBRAE: Vertebral bodies intact. Severe C5-6 disc height loss with proportional endplate spurring compatible with degenerative discs, moderate to severe at C6-7 and moderate at C7-T1. C1-2 articulation maintained with moderate arthropathy. Multilevel moderate facet arthropathy. No destructive bony lesions. SOFT TISSUES AND SPINAL CANAL: Nonacute. Mild calcific atherosclerosis carotid bifurcations. DISC LEVELS: Severe C3-4 neural foraminal narrowing. Moderate to severe LEFT C5-6 and LEFT C6-7 neural foraminal narrowing. Mild canal stenosis C5-6 and C6-7. UPPER CHEST: Lung apices are clear. OTHER: None. IMPRESSION: CT HEAD: 1. No acute intracranial process.  Small LEFT scalp hematoma. 2.  Redemonstration of 18 x 10 mm LEFT frontal meningioma without mass effect. 3. Otherwise negative non-contrast CT HEAD for age. CT CERVICAL SPINE: 1. Moderately motion degraded examination. 2. No fracture.  Grade 1 C3-4 anterolisthesis. 3. Severe C3-4 neural foraminal narrowing. Electronically Signed   By: Elon Alas M.D.   On: 05/03/2018 01:00   Ct Cervical Spine Wo Contrast  Result Date: 05/03/2018 CLINICAL DATA:  New onset witnessed seizures. Hypertensive. History of diabetes. EXAM: CT HEAD WITHOUT CONTRAST CT CERVICAL SPINE WITHOUT CONTRAST TECHNIQUE: Multidetector CT imaging of the head and cervical spine was performed following the standard protocol without intravenous contrast. Multiplanar CT image reconstructions of the cervical spine were also generated. COMPARISON:  MRI of the head April 13, 2018 FINDINGS: CT HEAD FINDINGS BRAIN: No intraparenchymal hemorrhage, mass effect nor midline shift. The ventricles and sulci are normal. No acute large vascular territory infarcts. No abnormal extra-axial fluid collections. Basal cisterns are patent. 18 x 10 mm LEFT frontal extra-axial mass previously characterized as meningioma without significant mass effect. VASCULAR: Mild calcific atherosclerosis carotid siphons. SKULL/SOFT TISSUES: No skull fracture. Small LEFT parietal scalp hematoma without subcutaneous gas or radiopaque foreign bodies. ORBITS/SINUSES: The included ocular globes and orbital contents are normal.Mild paranasal sinus mucosal thickening. Bilateral mastoid effusions. No air cell coalescence. OTHER: None. CT CERVICAL SPINE FINDINGS-moderately motion degraded examination. ALIGNMENT: Straightened cervical lordosis. Grade 1 C3-4 anterolisthesis. SKULL BASE AND VERTEBRAE: Vertebral bodies intact. Severe C5-6 disc height loss with proportional endplate spurring compatible with degenerative discs, moderate to severe at C6-7 and moderate at C7-T1. C1-2 articulation maintained with moderate  arthropathy. Multilevel moderate facet arthropathy. No destructive bony lesions. SOFT TISSUES AND SPINAL CANAL: Nonacute. Mild calcific atherosclerosis carotid bifurcations. DISC LEVELS: Severe C3-4 neural foraminal narrowing. Moderate to severe LEFT C5-6 and LEFT C6-7 neural foraminal narrowing. Mild canal stenosis C5-6 and C6-7. UPPER CHEST: Lung apices are clear. OTHER: None. IMPRESSION: CT HEAD: 1. No acute intracranial  process.  Small LEFT scalp hematoma. 2. Redemonstration of 18 x 10 mm LEFT frontal meningioma without mass effect. 3. Otherwise negative non-contrast CT HEAD for age. CT CERVICAL SPINE: 1. Moderately motion degraded examination. 2. No fracture.  Grade 1 C3-4 anterolisthesis. 3. Severe C3-4 neural foraminal narrowing. Electronically Signed   By: Elon Alas M.D.   On: 05/03/2018 01:00   Mr Brain Wo Contrast  Result Date: 05/03/2018 CLINICAL DATA:  New onset seizures, altered mental status. History of hypertension, diabetes. EXAM: MRI HEAD WITHOUT CONTRAST TECHNIQUE: Multiplanar, multiecho pulse sequences of the brain and surrounding structures were obtained without intravenous contrast. COMPARISON:  CT HEAD May 03, 2018 and MRI of the head Feb 11, 2018. FINDINGS: Moderately motion degraded examination. INTRACRANIAL CONTENTS: Slightly greater than expected reduced diffusion bilateral hippocampi with FLAIR T2 hyperintense signal though inconsistent between sequences. No ADC abnormality. No midline shift or mass effect. No parenchymal brain volume loss for age. No hydrocephalus. No abnormal extra-axial fluid collections. Similar appearance of 18 x 10 mm low signal LEFT frontal dural based mass consistent with meningioma. VASCULAR: Normal major intracranial vascular flow voids present at skull base. SKULL AND UPPER CERVICAL SPINE: No abnormal sellar expansion. No suspicious calvarial bone marrow signal. Craniocervical junction maintained. Small LEFT frontal scalp hematoma. SINUSES/ORBITS:  Mild paranasal sinus mucosal thickening. Bilateral mastoid effusions.The included ocular globes and orbital contents are non-suspicious. OTHER: None. IMPRESSION: 1. Moderately motion degraded examination. 2. Signal abnormality bilateral hippocampi favored as artifact. Though, given new onset seizures this may be postictal; infectious (HSV) or inflammatory encephalitis should be excluded. Recommend correlation with CSF studies. Repeat MRI with contrast could be considered, with sedation. 3. Redemonstration of 18 x 10 mm LEFT frontal meningioma without mass effect. 4. Acute findings discussed with and reconfirmed by PA Joaquin Courts on 05/03/2018 at 5:08 am. Electronically Signed   By: Elon Alas M.D.   On: 05/03/2018 05:11    EKG: Independently reviewed. Sinus tachycardia Probable left atrial enlargement Borderline repolarization abnormality Prolonged QT interval When compared with ECG of 02/11/2018, Nonspecific ST abnormality is now present QT has lengthened  Assessment/Plan Principal Problem:   Seizure ([place]) Active Problems:   Essential hypertension   Chronic systolic heart failure ([facility])   DM2 (diabetes mellitus, type 2) ([facility])   Hypomagnesemia   Hyponatremia   #1. Seizure. New-onset. Patient had 2 witnessed tonic-clonic seizure activities. She received Ativan and loading dose of Keppra. Etiology unclear. No metabolic derangement. No medication changes. UDS negative except for benzos received in ED. She did have a respiratory infection 2 weeks ago and completed a course of penicillin. CT of the head shows small meningioma otherwise no acute changes. Initially plan was for patient to be discharged and have outpatient EEG. However she had another seizure. MRI reveals signal abnormality bilateral hippocampal he favors artifact but given new onset seizures concern for infectious or inflammatory encephalitis. Dr. Leonel Ramsay with neurology consulted and opined findings not likely representative of  infectious encephalitis given the clinical course. Keppra 500 mg twice a day recommended -Admit to stepdown for observation -Keppra 500 mg IV twice a day -EEG -gentle IV fluids -Seizure precautions -prn ativan -Outpatient follow-up with neurology  #2. Hypertension. Patient is hypertensive in the emergency department. Has a history of hypertensive urgency. Home medications include Coreg, Lasix, Cozaar, spironolactone -We will resume Coreg and digoxin for now. -Hold Lasix until tomorrow -Hold spironolactone Cozaar -resume home meds as indicated  #3. Unspecified cardiomyopathy. Appears compensated. Home medications include Coreg, digoxin, Lasix,  losartan, spironolactone, Brilinta. Echo done in March 2018 reveals an EF 65% and mild LVH. EKG as noted above -Hold Lasix until tomorrow -Holding losartan and spironolactone for now -continue digoxin and Coreg -Daily weights -Intake and output -resume home meds as indicated  #4. Diabetes. Home medications include metformin. Serum glucose 365 on admission. -Hold metformin -Obtain a hemoglobin A1c -Sliding scale insulin for optimal control  #5. Hypomagnesemia. Magnesium level I.7 -Replete  #6. Hyponatremia. Mild. She is provided with 1 L normal saline in the emergency department -Continue gentle IV fluids -recheck in am    DVT prophylaxis: lovenox  Code Status: full  Family Communication: husband at bedside  Disposition Plan: home hopefully 24 hours  Consults called: dr Leonel Ramsay neuro Admission status: obs    Radene Gunning MD [facility]  If 7PM-7AM, please contact night-coverage [email or web address] Password [facility]  05/03/2018, 7:16 AM

## 2018-05-03 NOTE — Care Management (Signed)
This is a no charge note  Pending admission per PA, Kelly  Hypertension, hyperlipidemia, diabetes mellitus, CHF, OSA, obesity, who presents with new onset of seizure. MRI showed redemonstration of 18 x 10 mm LEFT frontal meningioma without mass effect. Loaded with 1 g of keppra. Dr. Leonel Ramsay for neurology was consulted.  Patient is placed on stepdown for observation.   Ivor Costa, MD  [facility] Pager [phone]  If 7PM-7AM, please contact night-coverage [email or web address] Password [facility] 05/03/2018, 5:44 AM

## 2018-05-03 NOTE — Discharge Instructions (Addendum)
Department of Motor Vehicle (DMV) of [place] regulations for seizures - It is the patient's responsibility to report the incidence of the seizure in the state of [place]. [place] has no statutory provision requiring physicians to report patients diagnosed with epilepsy or seizures to a central state agency.  The recommended DMV regulation requirement for a driver in [place] for an individual with a seizure is that they be seizure-free for 6-12 months. However, the DMV may consider the following exceptions to this general rule where: (1) a physician-directed change in medication causes a seizure and the individual immediately resumes the previous therapy which controlled seizures; (2) there is a history of nocturnal seizures or seizures which do not involve loss of consciousness, loss of control of motor function, or loss of appropriate sensation and information process; and (3) an individual has a seizure disorder preceded by an aura (warning) lasting 2-3 minutes. While the DMV may also give consideration to other unusual circumstances which may affect the general requirement that drivers be seizure-free for 6-12 months, interpretation of these circumstances and assignment of restrictions is at the discretion of the Medical Advisor. The DMV also considers compliance with medical therapy essential for safe driving. [The [place] Physician's Guide to Driver Medical Evaluation (June, 1995 ed.)] The Department learns of an individual's condition by inquiring on the application form or renewal form, a physician's report to the DMV, an accident report or from correspondence from the individual. The person may be required to submit a Medical Report Form either annually or semi-annually.  Do not drive, swim, take baths or do any other activities that would be dangerous if you had another seizure.   Contact a health care provider if: You have another seizure. You have seizures more often. Your seizure symptoms  change. You continue to have seizures with treatment. You have symptoms of an infection or illness. They might increase your risk of having a seizure. Get help right away if: You have a seizure: That lasts longer than 5 minutes. That is different than previous seizures. That leaves you unable to speak or use a part of your body. That makes it harder to breathe. After a head injury. You have: Multiple seizures in a row. Confusion or a severe headache right after a seizure. You are having seizures more often. You do not wake up immediately after a seizure. You injure yourself during a seizure.  

## 2018-05-03 NOTE — ED Notes (Signed)
Pt returned to room from IR procedure. Pt may only sit up in the bed for short periods of time for meals. Pt must stay supine with the exception for meals until 1540 today.

## 2018-05-03 NOTE — Progress Notes (Signed)
Pharmacy Antibiotic Note  Sally Hendrix is a 62 y.o. female admitted on 05/02/2018 with possible HSV encephalitis.  Pharmacy has been consulted for ayclovir dosing. Pt is afebrile and WBC is elevated at 13.7. SCr is WNL. LP is pending.   Plan: Acyclovir 10mg /kg IV Q8H F/u renal fxn, C&S, clinical status  Height: 5\' 6"  (167.6 cm) Weight: 150 lb (68 kg) IBW/kg (Calculated) : 59.3  Temp (24hrs), Avg:97.7 F (36.5 C), Min:97.7 F (36.5 C), Max:97.7 F (36.5 C)  Recent Labs  Lab 05/02/18 2357 05/03/18 0002  WBC 13.7*  --   CREATININE 1.03* 0.90    Estimated Creatinine Clearance: 59.9 mL/min (by C-G formula based on SCr of 0.9 mg/dL).    No Known Allergies  Antimicrobials this admission: Acyclovir 7/29>>  Dose adjustments this admission: N/A  Microbiology results: Pending  Thank you for allowing pharmacy to be a part of this patient's care.  Neveyah Garzon, Rande Lawman 05/03/2018 12:44 PM

## 2018-05-03 NOTE — ED Notes (Signed)
MD in to attempt LP

## 2018-05-03 NOTE — Procedures (Signed)
Indication: encephalitis  Risks of the procedure were dicussed with the patient including post-LP headache, bleeding, infection, weakness/numbness of legs(radiculopathy), death.  The patient a ndpatient's proxy agreed and written consent was obtained from husband  The patient was prepped and draped, and using sterile technique a 20 gauge quinke spinal needle was inserted in the L3-4 and L4-5 space. 5 attempts were mad and in final attempt was able to get into space but no fluid gotten.   Will need to be done under fluoroscopeDavid Derek Mound [facility] [phone]  M-F  (9:00 am- 5:00 PM)  05/03/2018, 10:32 AM .

## 2018-05-03 NOTE — ED Notes (Signed)
LP attempts per MD unsuccessful - plan is to have LP done in IR - order placed

## 2018-05-03 NOTE — ED Notes (Signed)
Pt must stay supine until 1430 d/t lumbar puncture. Pt/family aware and both verbalize understanding.

## 2018-05-03 NOTE — ED Notes (Signed)
Report given to Ginger, RN 5W - ready to accept pt

## 2018-05-03 NOTE — Progress Notes (Addendum)
Patient trasfered from ED to 5W10 via stretcher; alert and oriented x 4; no complaints of pain; IV in LAC running fluids at 75 cc/hr. Orient patient to room and unit; gave patient care guide; instructed how to use the call bell and  fall risk precautions. Will continue to monitor the patient.

## 2018-05-03 NOTE — ED Provider Notes (Addendum)
[facility] EMERGENCY DEPARTMENT Provider Note   CSN: [id number] Arrival date & time: 05/02/18  2345     History   Chief Complaint Chief Complaint  Patient presents with  . Seizures    HPI Sally Hendrix is a 62 y.o. female brought in by EMS for new onset seizure.Marland Kitchen History is given by the husband who is at bedside and EMS.  There is a level 5 caveat due to altered mental status.  Patient states that they were up doing paperwork and were getting ready to go to bed.  It was about 15 minutes when he was lying in bed when he suddenly heard a loud thud.  He got up to see what was going on and noticed that his wife was tightened up and shaking all over.  She was unresponsive.  EMS reports that when they arrived she was combative and computed confused and bleeding from her tongue consistent with bite injury.  Patient was able to speak in complete sentences but was giving confused responses.  They were able to get her into the ambulance when she suddenly tightened up, brought her arms up overhead, turned her head to the right side and then began tonic-clonic seizure-like activity lasting for about 3 minutes.  They gave her midazolam in the truck and the patient seizure broke.  Her husband states that she has no previous history of seizures.  She does not take any new medications that he is aware of.  He denies a history of alcohol abuse.  He states that she has had a bad cold for the past 3 weeks and has been sleeping much more than normal.  HPI  Past Medical History:  Diagnosis Date  . Cervical cancer ([place]) 1980s  . CHF (congestive heart failure) ([place])   . Depression    "all my life" (05/03/2018)  . History of kidney stones   . Hypertension   . Myocardial infarction ([place]) 12/10/2016  . Nonischemic cardiomyopathy ([place])   . Obesity   . Seizure ([place]) 05/02/2018 X2-3  . Sleep apnea    "not dx'd" (05/03/2018)  . Type II diabetes mellitus [facility])     Patient Active Problem List     Diagnosis Date Noted  . Seizures ([place]) 05/04/2018  . Seizure ([place]) 05/03/2018  . Hypomagnesemia 05/03/2018  . Hyponatremia 05/03/2018  . Non-ST elevation (NSTEMI) myocardial infarction ([place])   . Chest pain 12/09/2016  . Uncontrolled type 2 diabetes mellitus with hyperglycemia ([place]) 12/09/2016  . Hypertensive urgency 12/09/2016  . Chronic systolic heart failure ([place]) 03/31/2011  . Essential hypertension 03/31/2011  . DM2 (diabetes mellitus, type 2) ([place]) 03/31/2011    Past Surgical History:  Procedure Laterality Date  . CARDIAC CATHETERIZATION    . CERVIX LESION DESTRUCTION  1980s   "for cancer"  . CORONARY STENT INTERVENTION N/A 12/10/2016   Procedure: Coronary Stent Intervention;  Surgeon: Wellington Hampshire, MD;  Location: [place] CV LAB;  Service: Cardiovascular;  Laterality: N/A;  . LEFT HEART CATH AND CORONARY ANGIOGRAPHY N/A 12/10/2016   Procedure: Left Heart Cath and Coronary Angiography;  Surgeon: Wellington Hampshire, MD;  Location: [place] CV LAB;  Service: Cardiovascular;  Laterality: N/A;     OB History   None      Home Medications    Prior to Admission medications   Medication Sig Start Date End Date Taking? Authorizing Provider  aspirin 81 MG tablet Take 1 tablet (81 mg total) by mouth daily. 12/11/16  Yes  Rai, Ripudeep K, MD  carvedilol (COREG) 25 MG tablet Take 1.5 tablets (37.5 mg total) by mouth 2 (two) times daily. Patient taking differently: Take 12.5-25 mg by mouth See admin instructions. Take 1 tablet (25 mg totally) in the morning; take 0.5 tablet (12.5 mg totally) in the evening 12/18/16 05/03/26 Yes Nahser, Wonda Cheng, MD  digoxin (LANOXIN) 0.125 MG tablet Take 1 tablet (125 mcg total) by mouth daily. 12/11/16  Yes Rai, Ripudeep K, MD  furosemide (LASIX) 20 MG tablet Take 1 tablet (20 mg total) by mouth daily. Patient taking differently: Take 20 mg by mouth every other day.  02/11/18  Yes Carmin Muskrat, MD  losartan (COZAAR) 100 MG tablet Take 1 tablet (100  mg total) by mouth daily. 02/11/18 10/23/23 Yes Carmin Muskrat, MD  nitroGLYCERIN (NITROSTAT) 0.4 MG SL tablet Place 1 tablet (0.4 mg total) under the tongue every 5 (five) minutes as needed for chest pain. 12/11/16  Yes Rai, Ripudeep K, MD  spironolactone (ALDACTONE) 25 MG tablet Take 1 tablet (25 mg total) by mouth daily. Patient taking differently: Take 25 mg by mouth daily as needed (when taking Lasix).  12/11/16  Yes Rai, Ripudeep K, MD  ticagrelor (BRILINTA) 90 MG TABS tablet Take 1 tablet (90 mg total) by mouth 2 (two) times daily. 02/11/18  Yes Carmin Muskrat, MD  Insulin Isophane & Regular Human (NOVOLIN 70/30 FLEXPEN RELION) (70-30) 100 UNIT/ML PEN Inject 15 Units into the skin 2 (two) times daily before a meal. 05/05/18   Donne Hazel, MD  Insulin Pen Needle 32G X 4 MM MISC 1 Device by Does not apply route 2 (two) times daily. 05/05/18   Donne Hazel, MD  levETIRAcetam (KEPPRA) 500 MG tablet Take 1 tablet (500 mg total) by mouth 2 (two) times daily. 05/05/18 06/04/18  Donne Hazel, MD    Family History Family History  Problem Relation Age of Onset  . Heart failure Mother        hx of - died of nonalcoholic cirrhosis  . Coronary artery disease Father 19       alive  . Other Brother        brain tumor    Social History Social History   Tobacco Use  . Smoking status: Former Smoker    Packs/day: 0.00    Years: 1.00    Pack years: 0.00    Types: Cigarettes    Last attempt to quit: 1980    Years since quitting: 39.6  . Smokeless tobacco: Never Used  . Tobacco comment: "smoked alot for 1 yr"  Substance Use Topics  . Alcohol use: Yes    Comment: 05/03/2018 "nothing in the last 10 yrs; take too much RX"  . Drug use: No     Allergies   Patient has no known allergies.   Review of Systems Review of Systems  Unable to perform ROS: Mental status change     Physical Exam Updated Vital Signs BP (!) 168/82 (BP Location: Right Arm)   Pulse 82   Temp 98.7 F (37.1 C)  (Oral)   Resp (!) 28   Ht '5\' 6"'$  (1.676 m)   Wt 63.5 kg   SpO2 97%   BMI 22.58 kg/m   Physical Exam  Constitutional: She appears well-developed and well-nourished. No distress.  HENT:  Head: Normocephalic.  Bite wound to the right side of the tongue  Eyes: Pupils are equal, round, and reactive to light. Conjunctivae are normal. No scleral icterus.  Pupils  are equal and responsive to light but sluggish  Neck: Normal range of motion. Neck supple.  Cardiovascular: Regular rhythm and normal heart sounds. Exam reveals no gallop and no friction rub.  No murmur heard. Tachycardic  Pulmonary/Chest: Effort normal and breath sounds normal. No respiratory distress.  Abdominal: Soft. Bowel sounds are normal. She exhibits no distension and no mass. There is no tenderness. There is no guarding.  Musculoskeletal: She exhibits no edema, tenderness or deformity.  Neurological: She is disoriented. She displays seizure activity. GCS eye subscore is 3. GCS verbal subscore is 1. GCS motor subscore is 4.  Skin: Skin is warm and dry. She is not diaphoretic.  Psychiatric: Her behavior is normal.  Nursing note and vitals reviewed.    ED Treatments / Results  Labs (all labs ordered are listed, but only abnormal results are displayed) Labs Reviewed  CBC WITH DIFFERENTIAL/PLATELET - Abnormal; Notable for the following components:      Result Value   WBC 13.7 (*)    Platelets 512 (*)    Neutro Abs 9.0 (*)    All other components within normal limits  COMPREHENSIVE METABOLIC PANEL - Abnormal; Notable for the following components:   Sodium 134 (*)    Chloride 97 (*)    Glucose, Bld 355 (*)    Creatinine, Ser 1.03 (*)    Albumin 3.0 (*)    GFR calc non Af Amer 57 (*)    All other components within normal limits  RAPID URINE DRUG SCREEN, HOSP PERFORMED - Abnormal; Notable for the following components:   Benzodiazepines POSITIVE (*)    All other components within normal limits  URINALYSIS, ROUTINE W  REFLEX MICROSCOPIC - Abnormal; Notable for the following components:   Color, Urine STRAW (*)    Glucose, UA >=500 (*)    Protein, ur 30 (*)    All other components within normal limits  DIGOXIN LEVEL - Abnormal; Notable for the following components:   Digoxin Level <0.2 (*)    All other components within normal limits  GLUCOSE, CSF - Abnormal; Notable for the following components:   Glucose, CSF 189 (*)    All other components within normal limits  PROTEIN, CSF - Abnormal; Notable for the following components:   Total  Protein, CSF 88 (*)    All other components within normal limits  CSF CELL COUNT WITH DIFFERENTIAL - Abnormal; Notable for the following components:   RBC Count, CSF 101 (*)    All other components within normal limits  HSV(HERPES SMPLX VRS)ABS-I+II(IGG)-CSF - Abnormal; Notable for the following components:   HSV Type I/II Ab, IgG CSF 3.18 (*)    All other components within normal limits  BASIC METABOLIC PANEL - Abnormal; Notable for the following components:   Glucose, Bld 246 (*)    Creatinine, Ser 1.02 (*)    Calcium 8.3 (*)    GFR calc non Af Amer 57 (*)    All other components within normal limits  CBC - Abnormal; Notable for the following components:   Platelets 416 (*)    All other components within normal limits  GLUCOSE, CAPILLARY - Abnormal; Notable for the following components:   Glucose-Capillary 246 (*)    All other components within normal limits  GLUCOSE, CAPILLARY - Abnormal; Notable for the following components:   Glucose-Capillary 246 (*)    All other components within normal limits  GLUCOSE, CAPILLARY - Abnormal; Notable for the following components:   Glucose-Capillary 296 (*)    All  other components within normal limits  IGG CSF INDEX - Abnormal; Notable for the following components:   Albumin CSF-mCnc 50 (*)    Albumin 2.8 (*)    All other components within normal limits  GLUCOSE, CAPILLARY - Abnormal; Notable for the following  components:   Glucose-Capillary 280 (*)    All other components within normal limits  GLUCOSE, CAPILLARY - Abnormal; Notable for the following components:   Glucose-Capillary 436 (*)    All other components within normal limits  CBC - Abnormal; Notable for the following components:   Hemoglobin 11.8 (*)    Platelets 404 (*)    All other components within normal limits  BASIC METABOLIC PANEL - Abnormal; Notable for the following components:   Glucose, Bld 246 (*)    Creatinine, Ser 1.10 (*)    Calcium 8.6 (*)    GFR calc non Af Amer 52 (*)    All other components within normal limits  GLUCOSE, CAPILLARY - Abnormal; Notable for the following components:   Glucose-Capillary 406 (*)    All other components within normal limits  GLUCOSE, CAPILLARY - Abnormal; Notable for the following components:   Glucose-Capillary 313 (*)    All other components within normal limits  GLUCOSE, CAPILLARY - Abnormal; Notable for the following components:   Glucose-Capillary 173 (*)    All other components within normal limits  HEMOGLOBIN A1C - Abnormal; Notable for the following components:   Hgb A1c MFr Bld 12.9 (*)    All other components within normal limits  LIPID PANEL - Abnormal; Notable for the following components:   Cholesterol 224 (*)    Triglycerides 337 (*)    HDL 31 (*)    VLDL 67 (*)    LDL Cholesterol 126 (*)    All other components within normal limits  GLUCOSE, CAPILLARY - Abnormal; Notable for the following components:   Glucose-Capillary 306 (*)    All other components within normal limits  GLUCOSE, CAPILLARY - Abnormal; Notable for the following components:   Glucose-Capillary 265 (*)    All other components within normal limits  GLUCOSE, CAPILLARY - Abnormal; Notable for the following components:   Glucose-Capillary 229 (*)    All other components within normal limits  CBG MONITORING, ED - Abnormal; Notable for the following components:   Glucose-Capillary 406 (*)    All  other components within normal limits  I-STAT CHEM 8, ED - Abnormal; Notable for the following components:   Sodium 133 (*)    Chloride 97 (*)    Glucose, Bld 362 (*)    All other components within normal limits  CBG MONITORING, ED - Abnormal; Notable for the following components:   Glucose-Capillary 369 (*)    All other components within normal limits  CBG MONITORING, ED - Abnormal; Notable for the following components:   Glucose-Capillary 282 (*)    All other components within normal limits  MRSA PCR SCREENING  MAGNESIUM  ETHANOL  HIV ANTIBODY (ROUTINE TESTING)  VITAMIN B1  HERPES SIMPLEX VIRUS(HSV) DNA BY PCR  MAGNESIUM    EKG EKG Interpretation  Date/Time:  Sunday May 02 2018 23:51:28 EDT Ventricular Rate:  110 PR Interval:    QRS Duration: 96 QT Interval:  405 QTC Calculation: 548 R Axis:   38 Text Interpretation:  Sinus tachycardia Probable left atrial enlargement Borderline repolarization abnormality Prolonged QT interval When compared with ECG of 02/11/2018, Nonspecific ST abnormality is now present QT has lengthened Confirmed by Delora Fuel ([id number]) on 05/02/2018  11:59:59 PM   Radiology No results found.  Procedures .Critical Care Performed by: Margarita Mail, PA-C Authorized by: Margarita Mail, PA-C   Critical care provider statement:    Critical care time (minutes):  50   Critical care was necessary to treat or prevent imminent or life-threatening deterioration of the following conditions:  CNS failure or compromise   Critical care was time spent personally by me on the following activities:  Development of treatment plan with patient or surrogate, discussions with consultants, evaluation of patient's response to treatment, examination of patient, obtaining history from patient or surrogate, ordering and performing treatments and interventions, ordering and review of laboratory studies, ordering and review of radiographic studies, pulse oximetry, re-evaluation  of patient's condition and review of old charts   (including critical care time)  Medications Ordered in ED Medications  sodium chloride 0.9 % bolus 1,000 mL (0 mLs Intravenous Stopped 05/03/18 0323)  morphine 4 MG/ML injection 4 mg (4 mg Intravenous Given 05/03/18 0415)  levETIRAcetam (KEPPRA) IVPB 1000 mg/100 mL premix (0 mg Intravenous Stopped 05/03/18 0558)  magnesium sulfate IVPB 2 g 50 mL (0 g Intravenous Stopped 05/03/18 0925)  lidocaine (PF) (XYLOCAINE) 1 % injection (  Given by Other 05/03/18 1040)  lidocaine (PF) (XYLOCAINE) 1 % injection (  Given by Other 05/03/18 1040)  lidocaine (PF) (XYLOCAINE) 1 % injection 5 mL (5 mLs Intradermal Given 05/03/18 1153)  gabapentin (NEURONTIN) capsule 300 mg (300 mg Oral Given 05/03/18 2221)  insulin aspart (novoLOG) injection 9 Units (9 Units Subcutaneous Given 05/04/18 1729)  living well with diabetes book MISC ( Does not apply Given 05/05/18 1220)  insulin starter kit- syringes (English) 1 kit (1 kit Other Given 05/05/18 1812)  gadobenate dimeglumine (MULTIHANCE) injection 14 mL (14 mLs Intravenous Contrast Given 05/05/18 1721)     Initial Impression / Assessment and Plan / ED Course  I have reviewed the triage vital signs and the nursing notes.  Pertinent labs & imaging results that were available during my care of the patient were reviewed by me and considered in my medical decision making (see chart for details).  Clinical Course as of May 27 1211  Mon May 03, 2018  0121 I spoke with Dr. Leonel Ramsay about the patient's new onset seizure.  Given her hypertension history of hypertensive urgency will obtain an MRI to rule out press.  If negative and patient has returned to baseline she may be discharged to follow-up with outpatient neurology.   [AH]    Clinical Course User Index [AH] Margarita Mail, PA-C    I have given sign out to [facility].  I personally reviewed the patient's lab values.  She is hyperglycemic.  Her CT head and C-spine scan  is negative for acute abnormality only reviewed these films and agree with radiology interpretation I also reviewed the patient's EKG which shows QT prolongation.  Prolonged QT hypokalemia, hypomagnesemia, hypocalcemia, elevated ICP, sodium channel blockade, hypothermia, congenital long QT syndrome, acute ischemia The emergent ddx for new onset seizure includes but is not limited to idiopathic, trauma, Intracranial hemorrhage, mass, vascular lesion, Infection, metabolic disturbance, toxins/drugs, eclampsia, hypertensive encephalopathy, anoxic-ischemic injury.    Final Clinical Impressions(s) / ED Diagnoses   Final diagnoses:  Seizure ([place])  Prolonged Q-T interval on ECG    ED Discharge Orders         Ordered    Insulin Isophane & Regular Human (NOVOLIN 70/30 FLEXPEN RELION) (70-30) 100 UNIT/ML PEN  2 times daily before meals  05/05/18 1854    levETIRAcetam (KEPPRA) 500 MG tablet  2 times daily     05/05/18 1854    Ambulatory referral to Neurology    Comments:  An appointment is requested in approximately: 2 weeks for new onset seizures   05/05/18 1854    Insulin Pen Needle 32G X 4 MM MISC  2 times daily     05/05/18 1855           Margarita Mail, PA-C 11/28/34 1224    Delora Fuel, MD 49/75/30 [place], [place], PA-C 02/13/01 1117    Delora Fuel, MD 35/67/01 2232

## 2018-05-03 NOTE — ED Provider Notes (Signed)
5:25 AM Patient care assumed at shift change from Margarita Mail, PA-C pending MRI.  Patient presenting to the emergency department for new onset seizure-like activity.  She had additional seizure activity 20 minutes after initial episode; given Versed by EMS.  I discussed MRI findings with Dr. Leonel Ramsay of Neurology as there was signal modality bilaterally in the hippocampi.  This was favored to represent artifact, but was also thought to potentially represent postictal changes from new onset seizures.  Dr. Leonel Ramsay does not believe the findings represent infectious encephalitis given the clinical course.  He did recommend the patient be started on 500 mg Keppra twice daily.  While ordering Keppra loading dose in the ED, patient experienced another tonic-clonic type seizure.  This lasted for approximately 1 minute before spontaneously resolving.  By the time I reached the patient's room, seizure activity had subsided.  She did have recurrence of tongue biting with blood trickling from her mouth.  Patient with fixed gaze to the right and upward.  She was postictal for several minutes with snoring respirations.  Continues to be tired, but more easily aroused.  Given persistence of seizure frequency, I believe the patient would benefit from observation admission.  May also be able to complete EEG in the morning.  Will consult [facility] for admission.  5:46 AM Case discussed with Dr. Blaine Hamper. TRH to admit.   Antonietta Breach, PA-C 81/44/81 [id number]    Delora Fuel, MD 14/97/02 [phone]

## 2018-05-03 NOTE — ED Notes (Signed)
Pt. To CT via stretcher. 

## 2018-05-04 DIAGNOSIS — R569 Unspecified convulsions: Secondary | ICD-10-CM

## 2018-05-04 LAB — BASIC METABOLIC PANEL
Anion gap: 6 (ref 5–15)
BUN: 12 mg/dL (ref 8–23)
CHLORIDE: 104 mmol/L (ref 98–111)
CO2: 27 mmol/L (ref 22–32)
CREATININE: 1.02 mg/dL — AB (ref 0.44–1.00)
Calcium: 8.3 mg/dL — ABNORMAL LOW (ref 8.9–10.3)
GFR calc non Af Amer: 57 mL/min — ABNORMAL LOW (ref >60)
Glucose, Bld: 246 mg/dL — ABNORMAL HIGH (ref 70–99)
POTASSIUM: 4 mmol/L (ref 3.5–5.1)
SODIUM: 137 mmol/L (ref 135–145)

## 2018-05-04 LAB — CBC
HEMATOCRIT: 37.6 % (ref 36.0–46.0)
HEMOGLOBIN: 12.1 g/dL (ref 12.0–15.0)
MCH: 28.2 pg (ref 26.0–34.0)
MCHC: 32.2 g/dL (ref 30.0–36.0)
MCV: 87.6 fL (ref 78.0–100.0)
PLATELETS: 416 10*3/uL — AB (ref 150–400)
RBC: 4.29 MIL/uL (ref 3.87–5.11)
RDW: 13.5 % (ref 11.5–15.5)
WBC: 9.6 10*3/uL (ref 4.0–10.5)

## 2018-05-04 LAB — HERPES SIMPLEX VIRUS(HSV) DNA BY PCR
HSV 1 DNA: NEGATIVE
HSV 2 DNA: NEGATIVE

## 2018-05-04 LAB — GLUCOSE, CAPILLARY
GLUCOSE-CAPILLARY: 313 mg/dL — AB (ref 70–99)
GLUCOSE-CAPILLARY: 173 mg/dL — AB (ref 70–99)
Glucose-Capillary: 296 mg/dL — ABNORMAL HIGH (ref 70–99)
Glucose-Capillary: 280 mg/dL — ABNORMAL HIGH (ref 70–99)
Glucose-Capillary: 436 mg/dL — ABNORMAL HIGH (ref 70–99)
Glucose-Capillary: 406 mg/dL — ABNORMAL HIGH (ref 70–99)

## 2018-05-04 LAB — VITAMIN B1: Vitamin B1 (Thiamine): 125 nmol/L (ref 66.5–200.0)

## 2018-05-04 MED ORDER — INSULIN ASPART 100 UNIT/ML ~~LOC~~ SOLN
4.0000 [IU] | Freq: Three times a day (TID) | SUBCUTANEOUS | Status: DC
  Administered 2018-05-04 – 2018-05-05 (×4): 4 [IU] via SUBCUTANEOUS

## 2018-05-04 MED ORDER — INSULIN ASPART 100 UNIT/ML ~~LOC~~ SOLN
9.0000 [IU] | Freq: Once | SUBCUTANEOUS | Status: AC
  Administered 2018-05-04: 9 [IU] via SUBCUTANEOUS

## 2018-05-04 MED ORDER — INSULIN GLARGINE 100 UNIT/ML ~~LOC~~ SOLN
10.0000 [IU] | Freq: Every day | SUBCUTANEOUS | Status: DC
  Administered 2018-05-04: 10 [IU] via SUBCUTANEOUS
  Filled 2018-05-04 (×2): qty 0.1

## 2018-05-04 NOTE — Progress Notes (Signed)
Inpatient Diabetes Program Recommendations  AACE/ADA: New Consensus Statement on Inpatient Glycemic Control (2015)  Target Ranges:  Prepandial:   less than 140 mg/dL      Peak postprandial:   less than 180 mg/dL (1-2 hours)      Critically ill patients:  140 - 180 mg/dL   Results for Sally Hendrix, Sally Hendrix (MRN [id number]) as of 05/04/2018 10:05  Ref. Range 05/03/2018 06:58 05/03/2018 16:01 05/03/2018 17:22 05/03/2018 21:51 05/04/2018 08:10  Glucose-Capillary Latest Ref Range: 70 - 99 mg/dL 369 (H) 282 (H) 246 (H) 246 (H) 296 (H)   Review of Glycemic Control  Diabetes history: DM 2 Outpatient Diabetes mdications: Metformin 500 mg Daily Current orders for Inpatient glycemic control: Novolog 0-9 units tid  Inpatient Diabetes Program Recommendations:    Glucose trends in the upper 200 level. Consider Lantus 10 units Q24 hours. Starting now. Could also consider increasing Novolog to 0-15 units tid.  Thanks,  Tama Headings RN, MSN, BC-ADM Inpatient Diabetes Coordinator Team Pager [phone] (8a-5p)

## 2018-05-04 NOTE — Progress Notes (Signed)
Pt's O2 sats dropped as low as 79% while she was sleeping. Per pt she has OSA but does not use CPAP at home. Pt placed on 2L via Manuel Garcia. Will continue to monitor the pt. Hoover Brunette, RN

## 2018-05-04 NOTE — Progress Notes (Addendum)
Subjective: Patient is doing significantly better today.  She is more alert, able to hold a conversation, able to follow commands.  Exam: Vitals:   05/04/18 0819 05/04/18 0825  BP:  (!) 151/93  Pulse:  89  Resp:  20  Temp: 97.9 F (36.6 C)   SpO2:  93%    Physical Exam   HEENT-  Normocephalic, no lesions, without obvious abnormality.  Normal external eye and conjunctiva.   Extremities- Warm, dry and intact Musculoskeletal-no joint tenderness, deformity or swelling Skin-warm and dry, no hyperpigmentation, vitiligo, or suspicious lesions    Neuro:  Mental Status: Alert, oriented, thought content appropriate.  Speech fluent without evidence of aphasia.  Able to follow 3 step commands without difficulty.  Name objects, able to take her right thumb-touch her left ear-point to the ceiling. Cranial Nerves: II:  Visual fields grossly normal,  III,IV, VI: ptosis not present, extra-ocular motions intact bilaterally pupils equal, round, reactive to light and accommodation V,VII: smile symmetric, facial light touch sensation normal bilaterally VIII: hearing normal bilaterally IX,X: uvula rises symmetrically XI: bilateral shoulder shrug XII: midline tongue extension Motor: Right : Upper extremity   5/5    Left:     Upper extremity   5/5  Lower extremity   5/5     Lower extremity   5/5 Tone and bulk:normal tone throughout; no atrophy noted Sensory: Pinprick and light touch intact throughout, bilaterally Deep Tendon Reflexes: 2+ and symmetric throughout Plantars: Right: downgoing   Left: downgoing Cerebellar: normal finger-to-nose, normal rapid alternating movements and normal heel-to-shin test Gait: Not tested    Medications:  Scheduled: . carvedilol  12.5 mg Oral Q supper  . carvedilol  25 mg Oral Q breakfast  . digoxin  125 mcg Oral Daily  . enoxaparin (LOVENOX) injection  40 mg Subcutaneous Q24H  . feeding supplement (ENSURE ENLIVE)  237 mL Oral BID BM  . furosemide  20 mg  Oral QODAY  . insulin aspart  0-5 Units Subcutaneous QHS  . insulin aspart  0-9 Units Subcutaneous TID WC  . LORazepam  2 mg Intravenous Once  . oxyCODONE  5 mg Oral Once  . ticagrelor  90 mg Oral BID    Pertinent Labs/Diagnostics: CSF clear, glucose 189, red blood cells 101, other cells too few to count, colorless, protein 88, WBCs 5, Awaiting HSV PCR, along with IgG CSF and serum  EEG: This is an abnormal EEG secondary to posterior background slowing.  This finding may be seen with a diffuse gray matter disturbance that is etiologically nonspecific, but may include a dementia or post-ictal effect, among other possibilities.  No epileptiform activity is noted.       IMPRESSION: CT HEAD: 1. No acute intracranial process.  Small LEFT scalp hematoma. 2. Redemonstration of 18 x 10 mm LEFT frontal meningioma without mass effect. 3. Otherwise negative non-contrast CT HEAD for age. CT CERVICAL SPINE: 1. Moderately motion degraded examination. 2. No fracture.  Grade 1 C3-4 anterolisthesis. 3. Severe C3-4 neural foraminal narrowing. Electronically Signed   By: Elon Alas M.D.   On: 05/03/2018 01:00    Mr Brain Wo Contrast  Result Date: 05/03/2018  IMPRESSION: 1. Moderately motion degraded examination. 2. Signal abnormality bilateral hippocampi favored as artifact. Though, given new onset seizures this may be postictal; infectious (HSV) or inflammatory encephalitis should be excluded. Recommend correlation with CSF studies. Repeat MRI with contrast could be considered, with sedation. 3. Redemonstration of 18 x 10 mm LEFT frontal meningioma without mass effect.  4. Acute findings discussed with and reconfirmed by PA Joaquin Courts on 05/03/2018 at 5:08 am. Electronically Signed   By: Elon Alas M.D.   On: 05/03/2018 05:11   Dg Fluoro Guide Lumbar Puncture  Result Date: 05/03/2018  IMPRESSION: Fluoroscopic guided lumbar puncture with opening pressure of 20 cc H2O. 9 cc clear CSF obtained for  appropriate laboratory evaluation. The patient tolerated the procedure well without immediate complications. Electronically Signed   By: Marijo Sanes M.D.   On: 05/03/2018 12:12     Etta Quill PA-C [facility] [phone]     Assessment: 62 year old female presenting with encephalopathy and MRI findings of strikingly symmetric hazy signal abnormality in bilateral hippocampi and medial hypothalami bilaterally.   1. There is question if this may be secondary to postictal, infectious (HSV or other viral etiology) or inflammatory.  Regarding the latter, limbic encephalitis is on the DDx.  2. Today patient is doing significantly better - alert, oriented and able to follow commands along with ability to make conversation.  Recommendations: -Awaiting CSF HSV PCR, IgG CSF and serum - Continue thiamine 500 mg IV 3 times daily x3 days followed by 250 mg IV 3 times daily x3 days and then 100 mg p.o. Daily. -Continue Keppra 500 mg twice daily may be switched to p.o. once able to take p.o. medications -We will continue to follow the patient   Electronically signed: Dr. Kerney Elbe 05/04/2018, 9:58 AM

## 2018-05-04 NOTE — Progress Notes (Signed)
PROGRESS NOTE  Sally Hendrix BTD:[id number] DOB: 08/09/1956 DOA: 05/02/2018 PCP: Leanna Battles, MD  HPI/Recap of past 24 hours:  Feeling better No more seizures, no fever, denies headache Reports generalized weakness Husband at bedside  Assessment/Plan: Principal Problem:   Seizure ([place]) Active Problems:   Chronic systolic heart failure ([place])   Essential hypertension   DM2 (diabetes mellitus, type 2) ([place])   Hypomagnesemia   Hyponatremia   Seizures ([place])  Seizuresx3: -"She had been her usual state of health except for a cough for which she had completed antibiotics 3 days ago. He also reports over the last couple of days she complained of "feeling like she was given a pass out". These episodes were brief few and would subside with rest." she had seizurex1 at home during the night , husband called EMS. She had another seizure during transport, the third was in the ED -denies h/o alcohol, denies h/o drug use -mri brain concerns for" infectious (HSV) or inflammatory encephalitis " -EEG no seizure spikes -s/p floroscope guided LP on 7/29, csf with elevated protein, elevated glucose, 5 wbc, HSV pcr negative -hiv screen negative -she is started on acyclovir, keppra, high dose thiamine per neurology recommendation -will follow neurology recommendation -seizure precaution  HTN:  Her blood pressure on presentation was 180/108 -this has improved, bp normalized on current regimen  CAD s/p DES in LAD in 12/2016 -denies chest pain -continue homes,  - it dose not appear she has followed up with cardiology Dr Acie Fredrickson since 12/2016  H/o chronic systolic chf, Ef has improved, continue home meds, euvolemic  Type II diabetes Only on metformin at home, but review past medical record, her a1c was 12.3 in 12/2016 -her blood glucose is elevated -will repeat a1c -start lantus qhs, meal coverage insulin and ssi -diabetes education, likely need insulin at discharge if a1c is significantly  elevated  CKDII Cr stable at baseline   Code Status: full  Family Communication: patient and husband  Disposition Plan: likely home in 1-2 days, need neurology clearance, needs PT   Consultants:  neurology  Procedures:  Fluoroscope guided LP on 7/29  Antibiotics:  antiviral acyclovir    Objective: BP 134/90 (BP Location: Right Arm)   Pulse 87   Temp 98 F (36.7 C)   Resp 19   Ht 5\' 6"  (1.676 m)   Wt 63 kg (138 lb 14.2 oz)   SpO2 93%   BMI 22.42 kg/m   Intake/Output Summary (Last 24 hours) at 05/04/2018 1532 Last data filed at 05/04/2018 0819 Gross per 24 hour  Intake 226.25 ml  Output 1 ml  Net 225.25 ml   Filed Weights   05/02/18 2358 05/04/18 1251  Weight: 68 kg (150 lb) 63 kg (138 lb 14.2 oz)    Exam: Patient is examined daily including today on 05/04/2018, exams remain the same as of yesterday except that has changed    General:  NAD  Cardiovascular: RRR  Respiratory: CTABL  Abdomen: Soft/ND/NT, positive BS  Musculoskeletal: No Edema  Neuro: alert, oriented   Data Reviewed: Basic Metabolic Panel: Recent Labs  Lab 05/02/18 2357 05/03/18 0002 05/04/18 0619  NA 134* 133* 137  K 4.1 4.2 4.0  CL 97* 97* 104  CO2 25  --  27  GLUCOSE 355* 362* 246*  BUN 19 23 12   CREATININE 1.03* 0.90 1.02*  CALCIUM 9.2  --  8.3*  MG 1.7  --   --    Liver Function Tests: Recent Labs  Lab  05/02/18 2357  AST 19  ALT 16  ALKPHOS 116  BILITOT 0.4  PROT 7.0  ALBUMIN 3.0*   No results for input(s): LIPASE, AMYLASE in the last 168 hours. No results for input(s): AMMONIA in the last 168 hours. CBC: Recent Labs  Lab 05/02/18 2357 05/03/18 0002 05/04/18 0619  WBC 13.7*  --  9.6  NEUTROABS 9.0*  --   --   HGB 13.5 13.6 12.1  HCT 41.2 40.0 37.6  MCV 86.7  --  87.6  PLT 512*  --  416*   Cardiac Enzymes:   No results for input(s): CKTOTAL, CKMB, CKMBINDEX, TROPONINI in the last 168 hours. BNP (last 3 results) Recent Labs    02/11/18 1400    BNP 36.0    ProBNP (last 3 results) No results for input(s): PROBNP in the last 8760 hours.  CBG: Recent Labs  Lab 05/03/18 1601 05/03/18 1722 05/03/18 2151 05/04/18 0810 05/04/18 1229  GLUCAP 282* 246* 246* 296* 280*    Recent Results (from the past 240 hour(s))  MRSA PCR Screening     Status: None   Collection Time: 05/03/18  4:51 PM  Result Value Ref Range Status   MRSA by PCR NEGATIVE NEGATIVE Final    Comment:        The GeneXpert MRSA Assay (FDA approved for NASAL specimens only), is one component of a comprehensive MRSA colonization surveillance program. It is not intended to diagnose MRSA infection nor to guide or monitor treatment for MRSA infections. Performed at [facility], [place] [address] [place], [place] [address]      Studies: No results found.  Scheduled Meds: . carvedilol  12.5 mg Oral Q supper  . carvedilol  25 mg Oral Q breakfast  . digoxin  125 mcg Oral Daily  . enoxaparin (LOVENOX) injection  40 mg Subcutaneous Q24H  . feeding supplement (ENSURE ENLIVE)  237 mL Oral BID BM  . furosemide  20 mg Oral QODAY  . insulin aspart  0-5 Units Subcutaneous QHS  . insulin aspart  0-9 Units Subcutaneous TID WC  . LORazepam  2 mg Intravenous Once  . oxyCODONE  5 mg Oral Once  . ticagrelor  90 mg Oral BID    Continuous Infusions: . sodium chloride 75 mL/hr at 05/04/18 0824  . acyclovir 680 mg (05/04/18 1253)  . levETIRAcetam 500 mg (05/04/18 0832)  . thiamine injection 500 mg (05/04/18 1039)     Time spent: 45mins I have personally reviewed and interpreted on  05/04/2018 daily labs, tele strips, imagings as discussed above under date review session and assessment and plans.  I reviewed all nursing notes, pharmacy notes, consultant notes,  vitals, pertinent old records  I have discussed plan of care as described above with RN , patient and family on 05/04/2018   Florencia Reasons MD, PhD  [facility] Pager [phone]. If 7PM-7AM, please  contact night-coverage at [email or web address], password [facility] 05/04/2018, 3:32 PM  LOS: 0 days

## 2018-05-04 NOTE — Progress Notes (Addendum)
CRITICAL VALUE ALERT  Critical Value:  CBG 436  Date & Time Notied:  05/04/18 @ 7703  Provider Notified: MD Erlinda Hong  Orders Received/Actions taken: Give 9 units of insulin and recheck.  1724-Repeat CBG 406. 9 more units given.  1803- Repeat CBG is 313. MD made aware

## 2018-05-04 NOTE — Progress Notes (Signed)
Initial Nutrition Assessment  DOCUMENTATION CODES:   Not applicable  INTERVENTION:  Switch to Glucerna Shake po TID, each supplement provides 220 kcal and 10 grams of protein due to CBGs  NUTRITION DIAGNOSIS:   Inadequate oral intake related to acute illness as evidenced by per patient/family report.  GOAL:   Patient will meet greater than or equal to 90% of their needs   MONITOR:   PO intake, Supplement acceptance  REASON FOR ASSESSMENT:   Malnutrition Screening Tool    ASSESSMENT:   Patient with PMH OSA, obesity, HTN, DM, CHF who presents with 3 new seizures. MRI suggests signal abnormality in bilateral hippocampi and medial hyper thalami.   Spoke with patient and family at bedside. She reports decreased appetite for the past month related to having a cold and bronchitis. Family states she was eating about 50% of her normal intake. During this time she reports a 10 pound/6.8% severe weight loss from her UBW of 146 pounds. Patient reported some issues swallowing early on during this admission but this has resolved now. Had some oatmeal and eggs this morning and is also drinking ensure.  Medications reviewed and include:  Insulin Acyclovir gtt Thiamine 500mg  TID   Labs reviewed:  CBGs 246, 246, 296  NUTRITION - FOCUSED PHYSICAL EXAM:    Most Recent Value  Orbital Region  No depletion  Upper Arm Region  No depletion  Thoracic and Lumbar Region  No depletion  Buccal Region  No depletion  Temple Region  No depletion  Clavicle Bone Region  No depletion  Clavicle and Acromion Bone Region  No depletion  Scapular Bone Region  No depletion  Dorsal Hand  No depletion  Patellar Region  Mild depletion  Anterior Thigh Region  Mild depletion  Posterior Calf Region  Mild depletion  Edema (RD Assessment)  None  Hair  Reviewed  Eyes  Reviewed  Mouth  Reviewed  Skin  Reviewed  Nails  Reviewed       Diet Order:   Diet Order           Diet heart healthy/carb modified  Room service appropriate? Yes; Fluid consistency: Thin  Diet effective now          EDUCATION NEEDS:   No education needs have been identified at this time  Skin:  Skin Assessment: Reviewed RN Assessment  Last BM:  05/02/2018  Height:   Ht Readings from Last 1 Encounters:  05/02/18 5\' 6"  (1.676 m)    Weight:   Wt Readings from Last 1 Encounters:  05/04/18 138 lb 14.2 oz (63 kg)    Ideal Body Weight:  59.09 kg  BMI:  Body mass index is 22.42 kg/m.  Estimated Nutritional Needs:   Kcal:  1500-1650 calories  Protein:  63-69 grams (1-1.1g/kg))  Fluid:  >1.5L    Sally Anis. Mackenzi Krogh, MS, RD LDN Inpatient Clinical Dietitian Pager [phone]

## 2018-05-05 ENCOUNTER — Inpatient Hospital Stay (HOSPITAL_COMMUNITY): Payer: Self-pay

## 2018-05-05 DIAGNOSIS — I5022 Chronic systolic (congestive) heart failure: Secondary | ICD-10-CM

## 2018-05-05 DIAGNOSIS — I10 Essential (primary) hypertension: Secondary | ICD-10-CM

## 2018-05-05 LAB — LIPID PANEL
CHOL/HDL RATIO: 7.2 ratio
CHOLESTEROL: 224 mg/dL — AB (ref 0–200)
HDL: 31 mg/dL — ABNORMAL LOW (ref >40)
LDL Cholesterol: 126 mg/dL — ABNORMAL HIGH (ref 0–99)
Triglycerides: 337 mg/dL — ABNORMAL HIGH (ref <150)
VLDL: 67 mg/dL — ABNORMAL HIGH (ref 0–40)

## 2018-05-05 LAB — HEMOGLOBIN A1C
Hgb A1c MFr Bld: 12.9 % — ABNORMAL HIGH (ref 4.8–5.6)
MEAN PLASMA GLUCOSE: 323.53 mg/dL

## 2018-05-05 LAB — CBC
HCT: 36.8 % (ref 36.0–46.0)
Hemoglobin: 11.8 g/dL — ABNORMAL LOW (ref 12.0–15.0)
MCH: 28 pg (ref 26.0–34.0)
MCHC: 32.1 g/dL (ref 30.0–36.0)
MCV: 87.2 fL (ref 78.0–100.0)
PLATELETS: 404 10*3/uL — AB (ref 150–400)
RBC: 4.22 MIL/uL (ref 3.87–5.11)
RDW: 13.5 % (ref 11.5–15.5)
WBC: 9.2 10*3/uL (ref 4.0–10.5)

## 2018-05-05 LAB — BASIC METABOLIC PANEL
ANION GAP: 10 (ref 5–15)
BUN: 15 mg/dL (ref 8–23)
CALCIUM: 8.6 mg/dL — AB (ref 8.9–10.3)
CO2: 24 mmol/L (ref 22–32)
Chloride: 103 mmol/L (ref 98–111)
Creatinine, Ser: 1.1 mg/dL — ABNORMAL HIGH (ref 0.44–1.00)
GFR calc Af Amer: >60 mL/min (ref >60)
GFR, EST NON AFRICAN AMERICAN: 52 mL/min — AB (ref >60)
Glucose, Bld: 246 mg/dL — ABNORMAL HIGH (ref 70–99)
POTASSIUM: 3.5 mmol/L (ref 3.5–5.1)
SODIUM: 137 mmol/L (ref 135–145)

## 2018-05-05 LAB — GLUCOSE, CAPILLARY
GLUCOSE-CAPILLARY: 306 mg/dL — AB (ref 70–99)
GLUCOSE-CAPILLARY: 265 mg/dL — AB (ref 70–99)
Glucose-Capillary: 229 mg/dL — ABNORMAL HIGH (ref 70–99)

## 2018-05-05 LAB — MAGNESIUM: MAGNESIUM: 1.8 mg/dL (ref 1.7–2.4)

## 2018-05-05 MED ORDER — LEVETIRACETAM 500 MG PO TABS: 500.0000 mg | ORAL_TABLET | Freq: Two times a day (BID) | ORAL | 0 refills | Status: DC

## 2018-05-05 MED ORDER — LIVING WELL WITH DIABETES BOOK
Freq: Once | Status: AC
  Administered 2018-05-05: 12:00:00
  Filled 2018-05-05: qty 1

## 2018-05-05 MED ORDER — INSULIN PEN NEEDLE 32G X 4 MM MISC: 1.0000 | Freq: Two times a day (BID) | 0 refills | Status: DC

## 2018-05-05 MED ORDER — INSULIN ISOPHANE & REGULAR (HUMAN 70-30)100 UNIT/ML KWIKPEN: 15.0000 [IU] | PEN_INJECTOR | Freq: Two times a day (BID) | SUBCUTANEOUS | 0 refills | Status: AC

## 2018-05-05 MED ORDER — GADOBENATE DIMEGLUMINE 529 MG/ML IV SOLN
14.0000 mL | Freq: Once | INTRAVENOUS | Status: AC | PRN
  Administered 2018-05-05: 14 mL via INTRAVENOUS

## 2018-05-05 MED ORDER — INSULIN STARTER KIT- SYRINGES (ENGLISH)
1.0000 | Freq: Once | Status: AC
  Administered 2018-05-05: 1
  Filled 2018-05-05: qty 1

## 2018-05-05 NOTE — Progress Notes (Signed)
Edgardo Roys to be D/C'd home per MD order. Discussed with the patient and all questions fully answered. Skin without evidence of skin break down, no evidence of skin tears noted.  IV catheter discontinued intact. Site without signs and symptoms of complications. Dressing and pressure applied. Was able to correctly administer insulin injection. An After Visit Summary and prescriptions were printed and given to the patient.  Patient ambulated off unit with husband, and D/C home via private auto.  Melonie [place]  05/05/2018 7:56 PM

## 2018-05-05 NOTE — Discharge Summary (Signed)
Physician Discharge Summary  Sally Hendrix KGM:[id number] DOB: 10/20/1955 DOA: 05/02/2018  PCP: Leanna Battles, MD  Admit date: 05/02/2018 Discharge date: 05/05/2018  Admitted From: Home Disposition:  Home  Recommendations for Outpatient Follow-up:  1. Follow up with PCP in 1-2 weeks 2. Follow up with Neurology. Recommendation to wean keppra to off as outpatient  Discharge Condition:Improved CODE STATUS:Full Diet recommendation: Diabetic   Brief/Interim Summary: 62 y.o. female with medical history significant for non-specific cardiomyopathy, htn, dm,sleep apnea presents to the emergency department with the chief complaint of seizure. [facility] are asked to admit.  Information is obtained from the husband who is at the bedside and the patient noting that information from patient may be unreliable do to postictal phase. Husband reports he went to bed last night. She had been her usual state of health except for a cough for which she had completed antibiotics 3 days ago. He also reports over the last couple of days she complained of "feeling like she was given a pass out". These episodes were brief few and would subside with rest. During the night he "heard her fall". He reports she was initially unresponsive and he got her but to bed. He called 911 and he was not at all sure what was going on. Reportedly during the and support patient had a second seizure. No report of any incontinence. She did not hit her head. She did bite her tongue. Patient arouses easily and denies pain. She reports  doctor gave her "penicillin" and that she completed the course 3 days ago. She denies headache chest pain palpitation shortness of breath. She denies any nausea vomiting abdominal pain dysuria hematuria frequency or urgency.she denies diarrhea constipation melena bright red blood per rectum.denies drug use denies EtOH use   ED Course: in the emergency department patient's afebrile hypertensive mildly  tachycardic not hypoxic. She had a third tonic-clonic type seizure while in the ED. She was provided with 1000 mg of IV. At the time of admission she is lethargic but easily aroused. She responds to questions and commands  Seizuresx3: -"She had been her usual state of health except for a cough for which she had completed antibiotics 3 days ago. He also reports over the last couple of days she complained of "feeling like she was given a pass out". These episodes were brief few and would subside with rest." she had seizurex1 at home during the night , husband called EMS. She had another seizure during transport, the third was in the ED -denies h/o alcohol, denies h/o drug use -mri brain concerns for" infectious (HSV) or inflammatory encephalitis " -EEG no seizure spikes -s/p floroscope guided LP on 7/29, csf with elevated protein, elevated glucose, 5 wbc, HSV pcr negative -hiv screen negative -Pt was continued on acyclovir, keppra, high dose thiamine per neurology recommendation -Repeat MRI performed on 7/31 and reviewed with Neurology. Improvement noted per Neurologist, OK for discharge with close outpatient follow up with Neurology -Recommendation to continue on keppra with recs to wean to off as outpatient -Per Neurologist, OK to drive as patient does not carry dx of epilepsy  HTN:  Her blood pressure on presentation was 180/108 -this has improved, bp normalized on current regimen -Resume home meds  CAD s/p DES in LAD in 12/2016 -denies chest pain -continue homes,  - it dose not appear she has followed up with cardiology Dr Acie Fredrickson since 12/2016  H/o chronic systolic chf, Ef has improved, continue home meds, euvolemic  Type II diabetes  Only on metformin at home, but review past medical record, her a1c was 12.3 in 12/2016 -her blood glucose is elevated -Discussed with diabetic coordinator. Recommendation to discharge on 70-30 insulin at 15 units BID  CKDII Cr stable at  baseline   Discharge Diagnoses:  Principal Problem:   Seizure [facility]) Active Problems:   Chronic systolic heart failure ([place])   Essential hypertension   DM2 (diabetes mellitus, type 2) ([place])   Hypomagnesemia   Hyponatremia   Seizures ([place])    Discharge Instructions  Discharge Instructions    Ambulatory referral to Neurology   Complete by:  As directed    An appointment is requested in approximately: 2 weeks for new onset seizures     Allergies as of 05/05/2018   No Known Allergies     Medication List    STOP taking these medications   gabapentin 300 MG capsule Commonly known as:  NEURONTIN   metFORMIN 500 MG tablet Commonly known as:  GLUCOPHAGE     TAKE these medications   aspirin 81 MG tablet Take 1 tablet (81 mg total) by mouth daily.   carvedilol 25 MG tablet Commonly known as:  COREG Take 1.5 tablets (37.5 mg total) by mouth 2 (two) times daily. What changed:    how much to take  when to take this  additional instructions   digoxin 0.125 MG tablet Commonly known as:  LANOXIN Take 1 tablet (125 mcg total) by mouth daily.   furosemide 20 MG tablet Commonly known as:  LASIX Take 1 tablet (20 mg total) by mouth daily. What changed:  when to take this   Insulin Isophane & Regular Human (70-30) 100 UNIT/ML PEN Commonly known as:  NOVOLIN 70/30 FLEXPEN RELION Inject 15 Units into the skin 2 (two) times daily before a meal.   Insulin Pen Needle 32G X 4 MM Misc 1 Device by Does not apply route 2 (two) times daily.   levETIRAcetam 500 MG tablet Commonly known as:  KEPPRA Take 1 tablet (500 mg total) by mouth 2 (two) times daily.   losartan 100 MG tablet Commonly known as:  COZAAR Take 1 tablet (100 mg total) by mouth daily.   nitroGLYCERIN 0.4 MG SL tablet Commonly known as:  NITROSTAT Place 1 tablet (0.4 mg total) under the tongue every 5 (five) minutes as needed for chest pain.   spironolactone 25 MG tablet Commonly known as:   ALDACTONE Take 1 tablet (25 mg total) by mouth daily. What changed:    when to take this  reasons to take this   ticagrelor 90 MG Tabs tablet Commonly known as:  BRILINTA Take 1 tablet (90 mg total) by mouth 2 (two) times daily.      Follow-up Information    Leanna Battles, MD. Schedule an appointment as soon as possible for a visit in 1 week(s).   Specialty:  Internal Medicine Contact information: [address] [place] [address] [phone]          No Known Allergies  Consultations:  Neurology  Procedures/Studies: Ct Head Wo Contrast  Result Date: 05/03/2018 CLINICAL DATA:  New onset witnessed seizures. Hypertensive. History of diabetes. EXAM: CT HEAD WITHOUT CONTRAST CT CERVICAL SPINE WITHOUT CONTRAST TECHNIQUE: Multidetector CT imaging of the head and cervical spine was performed following the standard protocol without intravenous contrast. Multiplanar CT image reconstructions of the cervical spine were also generated. COMPARISON:  MRI of the head April 13, 2018 FINDINGS: CT HEAD FINDINGS BRAIN: No intraparenchymal hemorrhage, mass effect  nor midline shift. The ventricles and sulci are normal. No acute large vascular territory infarcts. No abnormal extra-axial fluid collections. Basal cisterns are patent. 18 x 10 mm LEFT frontal extra-axial mass previously characterized as meningioma without significant mass effect. VASCULAR: Mild calcific atherosclerosis carotid siphons. SKULL/SOFT TISSUES: No skull fracture. Small LEFT parietal scalp hematoma without subcutaneous gas or radiopaque foreign bodies. ORBITS/SINUSES: The included ocular globes and orbital contents are normal.Mild paranasal sinus mucosal thickening. Bilateral mastoid effusions. No air cell coalescence. OTHER: None. CT CERVICAL SPINE FINDINGS-moderately motion degraded examination. ALIGNMENT: Straightened cervical lordosis. Grade 1 C3-4 anterolisthesis. SKULL BASE AND VERTEBRAE: Vertebral bodies intact.  Severe C5-6 disc height loss with proportional endplate spurring compatible with degenerative discs, moderate to severe at C6-7 and moderate at C7-T1. C1-2 articulation maintained with moderate arthropathy. Multilevel moderate facet arthropathy. No destructive bony lesions. SOFT TISSUES AND SPINAL CANAL: Nonacute. Mild calcific atherosclerosis carotid bifurcations. DISC LEVELS: Severe C3-4 neural foraminal narrowing. Moderate to severe LEFT C5-6 and LEFT C6-7 neural foraminal narrowing. Mild canal stenosis C5-6 and C6-7. UPPER CHEST: Lung apices are clear. OTHER: None. IMPRESSION: CT HEAD: 1. No acute intracranial process.  Small LEFT scalp hematoma. 2. Redemonstration of 18 x 10 mm LEFT frontal meningioma without mass effect. 3. Otherwise negative non-contrast CT HEAD for age. CT CERVICAL SPINE: 1. Moderately motion degraded examination. 2. No fracture.  Grade 1 C3-4 anterolisthesis. 3. Severe C3-4 neural foraminal narrowing. Electronically Signed   By: Elon Alas M.D.   On: 05/03/2018 01:00   Ct Cervical Spine Wo Contrast  Result Date: 05/03/2018 CLINICAL DATA:  New onset witnessed seizures. Hypertensive. History of diabetes. EXAM: CT HEAD WITHOUT CONTRAST CT CERVICAL SPINE WITHOUT CONTRAST TECHNIQUE: Multidetector CT imaging of the head and cervical spine was performed following the standard protocol without intravenous contrast. Multiplanar CT image reconstructions of the cervical spine were also generated. COMPARISON:  MRI of the head April 13, 2018 FINDINGS: CT HEAD FINDINGS BRAIN: No intraparenchymal hemorrhage, mass effect nor midline shift. The ventricles and sulci are normal. No acute large vascular territory infarcts. No abnormal extra-axial fluid collections. Basal cisterns are patent. 18 x 10 mm LEFT frontal extra-axial mass previously characterized as meningioma without significant mass effect. VASCULAR: Mild calcific atherosclerosis carotid siphons. SKULL/SOFT TISSUES: No skull fracture.  Small LEFT parietal scalp hematoma without subcutaneous gas or radiopaque foreign bodies. ORBITS/SINUSES: The included ocular globes and orbital contents are normal.Mild paranasal sinus mucosal thickening. Bilateral mastoid effusions. No air cell coalescence. OTHER: None. CT CERVICAL SPINE FINDINGS-moderately motion degraded examination. ALIGNMENT: Straightened cervical lordosis. Grade 1 C3-4 anterolisthesis. SKULL BASE AND VERTEBRAE: Vertebral bodies intact. Severe C5-6 disc height loss with proportional endplate spurring compatible with degenerative discs, moderate to severe at C6-7 and moderate at C7-T1. C1-2 articulation maintained with moderate arthropathy. Multilevel moderate facet arthropathy. No destructive bony lesions. SOFT TISSUES AND SPINAL CANAL: Nonacute. Mild calcific atherosclerosis carotid bifurcations. DISC LEVELS: Severe C3-4 neural foraminal narrowing. Moderate to severe LEFT C5-6 and LEFT C6-7 neural foraminal narrowing. Mild canal stenosis C5-6 and C6-7. UPPER CHEST: Lung apices are clear. OTHER: None. IMPRESSION: CT HEAD: 1. No acute intracranial process.  Small LEFT scalp hematoma. 2. Redemonstration of 18 x 10 mm LEFT frontal meningioma without mass effect. 3. Otherwise negative non-contrast CT HEAD for age. CT CERVICAL SPINE: 1. Moderately motion degraded examination. 2. No fracture.  Grade 1 C3-4 anterolisthesis. 3. Severe C3-4 neural foraminal narrowing. Electronically Signed   By: Elon Alas M.D.   On: 05/03/2018 01:00   Mr  Brain Wo Contrast  Result Date: 05/03/2018 CLINICAL DATA:  New onset seizures, altered mental status. History of hypertension, diabetes. EXAM: MRI HEAD WITHOUT CONTRAST TECHNIQUE: Multiplanar, multiecho pulse sequences of the brain and surrounding structures were obtained without intravenous contrast. COMPARISON:  CT HEAD May 03, 2018 and MRI of the head Feb 11, 2018. FINDINGS: Moderately motion degraded examination. INTRACRANIAL CONTENTS: Slightly greater  than expected reduced diffusion bilateral hippocampi with FLAIR T2 hyperintense signal though inconsistent between sequences. No ADC abnormality. No midline shift or mass effect. No parenchymal brain volume loss for age. No hydrocephalus. No abnormal extra-axial fluid collections. Similar appearance of 18 x 10 mm low signal LEFT frontal dural based mass consistent with meningioma. VASCULAR: Normal major intracranial vascular flow voids present at skull base. SKULL AND UPPER CERVICAL SPINE: No abnormal sellar expansion. No suspicious calvarial bone marrow signal. Craniocervical junction maintained. Small LEFT frontal scalp hematoma. SINUSES/ORBITS: Mild paranasal sinus mucosal thickening. Bilateral mastoid effusions.The included ocular globes and orbital contents are non-suspicious. OTHER: None. IMPRESSION: 1. Moderately motion degraded examination. 2. Signal abnormality bilateral hippocampi favored as artifact. Though, given new onset seizures this may be postictal; infectious (HSV) or inflammatory encephalitis should be excluded. Recommend correlation with CSF studies. Repeat MRI with contrast could be considered, with sedation. 3. Redemonstration of 18 x 10 mm LEFT frontal meningioma without mass effect. 4. Acute findings discussed with and reconfirmed by PA Joaquin Courts on 05/03/2018 at 5:08 am. Electronically Signed   By: Elon Alas M.D.   On: 05/03/2018 05:11   Mr Jeri Cos EV Contrast  Result Date: 05/05/2018 CLINICAL DATA:  In cephalopathy. Seizures. Questionable bilateral hippocampal abnormality on prior MRI. EXAM: MRI HEAD WITHOUT AND WITH CONTRAST TECHNIQUE: Multiplanar, multiecho pulse sequences of the brain and surrounding structures were obtained without and with intravenous contrast. CONTRAST:  60mL MULTIHANCE GADOBENATE DIMEGLUMINE 529 MG/ML IV SOLN COMPARISON:  Brain MRIs 05/03/2018 and 02/11/2018 FINDINGS: Brain: There is no evidence of acute infarct, intracranial hemorrhage, midline shift, or  extra-axial fluid collection. Trace diffusion signal intensity in the hippocampi is symmetric and similar to that on the 02/11/2018 study without reduced ADC. There is however again the suggestion of mildly increased T2/FLAIR signal symmetrically in the hippocampi. The 02/11/2018 study utilized propeller T2 and FLAIR sequences which limits comparison. The remainder of the brain is normal in signal, and no abnormal parenchymal enhancement is identified. A 1.9 x 1.0 cm enhancing extra-axial mass over the left frontal convexity is unchanged from 02/2018 and is without significant associated brain mass effect or edema. Vascular: Major intracranial vascular flow voids are preserved. Skull and upper cervical spine: Unremarkable bone marrow signal. Sinuses/Orbits: Unremarkable orbits. Minimal mucosal thickening in the paranasal sinuses. Small mastoid effusions. Other: None. IMPRESSION: 1. Persistent suggestion of mild symmetric T2 signal abnormality in the hippocampi, of uncertain significance. As previously mentioned, the sequelae of recent seizure activity or infectious/inflammatory encephalitis are possible. 2. No infarct or other acute intracranial abnormality. 3. Unchanged left frontal meningioma. Electronically Signed   By: Logan Bores M.D.   On: 05/05/2018 17:57   Dg Fluoro Guide Lumbar Puncture  Result Date: 05/03/2018 CLINICAL DATA:  Fever and mental status changes. EXAM: DIAGNOSTIC LUMBAR PUNCTURE UNDER FLUOROSCOPIC GUIDANCE FLUOROSCOPY TIME:  Fluoroscopy Time:  0 minutes and 36 seconds Radiation Exposure Index (if provided by the fluoroscopic device): 3.1 mGy Number of Acquired Spot Images: 0 PROCEDURE: Informed consent was obtained from the patient prior to the procedure, including potential complications of headache, allergy, and pain. With  the patient prone, the lower back was prepped with Betadine. 1% Lidocaine was used for local anesthesia. Lumbar puncture was performed at the L3-4 level using a 20  gauge needle with return of clear CSF with an opening pressure of 20 cm water. 9 ml of CSF were obtained for laboratory studies. The patient tolerated the procedure well and there were no apparent complications. IMPRESSION: Fluoroscopic guided lumbar puncture with opening pressure of 20 cc H2O. 9 cc clear CSF obtained for appropriate laboratory evaluation. The patient tolerated the procedure well without immediate complications. Electronically Signed   By: Marijo Sanes M.D.   On: 05/03/2018 12:12     Subjective: Eager to go home  Discharge Exam: Vitals:   05/05/18 0851 05/05/18 1754  BP: (!) 145/95 (!) 181/93  Pulse: 88 82  Resp: (!) 28   Temp: 98 F (36.7 C) 98.7 F (37.1 C)  SpO2: 96% 97%   Vitals:   05/05/18 0549 05/05/18 0831 05/05/18 0851 05/05/18 1754  BP: (!) 151/85  (!) 145/95 (!) 181/93  Pulse: 95 89 88 82  Resp: (!) 21  (!) 28   Temp:  98 F (36.7 C) 98 F (36.7 C) 98.7 F (37.1 C)  TempSrc:   Oral Oral  SpO2: 94%  96% 97%  Weight:      Height:        General: Pt is alert, awake, not in acute distress Cardiovascular: RRR, S1/S2 +, no rubs, no gallops Respiratory: CTA bilaterally, no wheezing, no rhonchi Abdominal: Soft, NT, ND, bowel sounds + Extremities: no edema, no cyanosis   The results of significant diagnostics from this hospitalization (including imaging, microbiology, ancillary and laboratory) are listed below for reference.     Microbiology: Recent Results (from the past 240 hour(s))  MRSA PCR Screening     Status: None   Collection Time: 05/03/18  4:51 PM  Result Value Ref Range Status   MRSA by PCR NEGATIVE NEGATIVE Final    Comment:        The GeneXpert MRSA Assay (FDA approved for NASAL specimens only), is one component of a comprehensive MRSA colonization surveillance program. It is not intended to diagnose MRSA infection nor to guide or monitor treatment for MRSA infections. Performed at [facility], [place] [address]  [place], [place] [address]      Labs: BNP (last 3 results) Recent Labs    02/11/18 1400  BNP 92.0   Basic Metabolic Panel: Recent Labs  Lab 05/02/18 2357 05/03/18 0002 05/04/18 0619 05/05/18 0411  NA 134* 133* 137 137  K 4.1 4.2 4.0 3.5  CL 97* 97* 104 103  CO2 25  --  27 24  GLUCOSE 355* 362* 246* 246*  BUN 19 23 12 15   CREATININE 1.03* 0.90 1.02* 1.10*  CALCIUM 9.2  --  8.3* 8.6*  MG 1.7  --   --  1.8   Liver Function Tests: Recent Labs  Lab 05/02/18 2357  AST 19  ALT 16  ALKPHOS 116  BILITOT 0.4  PROT 7.0  ALBUMIN 3.0*   No results for input(s): LIPASE, AMYLASE in the last 168 hours. No results for input(s): AMMONIA in the last 168 hours. CBC: Recent Labs  Lab 05/02/18 2357 05/03/18 0002 05/04/18 0619 05/05/18 0411  WBC 13.7*  --  9.6 9.2  NEUTROABS 9.0*  --   --   --   HGB 13.5 13.6 12.1 11.8*  HCT 41.2 40.0 37.6 36.8  MCV 86.7  --  87.6 87.2  PLT 512*  --  416* 404*   Cardiac Enzymes: No results for input(s): CKTOTAL, CKMB, CKMBINDEX, TROPONINI in the last 168 hours. BNP: Invalid input(s): POCBNP CBG: Recent Labs  Lab 05/04/18 1803 05/04/18 2223 05/05/18 0808 05/05/18 1203 05/05/18 1747  GLUCAP 313* 173* 306* 265* 229*   D-Dimer No results for input(s): DDIMER in the last 72 hours. Hgb A1c Recent Labs    05/05/18 0411  HGBA1C 12.9*   Lipid Profile Recent Labs    05/05/18 0411  CHOL 224*  HDL 31*  LDLCALC 126*  TRIG 337*  CHOLHDL 7.2   Thyroid function studies No results for input(s): TSH, T4TOTAL, T3FREE, THYROIDAB in the last 72 hours.  Invalid input(s): FREET3 Anemia work up No results for input(s): VITAMINB12, FOLATE, FERRITIN, TIBC, IRON, RETICCTPCT in the last 72 hours. Urinalysis    Component Value Date/Time   COLORURINE STRAW (A) 05/02/2018 2358   APPEARANCEUR CLEAR 05/02/2018 2358   LABSPEC 1.012 05/02/2018 2358   PHURINE 5.0 05/02/2018 2358   GLUCOSEU >=500 (A) 05/02/2018 2358   HGBUR NEGATIVE 05/02/2018 2358    BILIRUBINUR NEGATIVE 05/02/2018 2358   KETONESUR NEGATIVE 05/02/2018 2358   PROTEINUR 30 (A) 05/02/2018 2358   NITRITE NEGATIVE 05/02/2018 2358   LEUKOCYTESUR NEGATIVE 05/02/2018 2358   Sepsis Labs Invalid input(s): PROCALCITONIN,  WBC,  LACTICIDVEN Microbiology Recent Results (from the past 240 hour(s))  MRSA PCR Screening     Status: None   Collection Time: 05/03/18  4:51 PM  Result Value Ref Range Status   MRSA by PCR NEGATIVE NEGATIVE Final    Comment:        The GeneXpert MRSA Assay (FDA approved for NASAL specimens only), is one component of a comprehensive MRSA colonization surveillance program. It is not intended to diagnose MRSA infection nor to guide or monitor treatment for MRSA infections. Performed at [facility], [place] [address], [place], [place] [address]    Time spent: 45min  SIGNED:   Marylu Lund, MD  [facility] 05/05/2018, 7:15 PM  If 7PM-7AM, please contact night-coverage [email or web address] Password [email or web address]

## 2018-05-05 NOTE — Progress Notes (Signed)
Inpatient Diabetes Program   AACE/ADA: New Consensus Statement on Inpatient Glycemic Control (2015)  Target Ranges:  Prepandial:   less than 140 mg/dL      Peak postprandial:   less than 180 mg/dL (1-2 hours)      Critically ill patients:  140 - 180 mg/dL    Home meds: Metformin [phone] mg 1-2 times a day depending on side effect severity and Glipizide 5 or 10 mg Daily (patient can't remember dose)  Spoke with patient about diabetes and home regimen for diabetes control. Patient reports that she is followed by an internist, Dr. Sharlett Iles for diabetes management. Her last visit was almost 1 year ago.  Patient does not have insurance and was not wanting to do insulin. Spoke with patient about her current A1c level of 12.9% and discussed A1c and glucose goals. Explained oral DM medications can only lower her A1c 1-2 % each. Discussed with patient [facility] options of 70/30 insulin. Patients husband says the insulin pen for $43/month is doable.  Discussed importance of checking CBGs and maintaining good CBG control to prevent long-term and short-term complications. Explained how hyperglycemia leads to damage within blood vessels which lead to the common complications seen with uncontrolled diabetes. Stressed to the patient the importance of improving glycemic control to prevent further complications from uncontrolled diabetes. Discussed impact of nutrition, exercise, stress, sickness, and medications on diabetes control. Patient reports drinking regular sodas all the time. Discussed carbohydrates, carbohydrate goals per day and meal, along with portion sizes. Encouraged patient to check glucose 2 times per day (fasting and alternating second check) and to keep a log book of glucose readings and insulin taken which will need to be taken to doctor appointments.   Educated patient and spouse on insulin pen use at home. Reviewed contents of insulin flexpen starter kit. Reviewed all steps if insulin pen  including attachment of needle, 2-unit air shot, dialing up dose, giving injection, removing needle, disposal of sharps, storage of unused insulin, disposal of insulin etc. Patient able to provide successful return demonstration. Also reviewed troubleshooting with insulin pen. MD to give patient Rxs for insulin pens and insulin pen needles.   [employer] 70/30 insulin 15 units BID (order #[id number]) Insulin pen needles (order #[id number]) Price list from [place] given with meter information   Thanks,  Tama Headings RN, MSN, [facility] Inpatient Diabetes Coordinator Team Pager [phone] (8a-5p)

## 2018-05-05 NOTE — Evaluation (Signed)
Physical Therapy Evaluation Patient Details Name: Sally Hendrix MRN: [id number] DOB: 02-24-1956 Today's Date: 05/05/2018   History of Present Illness  Pt is a 62 y.o. female with a Past Medical History of OSA; obesity; HTN; DM; and CHF (EF 50-55% in 3/18). She presented to the ED with 3 new-onset seizures.     Clinical Impression  PT eval complete. Pt is independent with all functional mobility. See below for further details. Plan is for repeat MRI then d/c home. No further PT intervention indicated. Recommend pt ambulate in hallway with staff or family. Pt/family have no further questions or concerns. PT signing off.     Follow Up Recommendations No PT follow up;Supervision - Intermittent    Equipment Recommendations  None recommended by PT    Recommendations for Other Services       Precautions / Restrictions Precautions Precautions: None Restrictions Weight Bearing Restrictions: No      Mobility  Bed Mobility Overal bed mobility: Independent                Transfers Overall transfer level: Independent Equipment used: None                Ambulation/Gait Ambulation/Gait assistance: Supervision Gait Distance (Feet): 500 Feet Assistive device: None Gait Pattern/deviations: WFL(Within Functional Limits) Gait velocity: WNL Gait velocity interpretation: >2.62 ft/sec, indicative of community ambulatory General Gait Details: steady gait  Stairs            Wheelchair Mobility    Modified Rankin (Stroke Patients Only)       Balance Overall balance assessment: Modified Independent                                           Pertinent Vitals/Pain Pain Assessment: No/denies pain    Home Living Family/patient expects to be discharged to:: Private residence Living Arrangements: Spouse/significant other;Children;Other relatives Available Help at Discharge: Family;Available 24 hours/day Type of Home: House Home Access: Stairs to  enter Entrance Stairs-Rails: Psychiatric nurse of Steps: 4 Home Layout: One level Home Equipment: None      Prior Function Level of Independence: Independent         Comments: babysits her 8 y.o. granddaughter     Hand Dominance   Dominant Hand: Right    Extremity/Trunk Assessment   Upper Extremity Assessment Upper Extremity Assessment: Overall WFL for tasks assessed    Lower Extremity Assessment Lower Extremity Assessment: Overall WFL for tasks assessed    Cervical / Trunk Assessment Cervical / Trunk Assessment: Normal  Communication   Communication: No difficulties  Cognition Arousal/Alertness: Awake/alert Behavior During Therapy: WFL for tasks assessed/performed Overall Cognitive Status: Within Functional Limits for tasks assessed                                 General Comments: A&Ox 4. Follows multi steps commands. Responds appropriately.       General Comments      Exercises     Assessment/Plan    PT Assessment Patent does not need any further PT services  PT Problem List         PT Treatment Interventions      PT Goals (Current goals can be found in the Care Plan section)  Acute Rehab PT Goals Patient Stated Goal: home PT Goal Formulation: All assessment  and education complete, DC therapy    Frequency     Barriers to discharge        Co-evaluation               AM-PAC PT "6 Clicks" Daily Activity  Outcome Measure Difficulty turning over in bed (including adjusting bedclothes, sheets and blankets)?: None Difficulty moving from lying on back to sitting on the side of the bed? : None Difficulty sitting down on and standing up from a chair with arms (e.g., wheelchair, bedside commode, etc,.)?: None Help needed moving to and from a bed to chair (including a wheelchair)?: None Help needed walking in hospital room?: None Help needed climbing 3-5 steps with a railing? : A Little 6 Click Score: 23     End of Session   Activity Tolerance: Patient tolerated treatment well Patient left: in chair;with call bell/phone within reach;with family/visitor present Nurse Communication: Mobility status PT Visit Diagnosis: Difficulty in walking, not elsewhere classified (R26.2)    Time: 3435-6861 PT Time Calculation (min) (ACUTE ONLY): 18 min   Charges:   PT Evaluation $PT Eval Low Complexity: 1 Low          Lorrin Goodell, PT  Office # [phone] Pager [phone]   Lorriane Shire 05/05/2018, 11:59 AM

## 2018-05-05 NOTE — Progress Notes (Addendum)
Subjective: Again significantly improved.  Able to tell me that she is in [place], [place], the president.  She has no problem repeating MRI brain to make sure that everything is fine.  Had discussion about if symptoms return she should come back to the hospital and she agreed.   Exam: Vitals:   05/05/18 0549 05/05/18 0831  BP: (!) 151/85   Pulse: 95 89  Resp: (!) 21   Temp:  98 F (36.7 C)  SpO2: 94%     Physical Exam   HEENT-  Normocephalic, no lesions, without obvious abnormality.  Normal external eye and conjunctiva.   Extremities- Warm, dry and intact Musculoskeletal-no joint tenderness, deformity or swelling Skin-warm and dry, no hyperpigmentation, vitiligo, or suspicious lesions  Neuro:  Mental Status: As noted above patient is alert oriented able to follow commands, name objects, she knows that she is in [place], the month and the year.  Still slightly bradyphenic however has improved significantly from initial interactions with her. Cranial Nerves: II:  Visual fields grossly normal,  III,IV, VI: ptosis not present, extra-ocular motions intact bilaterally pupils equal, round, reactive to light and accommodation V,VII: smile symmetric, facial light touch sensation normal bilaterally VIII: hearing normal bilaterally IX,X: uvula rises midline XI: bilateral shoulder shrug XII: midline tongue extension Motor: Right : Upper extremity   5/5    Left:     Upper extremity   5/5  Lower extremity   5/5     Lower extremity   5/5 Tone and bulk:normal tone throughout; no atrophy noted Sensory: Pinprick and light touch intact throughout, bilaterally Deep Tendon Reflexes: 2+ and symmetric throughout Plantars: Right: downgoing   Left: downgoing Cerebellar: normal finger-to-nose, and normal heel-to-shin test     Medications:  Scheduled: . carvedilol  12.5 mg Oral Q supper  . carvedilol  25 mg Oral Q breakfast  . digoxin  125 mcg Oral Daily  . enoxaparin (LOVENOX)  injection  40 mg Subcutaneous Q24H  . feeding supplement (ENSURE ENLIVE)  237 mL Oral BID BM  . furosemide  20 mg Oral QODAY  . insulin aspart  0-5 Units Subcutaneous QHS  . insulin aspart  0-9 Units Subcutaneous TID WC  . insulin aspart  4 Units Subcutaneous TID WC  . insulin glargine  10 Units Subcutaneous QHS  . LORazepam  2 mg Intravenous Once  . oxyCODONE  5 mg Oral Once  . ticagrelor  90 mg Oral BID    Pertinent Labs/Diagnostics:  Glucose 246 Creatinine 1.10 GFR 52 Cholesterol 224 LDL 126 White blood cell count 9.2    Etta Quill PA-C [facility] [phone]   Assessment: 62 year old female presenting with encephalopathy and MRI findings of strikingly symmetric hazy signal abnormality in the bilateral hippocampal region and medial hypothalamic region bilaterally. HSV has come back negative.  Still question regarding if this is autoimmune/inflammatory.  As noted above patient continues to improve on a daily basis.  Recommendations: - Continue thiamine 500 mg IV 3 times daily x3 days followed by 250 mg IV 3 times daily x3 days and then 100 mg p.o. Daily. -We will repeat MRI with and without contrast to evaluate if there is any improvement -Continue Keppra 500 mg twice daily may be switched to p.o. once able to take p.o. medications -Per [facility] statutes, patients with seizures are not allowed to drive until  they have been seizure-free for six months. Use caution when using heavy equipment or power tools. Avoid working on ladders or at  heights. Take showers instead of baths. Ensure the water temperature is not too high on the home water heater. Do not go swimming alone. When caring for infants or small children, sit down when holding, feeding, or changing them to minimize risk of injury to the child in the event you have a seizure. Also, Maintain good sleep hygiene. Avoid alcohol.    Electronically signed: Dr. Kerney Elbe 05/05/2018, 9:11 AM

## 2018-05-05 NOTE — Progress Notes (Signed)
Inpatient Diabetes Program Recommendations  AACE/ADA: New Consensus Statement on Inpatient Glycemic Control (2015)  Target Ranges:  Prepandial:   less than 140 mg/dL      Peak postprandial:   less than 180 mg/dL (1-2 hours)      Critically ill patients:  140 - 180 mg/dL   Lab Results  Component Value Date   GLUCAP 306 (H) 05/05/2018   HGBA1C 12.9 (H) 05/05/2018   Review of Glycemic Control  Diabetes history: DM 2 Outpatient Diabetes mdications: Metformin 500 mg Daily Current orders for Inpatient glycemic control: Lantus 10 units, Novolog 0-9 units tid, Novolog 0-5 units qhs, Novolog 4 units tid meal coverage  Inpatient Diabetes Program Recommendations:    Fasting glucose 306 after Lantus 10 units. Please consider increasing Lantus to 18-20 units.  Received consult for DM and insulin teaching. Noted patient without insurance. Patient would benefit from 70/30 insulin at time of d/c unless she can follow up at one of our clinics. Can convert Lantus to 70/30 once glucose trends decrease.  Will see patient for insulin administration education. RN staff to reinforce with each insulin administration time and CBG check.  Thanks,  Tama Headings RN, MSN, BC-ADM Inpatient Diabetes Coordinator Team Pager [phone] (8a-5p)

## 2018-05-06 LAB — IGG CSF INDEX
Albumin CSF-mCnc: 50 mg/dL — ABNORMAL HIGH (ref 11–48)
Albumin: 2.8 g/dL — ABNORMAL LOW (ref 3.6–4.8)
CSF IgG Index: 0.5 (ref 0.0–0.7)
IgG (Immunoglobin G), Serum: 836 mg/dL (ref 700–1600)
IgG, CSF: 7.3 mg/dL (ref 0.0–8.6)
IgG/Alb Ratio, CSF: 0.15 (ref 0.00–0.25)

## 2018-05-06 LAB — HSV(HERPES SMPLX VRS)ABS-I+II(IGG)-CSF: HSV TYPE I/II AB, IGG CSF: 3.18 IV — AB (ref <=0.89)

## 2018-08-23 ENCOUNTER — Inpatient Hospital Stay (HOSPITAL_COMMUNITY): Payer: Self-pay | Admitting: Certified Registered Nurse Anesthetist

## 2018-08-23 ENCOUNTER — Encounter (HOSPITAL_COMMUNITY): Payer: Self-pay | Admitting: Ophthalmology

## 2018-08-23 ENCOUNTER — Other Ambulatory Visit: Payer: Self-pay

## 2018-08-23 ENCOUNTER — Ambulatory Visit (HOSPITAL_COMMUNITY)
Admission: RE | Admit: 2018-08-23 | Discharge: 2018-08-24 | Disposition: A | Discharge status: Home / Self Care | Payer: Self-pay | Source: Ambulatory Visit | Attending: Ophthalmology | Admitting: Ophthalmology

## 2018-08-23 ENCOUNTER — Encounter (HOSPITAL_COMMUNITY)
Admission: RE | Disposition: A | Discharge status: Home / Self Care | Payer: Self-pay | Source: Ambulatory Visit | Attending: Ophthalmology

## 2018-08-23 ENCOUNTER — Other Ambulatory Visit: Payer: Self-pay | Admitting: Ophthalmology

## 2018-08-23 DIAGNOSIS — G4733 Obstructive sleep apnea (adult) (pediatric): Secondary | ICD-10-CM | POA: Diagnosis present

## 2018-08-23 DIAGNOSIS — G4730 Sleep apnea, unspecified: Secondary | ICD-10-CM | POA: Insufficient documentation

## 2018-08-23 DIAGNOSIS — Z87891 Personal history of nicotine dependence: Secondary | ICD-10-CM | POA: Insufficient documentation

## 2018-08-23 DIAGNOSIS — H44002 Unspecified purulent endophthalmitis, left eye: Principal | ICD-10-CM | POA: Insufficient documentation

## 2018-08-23 DIAGNOSIS — I509 Heart failure, unspecified: Secondary | ICD-10-CM | POA: Insufficient documentation

## 2018-08-23 DIAGNOSIS — I110 Hypertensive heart disease with heart failure: Secondary | ICD-10-CM | POA: Insufficient documentation

## 2018-08-23 DIAGNOSIS — Z955 Presence of coronary angioplasty implant and graft: Secondary | ICD-10-CM | POA: Insufficient documentation

## 2018-08-23 DIAGNOSIS — H44022 Vitreous abscess (chronic), left eye: Secondary | ICD-10-CM | POA: Insufficient documentation

## 2018-08-23 DIAGNOSIS — H4389 Other disorders of vitreous body: Secondary | ICD-10-CM | POA: Insufficient documentation

## 2018-08-23 DIAGNOSIS — Z794 Long term (current) use of insulin: Secondary | ICD-10-CM | POA: Insufficient documentation

## 2018-08-23 DIAGNOSIS — Z9842 Cataract extraction status, left eye: Secondary | ICD-10-CM | POA: Insufficient documentation

## 2018-08-23 DIAGNOSIS — E119 Type 2 diabetes mellitus without complications: Secondary | ICD-10-CM | POA: Insufficient documentation

## 2018-08-23 DIAGNOSIS — F329 Major depressive disorder, single episode, unspecified: Secondary | ICD-10-CM | POA: Insufficient documentation

## 2018-08-23 DIAGNOSIS — B954 Other streptococcus as the cause of diseases classified elsewhere: Secondary | ICD-10-CM | POA: Insufficient documentation

## 2018-08-23 DIAGNOSIS — Z961 Presence of intraocular lens: Secondary | ICD-10-CM | POA: Insufficient documentation

## 2018-08-23 DIAGNOSIS — Z79899 Other long term (current) drug therapy: Secondary | ICD-10-CM | POA: Insufficient documentation

## 2018-08-23 DIAGNOSIS — I252 Old myocardial infarction: Secondary | ICD-10-CM | POA: Insufficient documentation

## 2018-08-23 HISTORY — PX: PARS PLANA VITRECTOMY: SHX2166

## 2018-08-23 LAB — CBC
HCT: 44.1 % (ref 36.0–46.0)
Hemoglobin: 14.4 g/dL (ref 12.0–15.0)
MCH: 28.7 pg (ref 26.0–34.0)
MCHC: 32.7 g/dL (ref 30.0–36.0)
MCV: 87.8 fL (ref 80.0–100.0)
PLATELETS: 297 10*3/uL (ref 150–400)
RBC: 5.02 MIL/uL (ref 3.87–5.11)
RDW: 13.2 % (ref 11.5–15.5)
WBC: 13 10*3/uL — ABNORMAL HIGH (ref 4.0–10.5)
nRBC: 0 % (ref 0.0–0.2)

## 2018-08-23 LAB — BASIC METABOLIC PANEL
ANION GAP: 10 (ref 5–15)
BUN: 25 mg/dL — ABNORMAL HIGH (ref 8–23)
CO2: 21 mmol/L — ABNORMAL LOW (ref 22–32)
Calcium: 9.7 mg/dL (ref 8.9–10.3)
Chloride: 106 mmol/L (ref 98–111)
Creatinine, Ser: 1.04 mg/dL — ABNORMAL HIGH (ref 0.44–1.00)
GFR calc Af Amer: >60 mL/min (ref >60)
GFR, EST NON AFRICAN AMERICAN: 56 mL/min — AB (ref >60)
Glucose, Bld: 146 mg/dL — ABNORMAL HIGH (ref 70–99)
POTASSIUM: 3.9 mmol/L (ref 3.5–5.1)
Sodium: 137 mmol/L (ref 135–145)

## 2018-08-23 LAB — HEMOGLOBIN A1C
Hgb A1c MFr Bld: 7.5 % — ABNORMAL HIGH (ref 4.8–5.6)
Mean Plasma Glucose: 168.55 mg/dL

## 2018-08-23 LAB — GLUCOSE, CAPILLARY
GLUCOSE-CAPILLARY: 140 mg/dL — AB (ref 70–99)
Glucose-Capillary: 141 mg/dL — ABNORMAL HIGH (ref 70–99)

## 2018-08-23 SURGERY — PARS PLANA VITRECTOMY 25 GAUGE FOR ENDOPHTHALMITIS
Anesthesia: General | Site: Eye | Laterality: Left

## 2018-08-23 MED ORDER — SUCCINYLCHOLINE CHLORIDE 20 MG/ML IJ SOLN
INTRAMUSCULAR | Status: DC | PRN
  Administered 2018-08-23: 100 mg via INTRAVENOUS

## 2018-08-23 MED ORDER — ACETAMINOPHEN 325 MG PO TABS
650.0000 mg | ORAL_TABLET | Freq: Four times a day (QID) | ORAL | Status: DC | PRN
  Administered 2018-08-23 – 2018-08-24 (×2): 650 mg via ORAL
  Filled 2018-08-23 (×2): qty 2

## 2018-08-23 MED ORDER — ROCURONIUM BROMIDE 50 MG/5ML IV SOSY
PREFILLED_SYRINGE | INTRAVENOUS | Status: DC | PRN
  Administered 2018-08-23: 20 mg via INTRAVENOUS
  Administered 2018-08-23: 10 mg via INTRAVENOUS

## 2018-08-23 MED ORDER — DEXAMETHASONE SODIUM PHOSPHATE 10 MG/ML IJ SOLN
INTRAMUSCULAR | Status: AC
  Filled 2018-08-23: qty 1

## 2018-08-23 MED ORDER — SODIUM HYALURONATE 10 MG/ML IO SOLN
INTRAOCULAR | Status: AC
  Filled 2018-08-23: qty 0.85

## 2018-08-23 MED ORDER — LIDOCAINE 2% (20 MG/ML) 5 ML SYRINGE
INTRAMUSCULAR | Status: AC
  Filled 2018-08-23: qty 5

## 2018-08-23 MED ORDER — ONDANSETRON HCL 4 MG/2ML IJ SOLN
INTRAMUSCULAR | Status: AC
  Filled 2018-08-23: qty 2

## 2018-08-23 MED ORDER — NA CHONDROIT SULF-NA HYALURON 40-30 MG/ML IO SOLN
INTRAOCULAR | Status: AC
  Filled 2018-08-23: qty 0.5

## 2018-08-23 MED ORDER — FENTANYL CITRATE (PF) 250 MCG/5ML IJ SOLN
INTRAMUSCULAR | Status: AC
  Filled 2018-08-23: qty 5

## 2018-08-23 MED ORDER — SODIUM CHLORIDE 0.9 % IV SOLN
INTRAVENOUS | Status: DC
  Administered 2018-08-23 (×2): via INTRAVENOUS

## 2018-08-23 MED ORDER — VANCOMYCIN INTRAVITREAL INJECTION 1 MG/0.1 ML
1.0000 mg | INTRAOCULAR | Status: DC
  Filled 2018-08-23: qty 0.1

## 2018-08-23 MED ORDER — FENTANYL CITRATE (PF) 100 MCG/2ML IJ SOLN
INTRAMUSCULAR | Status: DC | PRN
  Administered 2018-08-23: 50 ug via INTRAVENOUS

## 2018-08-23 MED ORDER — VANCOMYCIN HCL IN DEXTROSE 1-5 GM/200ML-% IV SOLN
1000.0000 mg | Freq: Once | INTRAVENOUS | Status: AC
  Administered 2018-08-23: 1000 mg via INTRAVENOUS

## 2018-08-23 MED ORDER — ROCURONIUM BROMIDE 50 MG/5ML IV SOSY
PREFILLED_SYRINGE | INTRAVENOUS | Status: AC
  Filled 2018-08-23: qty 5

## 2018-08-23 MED ORDER — PREDNISOLONE ACETATE 1 % OP SUSP
1.0000 [drp] | Freq: Four times a day (QID) | OPHTHALMIC | Status: DC
  Administered 2018-08-24: 1 [drp] via OPHTHALMIC
  Filled 2018-08-23: qty 5

## 2018-08-23 MED ORDER — PROPOFOL 10 MG/ML IV BOLUS
INTRAVENOUS | Status: DC | PRN
  Administered 2018-08-23: 150 mg via INTRAVENOUS

## 2018-08-23 MED ORDER — SODIUM CHLORIDE 0.9 % IV SOLN
INTRAVENOUS | Status: DC | PRN
  Administered 2018-08-23: 25 ug/min via INTRAVENOUS

## 2018-08-23 MED ORDER — CARVEDILOL 12.5 MG PO TABS
ORAL_TABLET | ORAL | Status: AC
  Filled 2018-08-23: qty 2

## 2018-08-23 MED ORDER — CYCLOPENTOLATE HCL 1 % OP SOLN
1.0000 [drp] | OPHTHALMIC | Status: AC | PRN
  Administered 2018-08-23 (×3): 1 [drp] via OPHTHALMIC
  Filled 2018-08-23: qty 2

## 2018-08-23 MED ORDER — MIDAZOLAM HCL 5 MG/5ML IJ SOLN
INTRAMUSCULAR | Status: DC | PRN
  Administered 2018-08-23: 2 mg via INTRAVENOUS

## 2018-08-23 MED ORDER — GATIFLOXACIN 0.5 % OP SOLN
1.0000 [drp] | Freq: Four times a day (QID) | OPHTHALMIC | Status: DC
  Administered 2018-08-24: 1 [drp] via OPHTHALMIC

## 2018-08-23 MED ORDER — SODIUM CHLORIDE 0.9 % IV SOLN
1.0000 g | Freq: Once | INTRAVENOUS | Status: AC
  Administered 2018-08-23: 1 g via INTRAVENOUS
  Filled 2018-08-23: qty 1

## 2018-08-23 MED ORDER — SUGAMMADEX SODIUM 200 MG/2ML IV SOLN
INTRAVENOUS | Status: DC | PRN
  Administered 2018-08-23: 300 mg via INTRAVENOUS

## 2018-08-23 MED ORDER — PROPOFOL 10 MG/ML IV BOLUS
INTRAVENOUS | Status: AC
  Filled 2018-08-23: qty 20

## 2018-08-23 MED ORDER — EPINEPHRINE PF 1 MG/ML IJ SOLN
INTRAOCULAR | Status: DC | PRN
  Administered 2018-08-23: 20:00:00

## 2018-08-23 MED ORDER — FENTANYL CITRATE (PF) 100 MCG/2ML IJ SOLN: 25.0000 ug | INTRAMUSCULAR | Status: DC | PRN

## 2018-08-23 MED ORDER — LIDOCAINE HCL 2 % IJ SOLN
INTRAMUSCULAR | Status: AC
  Filled 2018-08-23: qty 20

## 2018-08-23 MED ORDER — TETRACAINE HCL 0.5 % OP SOLN
OPHTHALMIC | Status: AC
  Filled 2018-08-23: qty 4

## 2018-08-23 MED ORDER — NA CHONDROIT SULF-NA HYALURON 40-30 MG/ML IO SOLN
INTRAOCULAR | Status: DC | PRN
  Administered 2018-08-23 (×2): 0.5 mL via INTRAOCULAR

## 2018-08-23 MED ORDER — ONDANSETRON HCL 4 MG/2ML IJ SOLN
INTRAMUSCULAR | Status: DC | PRN
  Administered 2018-08-23: 4 mg via INTRAVENOUS

## 2018-08-23 MED ORDER — CARVEDILOL 25 MG PO TABS
25.0000 mg | ORAL_TABLET | Freq: Once | ORAL | Status: AC
  Administered 2018-08-23: 25 mg via ORAL
  Filled 2018-08-23: qty 1

## 2018-08-23 MED ORDER — POLYETHYLENE GLYCOL 3350 17 G PO PACK: 17.0000 g | PACK | Freq: Every day | ORAL | Status: DC | PRN

## 2018-08-23 MED ORDER — EPINEPHRINE PF 1 MG/ML IJ SOLN
INTRAMUSCULAR | Status: AC
  Filled 2018-08-23: qty 1

## 2018-08-23 MED ORDER — CYCLOPENTOLATE-PHENYLEPHRINE 0.2-1 % OP SOLN
1.0000 [drp] | OPHTHALMIC | Status: AC
  Administered 2018-08-24: 1 [drp] via OPHTHALMIC
  Filled 2018-08-23: qty 2

## 2018-08-23 MED ORDER — SODIUM CHLORIDE 0.9 % IV SOLN: INTRAVENOUS | Status: DC

## 2018-08-23 MED ORDER — PHENYLEPHRINE HCL 2.5 % OP SOLN
1.0000 [drp] | OPHTHALMIC | Status: AC | PRN
  Administered 2018-08-23 (×3): 1 [drp] via OPHTHALMIC
  Filled 2018-08-23: qty 2

## 2018-08-23 MED ORDER — PHENYLEPHRINE 40 MCG/ML (10ML) SYRINGE FOR IV PUSH (FOR BLOOD PRESSURE SUPPORT)
PREFILLED_SYRINGE | INTRAVENOUS | Status: AC
  Filled 2018-08-23: qty 10

## 2018-08-23 MED ORDER — GATIFLOXACIN 0.5 % OP SOLN
1.0000 [drp] | OPHTHALMIC | Status: AC | PRN
  Administered 2018-08-23 (×3): 1 [drp] via OPHTHALMIC
  Filled 2018-08-23: qty 2.5

## 2018-08-23 MED ORDER — LIDOCAINE HCL (CARDIAC) PF 100 MG/5ML IV SOSY
PREFILLED_SYRINGE | INTRAVENOUS | Status: DC | PRN
  Administered 2018-08-23: 30 mg via INTRAVENOUS

## 2018-08-23 MED ORDER — VANCOMYCIN HCL IN DEXTROSE 1-5 GM/200ML-% IV SOLN
INTRAVENOUS | Status: AC
  Filled 2018-08-23: qty 200

## 2018-08-23 MED ORDER — MIDAZOLAM HCL 2 MG/2ML IJ SOLN
INTRAMUSCULAR | Status: AC
  Filled 2018-08-23: qty 2

## 2018-08-23 MED ORDER — ONDANSETRON HCL 4 MG/2ML IJ SOLN: 4.0000 mg | Freq: Four times a day (QID) | INTRAMUSCULAR | Status: DC | PRN

## 2018-08-23 MED ORDER — SUCCINYLCHOLINE CHLORIDE 200 MG/10ML IV SOSY
PREFILLED_SYRINGE | INTRAVENOUS | Status: AC
  Filled 2018-08-23: qty 10

## 2018-08-23 MED ORDER — BSS PLUS IO SOLN
INTRAOCULAR | Status: AC
  Filled 2018-08-23: qty 500

## 2018-08-23 MED ORDER — CEFTAZIDIME INTRAVITREAL INJECTION 2.25 MG/0.1 ML
2.2500 mg | INTRAVITREAL | Status: DC
  Filled 2018-08-23: qty 0.1

## 2018-08-23 MED ORDER — PHENYLEPHRINE HCL 10 MG/ML IJ SOLN
INTRAMUSCULAR | Status: DC | PRN
  Administered 2018-08-23: 100 ug via INTRAVENOUS

## 2018-08-23 SURGICAL SUPPLY — 29 items
APPLICATOR COTTON TIP 6IN STRL (MISCELLANEOUS) ×2 IMPLANT
CANNULA VLV SOFT TIP 25G (OPHTHALMIC) ×1 IMPLANT
CANNULA VLV SOFT TIP 25GA (OPHTHALMIC) ×2 IMPLANT
COVER MAYO STAND STRL (DRAPES) ×2 IMPLANT
DRAPE OPHTHALMIC 77X100 STRL (CUSTOM PROCEDURE TRAY) ×2 IMPLANT
FORCEPS ECKARDT ILM 25G SERR (OPHTHALMIC RELATED) ×1 IMPLANT
GLOVE BIOGEL PI IND STRL 6.5 (GLOVE) IMPLANT
GLOVE BIOGEL PI INDICATOR 6.5 (GLOVE) ×1
GLOVE SS BIOGEL STRL SZ 8.5 (GLOVE) ×1 IMPLANT
GLOVE SUPERSENSE BIOGEL SZ 8.5 (GLOVE) ×1
GOWN STRL REUS W/ TWL LRG LVL3 (GOWN DISPOSABLE) ×1 IMPLANT
GOWN STRL REUS W/TWL LRG LVL3 (GOWN DISPOSABLE) ×4
KIT BASIN OR (CUSTOM PROCEDURE TRAY) ×2 IMPLANT
KIT TURNOVER KIT B (KITS) ×2 IMPLANT
LENS BIOM SUPER VIEW SET DISP (OPHTHALMIC RELATED) ×2 IMPLANT
NDL 18GX1X1/2 (RX/OR ONLY) (NEEDLE) ×1 IMPLANT
NDL 25GX 5/8IN NON SAFETY (NEEDLE) ×1 IMPLANT
NDL HYPO 30X.5 LL (NEEDLE) ×2 IMPLANT
NEEDLE 18GX1X1/2 (RX/OR ONLY) (NEEDLE) ×2 IMPLANT
NEEDLE 25GX 5/8IN NON SAFETY (NEEDLE) ×2 IMPLANT
NEEDLE HYPO 30X.5 LL (NEEDLE) ×4 IMPLANT
PACK VITRECTOMY CUSTOM (CUSTOM PROCEDURE TRAY) ×2 IMPLANT
PAD ARMBOARD 7.5X6 YLW CONV (MISCELLANEOUS) ×4 IMPLANT
PAK PIK VITRECTOMY CVS 25GA (OPHTHALMIC) ×2 IMPLANT
STOCKINETTE IMPERVIOUS 9X36 MD (GAUZE/BANDAGES/DRESSINGS) ×4 IMPLANT
SUT ETHILON 10 0 BV100 4 (SUTURE) ×1 IMPLANT
SWAB COLLECTION DEVICE MRSA (MISCELLANEOUS) ×2 IMPLANT
SWAB CULTURE ESWAB REG 1ML (MISCELLANEOUS) ×2 IMPLANT
SYR TB 1ML LUER SLIP (SYRINGE) ×5 IMPLANT

## 2018-08-23 NOTE — Op Note (Signed)
Preoperative diagnosis  Acute endophthalmitis left eye, post cataract surgery  Postoperative diagnosis  Same  Procedure  Posterior vitrectomy, diagnostic vitreous tap, injection of intravitreal antibiotics left eye  Fortaz 2.25 mg/0.1 mL, 0.1 mL injected  Vancomycin 1.0 mg/0.1 mL, 0.1 mL injected  Posterior synechia lysis and removal pupillary fibrin membrane left eye  Findings  Preretinal and what appeared to be intraretinal abscesses left eye  Surgeon Hurman Horn M.D.  Anesthesia general endotracheal anesthesia  Indications for procedure patient is a 77 a woman with less than 24 hour onset of profound vision loss the left eye followed 1 day by previously of pain in the left eye she is now 3 days status post cataract extraction internal lens placement found to have 3 mm hypopyon in the office today pupil or fiber membrane as well as dense vitreous debris suspected acute endophthalmitis post surgery.  Patient understands [place] in attempt to diagnose as well as to begin treatment of this condition. She understands the risks and benefits of no treatment as well as in intervention. She understands the mid intervention profound vision loss possible and depends largely on the type of organism found at time of surgery no matter what therapy is delivered.  Propacet signed consent was obtained patient taken the operating room. In the operating room description of procedure in detail  Appropriate monitoring was followed by general endotracheal anesthesia. Left eye region was identified surgical timeout with surgeon and staffed and thereafter sterile prep and drape was in carried out of the left eye. Lid speculum applied. 25-gauge trocar placed initially in the inferotemporal corneal area as a paracentesis to allow for low effusion pressures. Separate paracentesis was made using a 25-gauge trocar and a low is in flow vitrectomy is in carried out the anterior chamber and all of the hypopyon was  removed . This revealed dense pupillary fibrin membrane. Through the previous paracentesis made at time a cataract surgery which had been fibrin strands emanating from it I placed a 20-gauge serrated forceps and was able to remove remove the pupillary fibrin membrane of the visual axis.  Notable findings were of epithelial edema. I did create a a 4 mm central epithelial abrasion to keep the media and the visualization clear.  At this time I placed a 25-gauge trocar and the posterior to the limbus in the pars plana. I did not remove the infusion at this point. A place a separate of trocar in the superonasal nasal quadrant. Through this I passed light pipe intervals able to confirm that the per post inferotemporal trocar site was in the in the vitreous cavity. Infusion was now transferred to this inferotemporal pars plana region. The paracentesis trocar was removed in this is placed down the superotemporal pars plana region. At this time core vitrectomy is a begun. I remove the anterior hyaloid with its dense vitreous white opacities it was able to move to the mid vitreous and and finally to the posterior vitreous whereupon I was able to visualize the optic nerve which was covered and dense white debris as well as highlighting of the retinal vasculature the temporal arcades from the white cell margination. There appeared to be preretinal abscesses which I did not dare try to aspirate. Is unclear whether posterior hyaloid was still attached. Using an extrusion needle was was able to use reflux and was not able to remove disc material from the preretinal region thus I believe there may benefit posterior hyaloid a detachment. I deemed too dangerous to try  to remove this with normal techniques. Vitreous shaving was in carried out through 60 of the vitreous base. I did clear the visual axis from the lens all the way to the preretinal region posteriorly. Time I remove the instrument followed by the trochars and left  the inject pressure low. I close the the 20-gauge paracentesis site from the cataract surgery which is approximately 11:00 position. 10-0 nylon was used and not rotated to bury. All trochars removed. The vitreous washings were sent for diagnostic vitreous examination as well as cultures and Gram stain.  At this time intravitreal antibiotics listed above Fortaz and vancomycin injected 0.1 mL of each. Some conjunctival remnants of these were placed away from describing sites. At this time sterile patch function applied left eye. Patient awake from anesthesia taken the PACU in good stable condition.

## 2018-08-23 NOTE — H&P (Addendum)
62 year old woman with profound vision loss for 1 day, onset noted in the left eye this morning. She is admitted as an outpatient for emergency vitrectomy, vitreous diagnostic tap, and injection of intravitreal antibiotics left eye for suspected endophthalmitis acute, postsurgical.  Patient today is 3 days status post cataract extraction with intraocular lens placement left eye with a toric lens, at [facility] [place]. Patient was found on same operative day to have slightly elevated intraocular pressure, mild pain, and good vision.  She noted some mild pain 1 day prior to today, and then profound change of vision noted upon awakening this morning.  Vision left eye : Is light perception  Examination cornea is 2+ hazy. There is a 2-3 millimeter hypopyon in the anterior chamber with 4+ white cell reaction. There is a faint view of the anterior vitreous with white cells. There is no view posteriorly.  B-scan ultrasonography of the fundus discloses no retinal detachment, no masses and no tumors however dense vitreous opacification is noted.  Impression  #1 acute endophthalmitis, postsurgical left eye  History of cardiac disease  History of diabetes mellitus  History of sleep apnea and no treatment  Plan  Surgical intervention on an emergent basisso as to diagnosis as well as treatment underlying acute endophthalmitis. Risk and benefits of therapy discussed.  Prognosis is based upon the underlying organism.

## 2018-08-23 NOTE — Progress Notes (Signed)
D; pt. States no insuline any more. She took po med but took it today, next dose is tomorrow am.  Dr. Zadie Rhine wanted 23 hrs observation for sleep apnea , no machine using at home, keep O2 tonight.

## 2018-08-23 NOTE — Anesthesia Preprocedure Evaluation (Addendum)
Anesthesia Evaluation  Patient identified by MRN, date of birth, ID band Patient awake    Reviewed: Allergy & Precautions, H&P , NPO status , Patient's Chart, lab work & pertinent test results  Airway Mallampati: II  TM Distance: >3 FB Neck ROM: Full    Dental no notable dental hx. (+) Teeth Intact, Dental Advisory Given   Pulmonary sleep apnea , former smoker,    Pulmonary exam normal breath sounds clear to auscultation       Cardiovascular hypertension, Pt. on home beta blockers and Pt. on medications + Past MI, + Cardiac Stents and +CHF   Rhythm:Regular Rate:Normal     Neuro/Psych Seizures -,  Depression    GI/Hepatic negative GI ROS, Neg liver ROS,   Endo/Other  diabetes, Type 2, Insulin Dependent, Oral Hypoglycemic Agents  Renal/GU negative Renal ROS  negative genitourinary   Musculoskeletal   Abdominal   Peds  Hematology negative hematology ROS (+)   Anesthesia Other Findings   Reproductive/Obstetrics negative OB ROS                           Lab Results  Component Value Date   WBC 9.2 05/05/2018   HGB 11.8 (L) 05/05/2018   HCT 36.8 05/05/2018   MCV 87.2 05/05/2018   PLT 404 (H) 05/05/2018   Lab Results  Component Value Date   CREATININE 1.10 (H) 05/05/2018   BUN 15 05/05/2018   NA 137 05/05/2018   K 3.5 05/05/2018   CL 103 05/05/2018   CO2 24 05/05/2018   Lab Results  Component Value Date   INR 0.87 02/11/2018   INR 1.08 12/10/2016   INR 0.99 09/21/2010   Echo: - Left ventricle: The cavity size was normal. Wall thickness was   increased in a pattern of mild LVH. Systolic function was normal.   The estimated ejection fraction was in the range of 50% to 55%.   Wall motion was normal; there were no regional wall motion   abnormalities. - Left atrium: The atrium was mildly dilated.  Anesthesia Physical Anesthesia Plan  ASA: III and emergent  Anesthesia Plan:  General   Post-op Pain Management:    Induction: Intravenous, Rapid sequence and Cricoid pressure planned  PONV Risk Score and Plan: 4 or greater and Ondansetron, Midazolam and Treatment may vary due to age or medical condition  Airway Management Planned: Oral ETT  Additional Equipment: None  Intra-op Plan:   Post-operative Plan: Extubation in OR  Informed Consent: I have reviewed the patients History and Physical, chart, labs and discussed the procedure including the risks, benefits and alternatives for the proposed anesthesia with the patient or authorized representative who has indicated his/her understanding and acceptance.   Dental advisory given  Plan Discussed with: CRNA  Anesthesia Plan Comments:        Anesthesia Quick Evaluation

## 2018-08-23 NOTE — Anesthesia Postprocedure Evaluation (Signed)
Anesthesia Post Note  Patient: Sally Hendrix  Procedure(s) Performed: PARS PLANA VITRECTOMY 25 GAUGE FOR ENDOPHTHALMITIS (Left Eye)     Patient location during evaluation: PACU Anesthesia Type: General Level of consciousness: awake and alert Pain management: pain level controlled Vital Signs Assessment: post-procedure vital signs reviewed and stable Respiratory status: spontaneous breathing, nonlabored ventilation, respiratory function stable and patient connected to nasal cannula oxygen Cardiovascular status: blood pressure returned to baseline and stable Postop Assessment: no apparent nausea or vomiting Anesthetic complications: no    Last Vitals:  Vitals:   08/23/18 2015 08/23/18 2030  BP: 138/77 137/73  Pulse: 85 86  Resp: 17 19  Temp:    SpO2: 97% 100%    Last Pain:  Vitals:   08/23/18 2030  TempSrc:   PainSc: 0-No pain                 Quinnetta Roepke,W. EDMOND

## 2018-08-23 NOTE — Anesthesia Procedure Notes (Signed)
Procedure Name: Intubation Date/Time: 08/23/2018 6:54 PM Performed by: Eligha Bridegroom, CRNA Pre-anesthesia Checklist: Patient identified, Emergency Drugs available, Suction available, Patient being monitored and Timeout performed Patient Re-evaluated:Patient Re-evaluated prior to induction Oxygen Delivery Method: Circle system utilized Induction Type: IV induction, Rapid sequence and Cricoid Pressure applied Tube type: Oral Tube size: 7.0 mm Airway Equipment and Method: Stylet Placement Confirmation: ETT inserted through vocal cords under direct vision,  positive ETCO2 and breath sounds checked- equal and bilateral Secured at: 21 cm Tube secured with: Tape Dental Injury: Teeth and Oropharynx as per pre-operative assessment and Bloody posterior oropharynx

## 2018-08-23 NOTE — Transfer of Care (Signed)
Immediate Anesthesia Transfer of Care Note  Patient: Sally Hendrix  Procedure(s) Performed: PARS PLANA VITRECTOMY 25 GAUGE FOR ENDOPHTHALMITIS (Left Eye)  Patient Location: PACU  Anesthesia Type:General  Level of Consciousness: awake, alert  and oriented  Airway & Oxygen Therapy: Patient Spontanous Breathing and Patient connected to nasal cannula oxygen  Post-op Assessment: Report given to RN and Post -op Vital signs reviewed and stable  Post vital signs: Reviewed and stable  Last Vitals:  Vitals Value Taken Time  BP 142/80 08/23/2018  8:05 PM  Temp 36.4 C 08/23/2018  8:03 PM  Pulse 83 08/23/2018  8:08 PM  Resp 17 08/23/2018  8:08 PM  SpO2 94 % 08/23/2018  8:08 PM  Vitals shown include unvalidated device data.  Last Pain:  Vitals:   08/23/18 1814  TempSrc: Oral  PainSc: 4       Patients Stated Pain Goal: 5 (02/54/27 0623)  Complications: No apparent anesthesia complications

## 2018-08-23 NOTE — Progress Notes (Signed)
Dr. Ola Spurr notified of initial BP.

## 2018-08-23 NOTE — Brief Op Note (Signed)
Preoperative diagnosis  Acute endophthalmitis left eye, post cataract surgery  Postoperative diagnosis  Same  Procedure  Posterior vitrectomy, diagnostic vitreous tap, injection of intravitreal antibiotics left eye  Fortaz 2.25 mg/0.1 mL, 0.1 mL injected  Vancomycin 1.0 mg/0.1 mL, 0.1 mL injected  Posterior synechia lysis and removal pupillary fibrin membrane left eye  Findings  Preretinal and what appeared to be intraretinal abscesses left eye  Surgeon Dominica Severin a [place] M.D.

## 2018-08-24 ENCOUNTER — Encounter (HOSPITAL_COMMUNITY): Payer: Self-pay | Admitting: Ophthalmology

## 2018-08-24 LAB — GRAM STAIN

## 2018-08-24 MED ORDER — PREDNISOLONE ACETATE 1 % OP SUSP: 1.0000 [drp] | Freq: Four times a day (QID) | OPHTHALMIC | 0 refills | Status: DC

## 2018-08-24 MED ORDER — GATIFLOXACIN 0.5 % OP SOLN: 1.0000 [drp] | Freq: Four times a day (QID) | OPHTHALMIC | 0 refills | Status: DC

## 2018-08-24 NOTE — Progress Notes (Signed)
Spoke with Dr. Zadie Rhine last night 2245 to clarify eye drop orders. Per Dr. Zadie Rhine Pred Granville Lewis and Zymaxid are to start at 0700 and Cyclomydril is to be given as scheduled.

## 2018-08-24 NOTE — Plan of Care (Signed)
  Problem: Clinical Measurements: Goal: Respiratory complications will improve Outcome: Progressing Goal: Cardiovascular complication will be avoided Outcome: Progressing   Problem: Coping: Goal: Level of anxiety will decrease Outcome: Progressing   Problem: Pain Managment: Goal: General experience of comfort will improve Outcome: Progressing   

## 2018-08-24 NOTE — Progress Notes (Signed)
Patient given discharge instructions and verbalized understanding. Patient aware of immediate doctor follow up. Patient left unit in stable condition.

## 2018-08-26 ENCOUNTER — Other Ambulatory Visit: Payer: Self-pay | Admitting: Ophthalmology

## 2018-08-26 ENCOUNTER — Encounter (HOSPITAL_COMMUNITY)
Admission: EM | Disposition: A | Discharge status: Home / Self Care | Payer: Self-pay | Source: Ambulatory Visit | Attending: Ophthalmology

## 2018-08-26 ENCOUNTER — Inpatient Hospital Stay (HOSPITAL_COMMUNITY): Payer: Self-pay | Admitting: Anesthesiology

## 2018-08-26 ENCOUNTER — Encounter (HOSPITAL_COMMUNITY): Payer: Self-pay

## 2018-08-26 ENCOUNTER — Ambulatory Visit (HOSPITAL_COMMUNITY)
Admission: EM | Admit: 2018-08-26 | Discharge: 2018-08-26 | Disposition: A | Discharge status: Home / Self Care | Payer: Self-pay | Source: Ambulatory Visit | Attending: Ophthalmology | Admitting: Ophthalmology

## 2018-08-26 DIAGNOSIS — Z955 Presence of coronary angioplasty implant and graft: Secondary | ICD-10-CM | POA: Insufficient documentation

## 2018-08-26 DIAGNOSIS — R569 Unspecified convulsions: Secondary | ICD-10-CM | POA: Insufficient documentation

## 2018-08-26 DIAGNOSIS — G4730 Sleep apnea, unspecified: Secondary | ICD-10-CM | POA: Insufficient documentation

## 2018-08-26 DIAGNOSIS — Z87891 Personal history of nicotine dependence: Secondary | ICD-10-CM | POA: Insufficient documentation

## 2018-08-26 DIAGNOSIS — F329 Major depressive disorder, single episode, unspecified: Secondary | ICD-10-CM | POA: Insufficient documentation

## 2018-08-26 DIAGNOSIS — B955 Unspecified streptococcus as the cause of diseases classified elsewhere: Secondary | ICD-10-CM | POA: Insufficient documentation

## 2018-08-26 DIAGNOSIS — I509 Heart failure, unspecified: Secondary | ICD-10-CM | POA: Insufficient documentation

## 2018-08-26 DIAGNOSIS — H4419 Other endophthalmitis: Secondary | ICD-10-CM | POA: Insufficient documentation

## 2018-08-26 DIAGNOSIS — Z7984 Long term (current) use of oral hypoglycemic drugs: Secondary | ICD-10-CM | POA: Insufficient documentation

## 2018-08-26 DIAGNOSIS — I110 Hypertensive heart disease with heart failure: Secondary | ICD-10-CM | POA: Insufficient documentation

## 2018-08-26 DIAGNOSIS — E118 Type 2 diabetes mellitus with unspecified complications: Secondary | ICD-10-CM | POA: Insufficient documentation

## 2018-08-26 HISTORY — PX: PARS PLANA VITRECTOMY: SHX2166

## 2018-08-26 LAB — EYE CULTURE

## 2018-08-26 LAB — GLUCOSE, CAPILLARY: Glucose-Capillary: 147 mg/dL — ABNORMAL HIGH (ref 70–99)

## 2018-08-26 SURGERY — PARS PLANA VITRECTOMY 25 GAUGE FOR ENDOPHTHALMITIS
Anesthesia: Monitor Anesthesia Care | Site: Eye | Laterality: Left

## 2018-08-26 MED ORDER — MIDAZOLAM HCL 2 MG/2ML IJ SOLN
INTRAMUSCULAR | Status: DC | PRN
  Administered 2018-08-26: 2 mg via INTRAVENOUS

## 2018-08-26 MED ORDER — MIDAZOLAM HCL 2 MG/2ML IJ SOLN
INTRAMUSCULAR | Status: AC
  Filled 2018-08-26: qty 2

## 2018-08-26 MED ORDER — FENTANYL CITRATE (PF) 250 MCG/5ML IJ SOLN
INTRAMUSCULAR | Status: AC
  Filled 2018-08-26: qty 5

## 2018-08-26 MED ORDER — DEXAMETHASONE SODIUM PHOSPHATE 10 MG/ML IJ SOLN
INTRAMUSCULAR | Status: DC | PRN
  Administered 2018-08-26: 10 mg

## 2018-08-26 MED ORDER — SODIUM HYALURONATE 10 MG/ML IO SOLN
INTRAOCULAR | Status: AC
  Filled 2018-08-26: qty 0.85

## 2018-08-26 MED ORDER — LIDOCAINE HCL (PF) 2 % IJ SOLN
INTRAMUSCULAR | Status: AC
  Filled 2018-08-26: qty 10

## 2018-08-26 MED ORDER — GATIFLOXACIN 0.5 % OP SOLN
OPHTHALMIC | Status: AC
  Administered 2018-08-26: 1 [drp] via OPHTHALMIC
  Filled 2018-08-26: qty 2.5

## 2018-08-26 MED ORDER — GATIFLOXACIN 0.5 % OP SOLN
1.0000 [drp] | OPHTHALMIC | Status: AC | PRN
  Administered 2018-08-26 (×3): 1 [drp] via OPHTHALMIC

## 2018-08-26 MED ORDER — OXYCODONE HCL 5 MG/5ML PO SOLN: 5.0000 mg | Freq: Once | ORAL | Status: DC | PRN

## 2018-08-26 MED ORDER — EPINEPHRINE PF 1 MG/ML IJ SOLN
INTRAMUSCULAR | Status: AC
  Filled 2018-08-26: qty 1

## 2018-08-26 MED ORDER — PROPOFOL 10 MG/ML IV BOLUS
INTRAVENOUS | Status: AC
  Filled 2018-08-26: qty 20

## 2018-08-26 MED ORDER — SODIUM CHLORIDE 0.9 % IV SOLN
INTRAVENOUS | Status: DC | PRN
  Administered 2018-08-26: 20:00:00 via INTRAVENOUS

## 2018-08-26 MED ORDER — PROMETHAZINE HCL 25 MG/ML IJ SOLN: 6.2500 mg | INTRAMUSCULAR | Status: DC | PRN

## 2018-08-26 MED ORDER — PHENYLEPHRINE HCL 2.5 % OP SOLN
OPHTHALMIC | Status: AC
  Administered 2018-08-26: 1 [drp] via OPHTHALMIC
  Filled 2018-08-26: qty 2

## 2018-08-26 MED ORDER — PHENYLEPHRINE HCL 2.5 % OP SOLN
1.0000 [drp] | OPHTHALMIC | Status: AC | PRN
  Administered 2018-08-26 (×3): 1 [drp] via OPHTHALMIC

## 2018-08-26 MED ORDER — FENTANYL CITRATE (PF) 100 MCG/2ML IJ SOLN: 25.0000 ug | INTRAMUSCULAR | Status: DC | PRN

## 2018-08-26 MED ORDER — VANCOMYCIN SUBCONJUNCTIVAL INJECTION 25 MG/0.5 ML
INTRAOCULAR | Status: DC | PRN
  Administered 2018-08-26: 10 mg via SUBCONJUNCTIVAL

## 2018-08-26 MED ORDER — LIDOCAINE 2% (20 MG/ML) 5 ML SYRINGE
INTRAMUSCULAR | Status: AC
  Filled 2018-08-26: qty 5

## 2018-08-26 MED ORDER — NA CHONDROIT SULF-NA HYALURON 40-30 MG/ML IO SOLN
INTRAOCULAR | Status: AC
  Filled 2018-08-26: qty 0.5

## 2018-08-26 MED ORDER — BSS PLUS IO SOLN
INTRAOCULAR | Status: AC
  Filled 2018-08-26: qty 500

## 2018-08-26 MED ORDER — BSS PLUS IO SOLN
INTRAOCULAR | Status: DC | PRN
  Administered 2018-08-26: 1 via INTRAOCULAR

## 2018-08-26 MED ORDER — CEFTAZIDIME INTRAVITREAL INJECTION 2.25 MG/0.1 ML
2.2500 mg | INTRAVITREAL | Status: DC
  Filled 2018-08-26: qty 0.1

## 2018-08-26 MED ORDER — SUFENTANIL CITRATE 50 MCG/ML IV SOLN
INTRAVENOUS | Status: AC
  Filled 2018-08-26: qty 1

## 2018-08-26 MED ORDER — CYCLOPENTOLATE HCL 1 % OP SOLN
OPHTHALMIC | Status: AC
  Administered 2018-08-26: 1 [drp] via OPHTHALMIC
  Filled 2018-08-26: qty 2

## 2018-08-26 MED ORDER — PROPOFOL 10 MG/ML IV BOLUS
INTRAVENOUS | Status: DC | PRN
  Administered 2018-08-26: 50 mg via INTRAVENOUS

## 2018-08-26 MED ORDER — DEXAMETHASONE SODIUM PHOSPHATE 10 MG/ML IJ SOLN
INTRAMUSCULAR | Status: AC
  Filled 2018-08-26: qty 1

## 2018-08-26 MED ORDER — SODIUM CHLORIDE 0.9 % IV SOLN
INTRAVENOUS | Status: DC
  Administered 2018-08-26: 19:00:00 via INTRAVENOUS

## 2018-08-26 MED ORDER — SODIUM CHLORIDE 0.9 % IV SOLN
1.0000 g | Freq: Once | INTRAVENOUS | Status: AC
  Administered 2018-08-26: 1 g via INTRAVENOUS
  Filled 2018-08-26: qty 1

## 2018-08-26 MED ORDER — MEPERIDINE HCL 50 MG/ML IJ SOLN: 6.2500 mg | INTRAMUSCULAR | Status: DC | PRN

## 2018-08-26 MED ORDER — LIDOCAINE HCL (CARDIAC) PF 100 MG/5ML IV SOSY
PREFILLED_SYRINGE | INTRAVENOUS | Status: DC | PRN
  Administered 2018-08-26: 100 mg via INTRATRACHEAL

## 2018-08-26 MED ORDER — TETRACAINE HCL 0.5 % OP SOLN
OPHTHALMIC | Status: DC | PRN
  Administered 2018-08-26: 2 [drp] via OPHTHALMIC

## 2018-08-26 MED ORDER — LIDOCAINE HCL 2 % IJ SOLN
INTRAMUSCULAR | Status: DC | PRN
  Administered 2018-08-26 (×2): 10 mL

## 2018-08-26 MED ORDER — DEXTROSE 5 % IV SOLN
1.0000 g | Freq: Once | INTRAVENOUS | Status: DC
  Filled 2018-08-26: qty 1

## 2018-08-26 MED ORDER — VANCOMYCIN INTRAVITREAL INJECTION 1 MG/0.1 ML
1.0000 mg | INTRAOCULAR | Status: DC
  Filled 2018-08-26: qty 0.1

## 2018-08-26 MED ORDER — EPINEPHRINE PF 1 MG/ML IJ SOLN
INTRAMUSCULAR | Status: DC | PRN
  Administered 2018-08-26: .3 mL

## 2018-08-26 MED ORDER — CYCLOPENTOLATE HCL 1 % OP SOLN
1.0000 [drp] | OPHTHALMIC | Status: AC | PRN
  Administered 2018-08-26 (×3): 1 [drp] via OPHTHALMIC

## 2018-08-26 MED ORDER — CEFTAZIDIME SUBCONJUCTIVAL INJECTION 100 MG/0.5 ML
SUBCONJUNCTIVAL | Status: DC | PRN
  Administered 2018-08-26: 22.5 mg via SUBCONJUNCTIVAL

## 2018-08-26 MED ORDER — VANCOMYCIN HCL IN DEXTROSE 1-5 GM/200ML-% IV SOLN
1000.0000 mg | Freq: Once | INTRAVENOUS | Status: AC
  Administered 2018-08-26: 1000 mg via INTRAVENOUS
  Filled 2018-08-26: qty 200

## 2018-08-26 MED ORDER — TETRACAINE HCL 0.5 % OP SOLN
OPHTHALMIC | Status: AC
  Filled 2018-08-26: qty 4

## 2018-08-26 MED ORDER — OXYCODONE HCL 5 MG PO TABS: 5.0000 mg | ORAL_TABLET | Freq: Once | ORAL | Status: DC | PRN

## 2018-08-26 SURGICAL SUPPLY — 61 items
APL SRG 3 HI ABS STRL LF PLS (MISCELLANEOUS)
APPLICATOR COTTON TIP 6IN STRL (MISCELLANEOUS) ×3 IMPLANT
APPLICATOR DR MATTHEWS STRL (MISCELLANEOUS) IMPLANT
BLADE MVR KNIFE 20G (BLADE) IMPLANT
CANNULA ANT CHAM MAIN (OPHTHALMIC RELATED) IMPLANT
CANNULA VLV SOFT TIP 25G (OPHTHALMIC) ×1 IMPLANT
CANNULA VLV SOFT TIP 25GA (OPHTHALMIC) ×3 IMPLANT
CORDS BIPOLAR (ELECTRODE) ×2 IMPLANT
COVER MAYO STAND STRL (DRAPES) ×2 IMPLANT
COVER WAND RF STERILE (DRAPES) ×3 IMPLANT
DRAPE INCISE 51X51 W/FILM STRL (DRAPES) IMPLANT
DRAPE OPHTHALMIC 77X100 STRL (CUSTOM PROCEDURE TRAY) ×3 IMPLANT
FILTER BLUE MILLIPORE (MISCELLANEOUS) IMPLANT
FILTER STRAW FLUID ASPIR (MISCELLANEOUS) ×4 IMPLANT
FORCEPS ECKARDT ILM 25G SERR (OPHTHALMIC RELATED) IMPLANT
FORCEPS GRIESHABER ILM 25G A (INSTRUMENTS) IMPLANT
FORCEPS HORIZONTAL 25G DISP (OPHTHALMIC RELATED) IMPLANT
GAS AUTO FILL CONSTEL (OPHTHALMIC)
GAS AUTO FILL CONSTELLATION (OPHTHALMIC) IMPLANT
GLOVE SS BIOGEL STRL SZ 8.5 (GLOVE) ×2 IMPLANT
GLOVE SUPERSENSE BIOGEL SZ 8.5 (GLOVE) ×1
GOWN STRL REUS W/ TWL LRG LVL3 (GOWN DISPOSABLE) ×2 IMPLANT
GOWN STRL REUS W/ TWL XL LVL3 (GOWN DISPOSABLE) ×2 IMPLANT
GOWN STRL REUS W/TWL LRG LVL3 (GOWN DISPOSABLE) ×3
GOWN STRL REUS W/TWL XL LVL3 (GOWN DISPOSABLE) ×3
KIT BASIN OR (CUSTOM PROCEDURE TRAY) ×3 IMPLANT
KNIFE CRESCENT 2.5 55 ANG (BLADE) IMPLANT
LENS BIOM SUPER VIEW SET DISP (OPHTHALMIC RELATED) ×3 IMPLANT
MICROPICK 25G (MISCELLANEOUS)
NDL 18GX1X1/2 (RX/OR ONLY) (NEEDLE) IMPLANT
NDL 25GX 5/8IN NON SAFETY (NEEDLE) IMPLANT
NDL FILTER BLUNT 18X1 1/2 (NEEDLE) IMPLANT
NDL HYPO 25GX1X1/2 BEV (NEEDLE) IMPLANT
NEEDLE 18GX1X1/2 (RX/OR ONLY) (NEEDLE) ×3 IMPLANT
NEEDLE 25GX 5/8IN NON SAFETY (NEEDLE) IMPLANT
NEEDLE FILTER BLUNT 18X 1/2SAF (NEEDLE) ×2
NEEDLE FILTER BLUNT 18X1 1/2 (NEEDLE) ×4 IMPLANT
NEEDLE HYPO 25GX1X1/2 BEV (NEEDLE) ×3 IMPLANT
NS IRRIG 1000ML POUR BTL (IV SOLUTION) ×3 IMPLANT
PACK FRAGMATOME (OPHTHALMIC) IMPLANT
PACK VITRECTOMY CUSTOM (CUSTOM PROCEDURE TRAY) ×3 IMPLANT
PAD ARMBOARD 7.5X6 YLW CONV (MISCELLANEOUS) ×6 IMPLANT
PAK PIK VITRECTOMY CVS 25GA (OPHTHALMIC) ×3 IMPLANT
PENCIL BIPOLAR 25GA STR DISP (OPHTHALMIC RELATED) IMPLANT
PICK MICROPICK 25G (MISCELLANEOUS) IMPLANT
PROBE LASER ILLUM FLEX CVD 25G (OPHTHALMIC) IMPLANT
ROLLS DENTAL (MISCELLANEOUS) IMPLANT
SCRAPER DIAMOND 25GA (OPHTHALMIC RELATED) IMPLANT
SET INJECTOR OIL FLUID CONSTEL (OPHTHALMIC) IMPLANT
STOCKINETTE IMPERVIOUS 9X36 MD (GAUZE/BANDAGES/DRESSINGS) ×6 IMPLANT
STOPCOCK 4 WAY LG BORE MALE ST (IV SETS) IMPLANT
SUT ETHILON 10 0 CS140 6 (SUTURE) IMPLANT
SUT ETHILON 8 0 BV130 4 (SUTURE) IMPLANT
SUT MERSILENE 5 0 RD 1 DA (SUTURE) IMPLANT
SUT PROLENE 10 0 CIF 4 DA (SUTURE) IMPLANT
SUT VICRYL 7 0 TG140 8 (SUTURE) IMPLANT
SYR 30ML SLIP (SYRINGE) IMPLANT
SYR 5ML LL (SYRINGE) IMPLANT
SYR TB 1ML LUER SLIP (SYRINGE) ×8 IMPLANT
WATER STERILE IRR 1000ML POUR (IV SOLUTION) ×3 IMPLANT
WIPE INSTRUMENT VISIWIPE 73X73 (MISCELLANEOUS) IMPLANT

## 2018-08-26 NOTE — Anesthesia Preprocedure Evaluation (Addendum)
Anesthesia Evaluation  Patient identified by MRN, date of birth, ID band Patient awake    Reviewed: Allergy & Precautions, H&P , NPO status , Patient's Chart, lab work & pertinent test results  Airway Mallampati: II  TM Distance: >3 FB Neck ROM: Full    Dental no notable dental hx. (+) Teeth Intact, Dental Advisory Given   Pulmonary sleep apnea , former smoker,    Pulmonary exam normal breath sounds clear to auscultation       Cardiovascular hypertension, Pt. on home beta blockers and Pt. on medications + Past MI, + Cardiac Stents and +CHF   Rhythm:Regular Rate:Normal  Echo 12/11/2016 Left ventricle: The cavity size was normal. Wall thickness was increased in a pattern of mild LVH. Systolic function was normal. The estimated ejection fraction was in the range of 50% to 55%. Wall motion was normal; there were no regional wall motion  abnormalities. - Left atrium: The atrium was mildly dilated.  Impressions: - Low normal to mildly reduced LV systolic function; mild LVH; mild LAE.     LHC 12/10/2016: 1. Severe one-vessel coronary artery disease with thrombotic plaque rupture in the proximal LAD at the bifurcation of a small diagonal branch which had severe ostial disease. The second diagonal has moderate diffuse disease. 2. Moderately reduced LV systolic function with an EF of 40% with global hypokinesis. Normal LVEDP. 3. Successful angioplasty and drug-eluting stent placement to the proximal LAD. The first diagonal was jailed by the stent with loss of flow. However, the patient had no chest pain or EKG changes. Given that the vessel was diseased to start with and relatively small in diameter, I elected not to rescue this.   Neuro/Psych Seizures -,  PSYCHIATRIC DISORDERS Depression    GI/Hepatic negative GI ROS, Neg liver ROS,   Endo/Other  diabetes, Type 2, Insulin Dependent, Oral Hypoglycemic Agents  Renal/GU negative Renal ROS      Musculoskeletal   Abdominal   Peds  Hematology negative hematology ROS (+)   Anesthesia Other Findings   Reproductive/Obstetrics negative OB ROS                           Lab Results  Component Value Date   WBC 13.0 (H) 08/23/2018   HGB 14.4 08/23/2018   HCT 44.1 08/23/2018   MCV 87.8 08/23/2018   PLT 297 08/23/2018   Lab Results  Component Value Date   CREATININE 1.04 (H) 08/23/2018   BUN 25 (H) 08/23/2018   NA 137 08/23/2018   K 3.9 08/23/2018   CL 106 08/23/2018   CO2 21 (L) 08/23/2018   Lab Results  Component Value Date   INR 0.87 02/11/2018   INR 1.08 12/10/2016   INR 0.99 09/21/2010   Echo: - Left ventricle: The cavity size was normal. Wall thickness was   increased in a pattern of mild LVH. Systolic function was normal.   The estimated ejection fraction was in the range of 50% to 55%.   Wall motion was normal; there were no regional wall motion   abnormalities. - Left atrium: The atrium was mildly dilated.  Anesthesia Physical  Anesthesia Plan  ASA: III and emergent  Anesthesia Plan: MAC   Post-op Pain Management:    Induction: Intravenous  PONV Risk Score and Plan: 4 or greater and Ondansetron, Midazolam and Treatment may vary due to age or medical condition  Airway Management Planned: Simple Face Mask  Additional Equipment: None  Intra-op Plan:  Post-operative Plan:   Informed Consent: I have reviewed the patients History and Physical, chart, labs and discussed the procedure including the risks, benefits and alternatives for the proposed anesthesia with the patient or authorized representative who has indicated his/her understanding and acceptance.   Dental advisory given  Plan Discussed with: CRNA  Anesthesia Plan Comments:         Anesthesia Quick Evaluation

## 2018-08-26 NOTE — H&P (Signed)
This 62 year old woman who is now 3 days status post vitrectomy, removal of posterior synechia, and injection of intravitreal antibiotics and vitreous tap. On that date patient had successful delivery of the antibiotics that empirically he did endophthalmitis. Subsequent culture results reported earlier today confirmed an organism of Streptococcus parasanguinous, and organism with a known biofilm production.  Biofilm can Kuester the organism in a nonvascular space so as to make it to relatively easily impervious to a antibiotic although it sensitive to it.  This might explain why there is ongoing fibrin reaction and dense vitreous debris in this left eye graft patient is thus admitted as an outpatient for outpatient surgery to include vitrectomy, diagnostic vitreous tap, and instillation of intravitreal antibiotics again to debulk the inflammatory condition and hopefully preserve as much visual function as possible. New. Patient says that this procedure be carried under local Mac anesthesia. Risk and benefits reviewed

## 2018-08-26 NOTE — Op Note (Signed)
Preoperative diagnosis  Chronic ongoing endophthalmitis left eye, Streptococcus   paraSanguinous  Postoperative diagnosis same  Procedure  Posterior vitrectomy, diagnostic vitreous tap, injection intravitreal antibiotics, vancomycin 1.0 mg/0.1 mL and Fortaz 2.25 mg / 0.1 mL  Surgeon   Hurman Horn M.D.,  Anesthesia, local Mac with retrobulbar left eye   Indication for procedure patient is 67 a woman with  With streptococcal para sanguinous endophthalmitis. This organism is peculiar in that it has a strong biofilm formation which could render it relatively resistant to antibiotics to which it is otherwise sensitive. Patient said recurrent and reformation of the vitritis despite vitrectomy and intraocular vitreal anabiotic injection some 4 days prior.  Thus this is attempt to debulk the inflammatory debris, reculture the eye for lingering chronic indolent endophthalmitis as well as to deliver intravitreal antibiotics again.  Patient says risk and benefits of the procedure and wished to proceed.  At appropriate signed consent was obtained patient taken to the operating. Discussion procedure in detail.  In the operating room the operative site was identified with staff and surgeon thereafter no mild sedation 2% Xylocaine injected retrobulbar with additional 5 mL laterally fashion modified Kirk Ruths. [Region sterilely prepped draped usual fashion. The lid speculum applied 25-gauge trocar placed in the inferotemporal paracentesis incision the cornea so as to clear the media from the anterior chamber proceeding posteriorly. A second paracentesis with a 25-gauge trocar was then made. Under low effusion pressures 20 mm I was able to clear the anterior chamber debris and the thereafter I placed super oh nasal scar to me through the pars plana and now adjacent vitrectomy probe from the posterior aspect further debulk the anterior vitreous inflammatory debris. The intrachamber deepening thus I did  create a small peripheral capsulotomy axis. This allowed for free flow of the anterior chamber infusion.to the posterior chamber. Infusions were pressures were kept on the low setting so as to prevent deformation of the angle. One of the trocar son the paracentesis was now removed and placed into the superotemporal pars plana. Classic vitrectomy bimanual then carried out posteriorly of. I was able to then visualized posteriorly and see the rare large clumps and a biofilm type of aggregation of white cells and fibrin overlying the macular optic nerve and the retinal vascular tear. I did not in any way get close to these areas. I used active instrumentation reflux to mobilize this preretinal debris. Large clumps this became mobile and was in the easily aspirated. There is nonetheless still white cell margination of from the retinal last suture. Peripheral retina was inspected and there was some peripheral Vitreous haze nasally on elected not to shave this area.  At her hyaloid was confirmed be removed. No comp cases occurred. The superior trochars removed the pars plana. Low effusion pressures maintain. I now removed the paracentesis incision with the infusion. Remained formed and the wounds are secure. At this time I delivered 0.1 mL of Fortaz 2.25 mg total into the pars plana followed by vancomycin 1.0 mg graft some remnants of these anabiotic's were injected in the inferonasal quadrant away from the paracentesis and sclerotomies.. Sterile patch and Fox shield applied. Patient taken the PACU in good stable condition. Dr. Zadie Rhine

## 2018-08-26 NOTE — Transfer of Care (Signed)
Immediate Anesthesia Transfer of Care Note  Patient: Sally Hendrix  Procedure(s) Performed: PARS PLANA VITRECTOMY 25 GAUGE FOR ENDOPHTHALMITIS, diagnostic vitreous tap (Left Eye)  Patient Location: PACU  Anesthesia Type:MAC  Level of Consciousness: awake, alert , oriented and patient cooperative  Airway & Oxygen Therapy: Patient Spontanous Breathing  Post-op Assessment: Report given to RN, Post -op Vital signs reviewed and stable and Patient moving all extremities X 4  Post vital signs: Reviewed and stable  Last Vitals:  Vitals Value Taken Time  BP 175/93 08/26/2018  9:14 PM  Temp    Pulse 81 08/26/2018  9:15 PM  Resp 18 08/26/2018  9:15 PM  SpO2 97 % 08/26/2018  9:15 PM  Vitals shown include unvalidated device data.  Last Pain:  Vitals:   08/26/18 1823  TempSrc: Oral  PainSc: 0-No pain         Complications: No apparent anesthesia complications

## 2018-08-26 NOTE — Brief Op Note (Signed)
Preoperative diagnosis  Chronic ongoing endophthalmitis left eye, Streptococcus   paraSanguinous  Postoperative diagnosis same  Procedure  Posterior vitrectomy, diagnostic vitreous tap, injection intravitreal antibiotics, vancomycin 1.0 mg/0.1 mL and Fortaz 2.25 mg / 0.1 mL  Surgeon   Hurman Horn M.D.,  Anesthesia, local Mac with retrobulbar left eye

## 2018-08-26 NOTE — Anesthesia Procedure Notes (Signed)
Performed by: Tamora Huneke J, CRNA ° ° ° ° ° ° °

## 2018-08-27 ENCOUNTER — Encounter (HOSPITAL_COMMUNITY): Payer: Self-pay | Admitting: Ophthalmology

## 2018-08-27 NOTE — Anesthesia Postprocedure Evaluation (Signed)
Anesthesia Post Note  Patient: Sally Hendrix  Procedure(s) Performed: PARS PLANA VITRECTOMY 25 GAUGE FOR ENDOPHTHALMITIS, diagnostic vitreous tap (Left Eye)     Patient location during evaluation: PACU Anesthesia Type: MAC Level of consciousness: awake and alert Pain management: pain level controlled Vital Signs Assessment: post-procedure vital signs reviewed and stable Respiratory status: spontaneous breathing Cardiovascular status: stable Anesthetic complications: no    Last Vitals:  Vitals:   08/26/18 2130 08/26/18 2145  BP: (!) 175/99 (!) 179/94  Pulse: 78 74  Resp: 17 20  Temp:  36.8 C  SpO2: 97% 100%    Last Pain:  Vitals:   08/26/18 2145  TempSrc:   PainSc: 0-No pain                 Nolon Nations

## 2018-08-29 LAB — EYE CULTURE: Culture: NO GROWTH

## 2018-08-29 LAB — ANAEROBIC CULTURE

## 2018-08-31 LAB — ANAEROBIC CULTURE

## 2018-10-27 IMAGING — CT CT HEAD W/O CM
3 series · 15 of 47 positions shown, 18 images · non-contrast
Comparison: None.

CLINICAL DATA: Blurry vision.  Hypertension.

EXAM:
CT HEAD WITHOUT CONTRAST
TECHNIQUE: Contiguous axial images were obtained from the base of the skull
through the vertex without intravenous contrast.

[Series 3: head 5.0 h30s · axial · 0.41mm/px · z∈[-106,+19]mm · 9 of 30 slices shown, 12 images]
[im 3/30  brain]
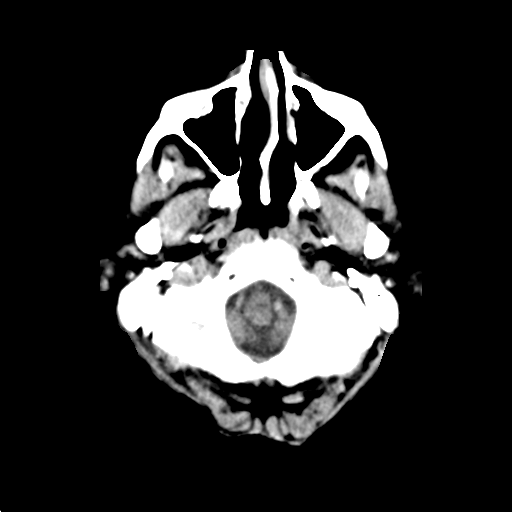
[im 3/30  bone]
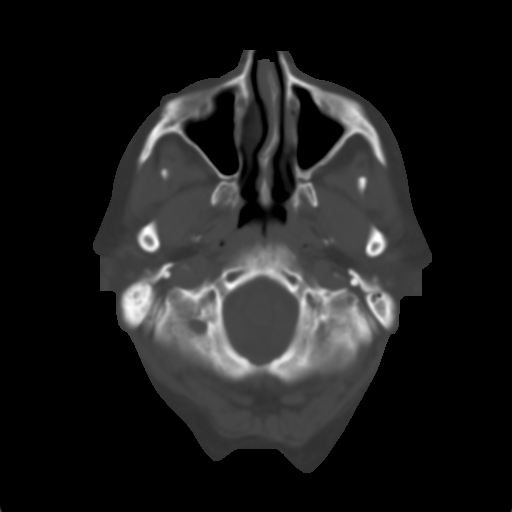
[im 6/30  brain]
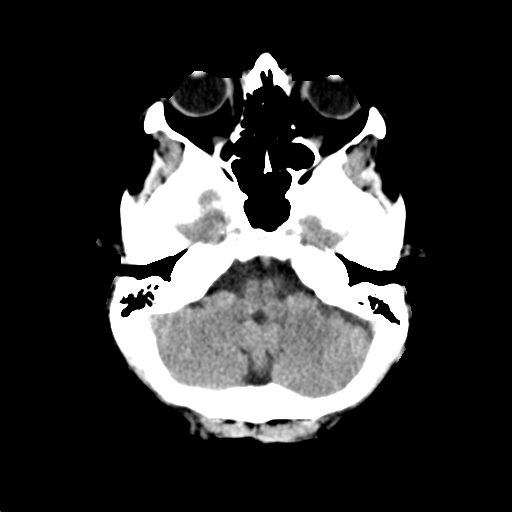
[im 9/30  brain]
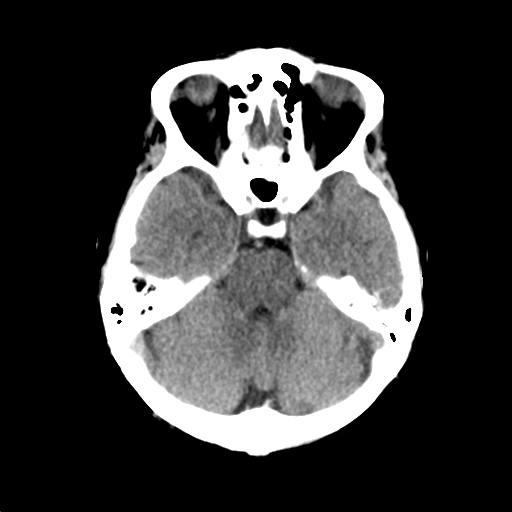
[im 12/30  brain]
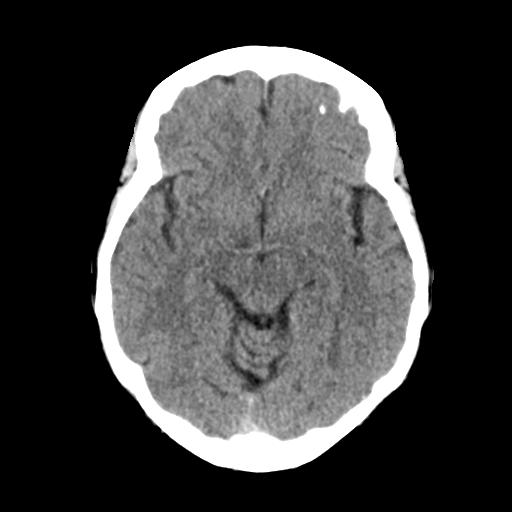
[im 16/30  brain]
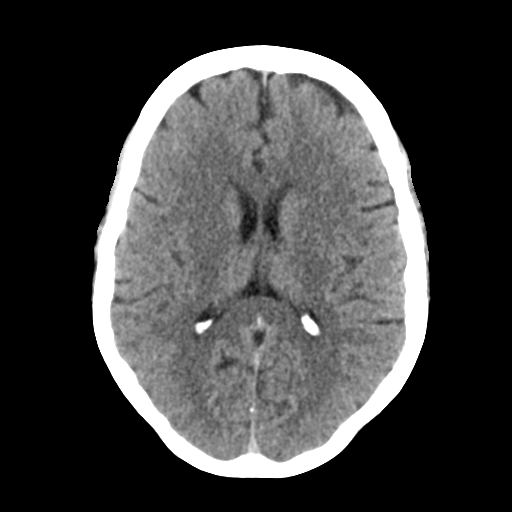
[im 16/30  bone]
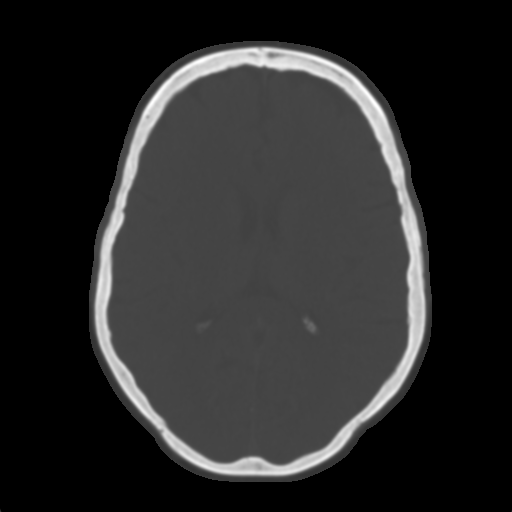
[im 19/30  brain]
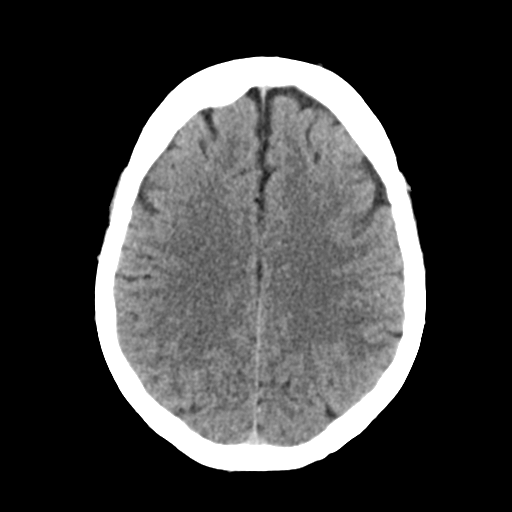
[im 22/30  brain]
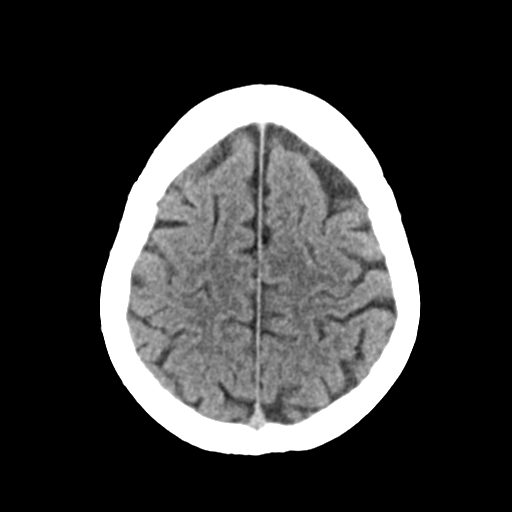
[im 25/30  brain]
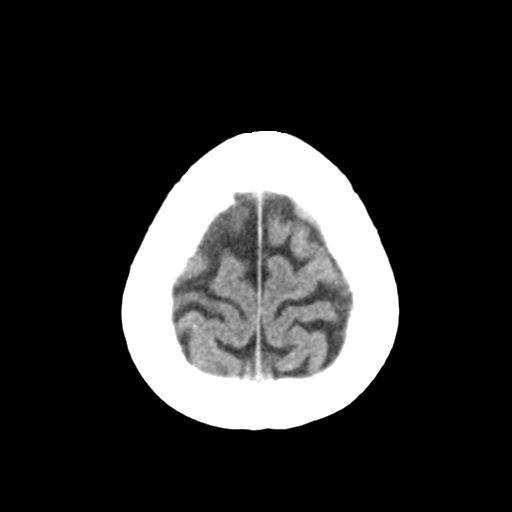
[im 28/30  brain]
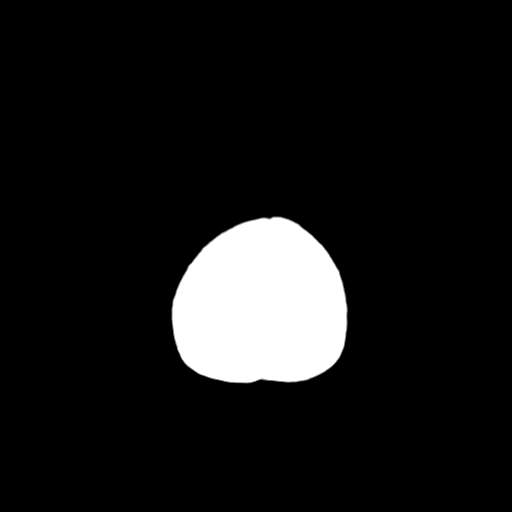
[im 28/30  bone]
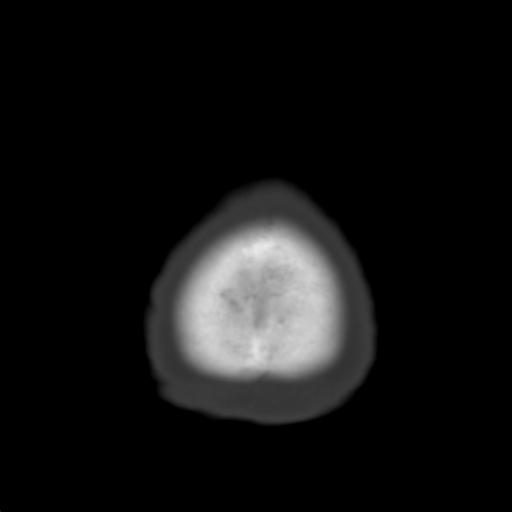

[Series 5: head 3.0 mpr cor · coronal · 0.29mm/px · 3 of 68 slices shown]
[im 23/68  brain]
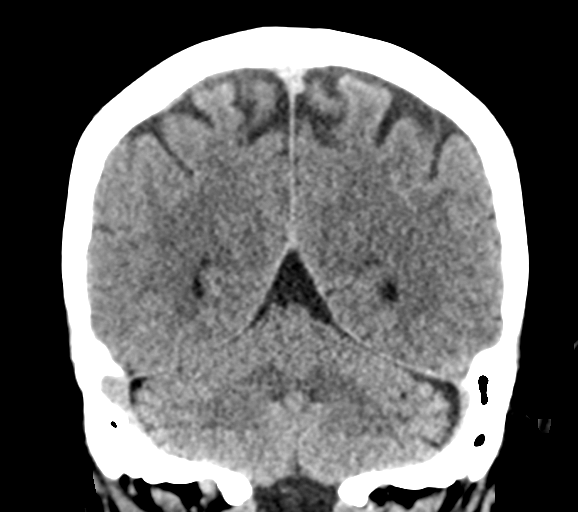
[im 30/68  brain]
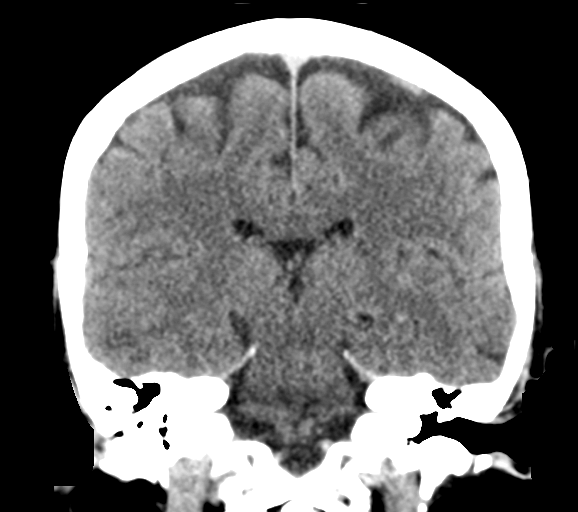
[im 38/68  brain]
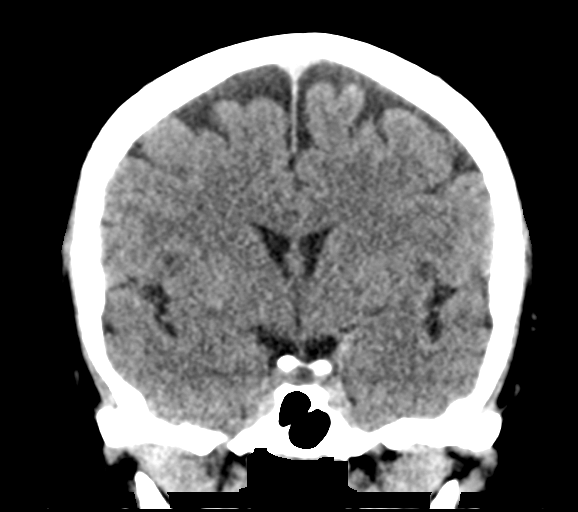

[Series 6: head 3.0 mpr sag · sagittal · 0.29mm/px · 3 of 55 slices shown]
[im 19/55  brain]
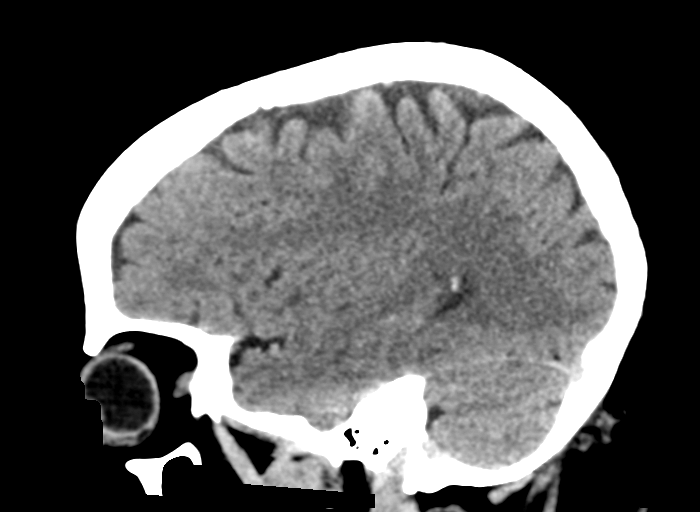
[im 28/55  brain]
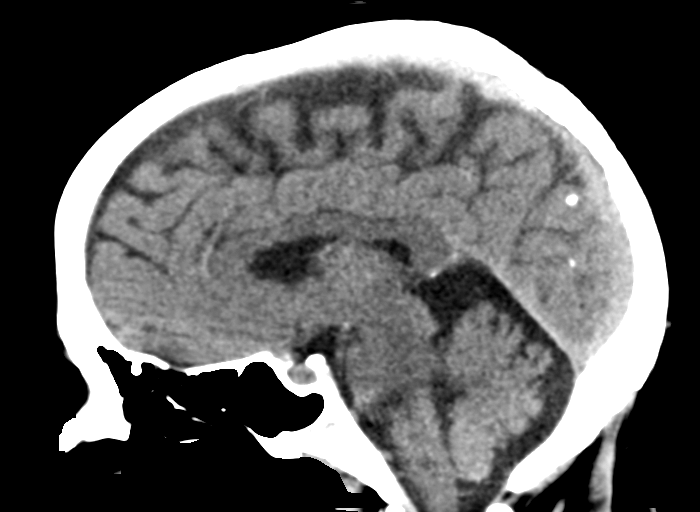
[im 37/55  brain]
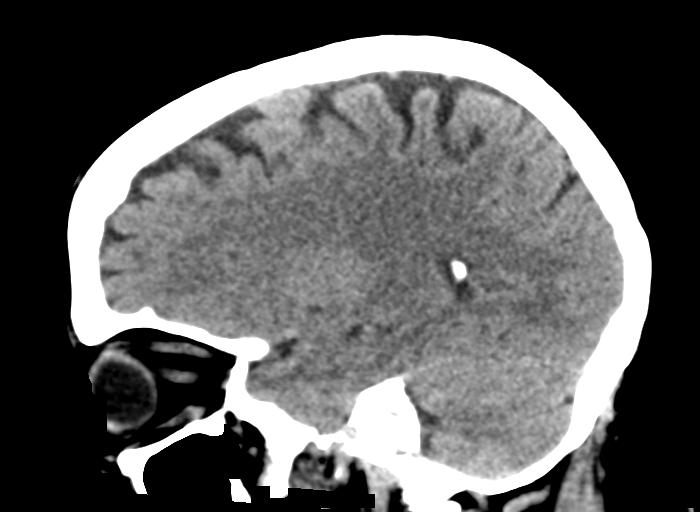

[15 of 47 positions shown; findings below may reference images not displayed]

FINDINGS: Brain: No evidence of acute infarction, hemorrhage, hydrocephalus,
or extra-axial collection. There is a 1.9 cm dural based slightly
hyperdense mass along the high anterior left frontal convexity
without mass effect on the underlying left superior frontal gyrus.

Vascular: Atherosclerotic vascular calcification of the carotid
siphons. No hyperdense vessel.

Skull: Normal. Negative for fracture or focal lesion.

Sinuses/Orbits: Mild mucosal thickening of the bilateral ethmoid air
cells and maxillary sinuses. Small retention cyst in the right
maxillary sinus. Partial opacification of the bilateral mastoid air
cells. The orbits are unremarkable.

Other: None.
IMPRESSION: 1.  No acute intracranial abnormality.
2. 1.9 cm dural based slightly hyperdense mass along the high
anterior left frontal convexity, likely a meningioma. No adjacent
mass effect.

## 2019-01-16 IMAGING — RF DG FLUORO GUIDE LUMBAR PUNCTURE
1 series · 1 of 1 positions shown · non-contrast
Comparison: none

CLINICAL DATA: Fever and mental status changes.

[Series 1: cp_standard · 0.19mm/px · 1 of 1 slices shown]
[im 1/1]
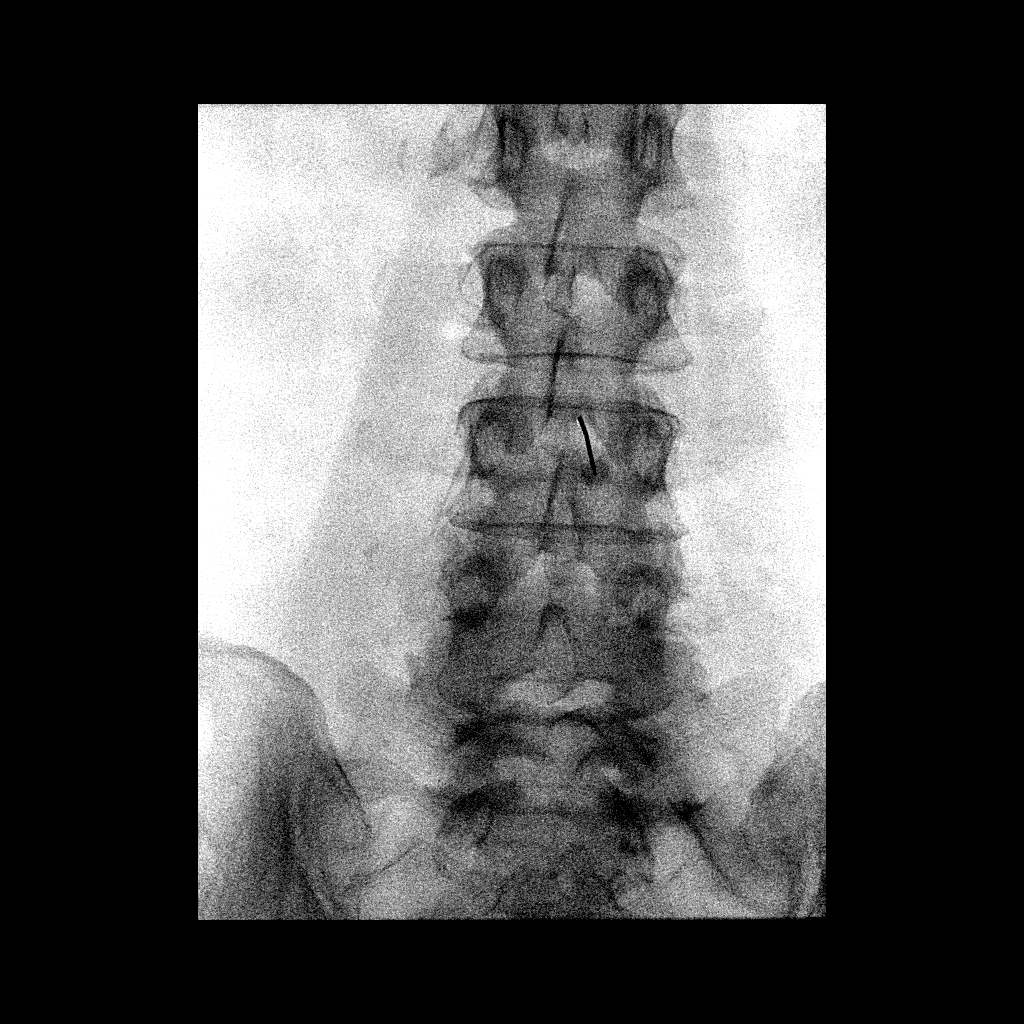

[1 of 1 positions shown; findings below may reference images not displayed]

EXAM:
DIAGNOSTIC LUMBAR PUNCTURE UNDER FLUOROSCOPIC GUIDANCE

FLUOROSCOPY TIME:  Fluoroscopy Time:  0 minutes and 36 seconds

Radiation Exposure Index (if provided by the fluoroscopic device):
3.1 mGy

Number of Acquired Spot Images: 0

PROCEDURE:
Informed consent was obtained from the patient prior to the
procedure, including potential complications of headache, allergy,
and pain. With the patient prone, the lower back was prepped with
Betadine. 1% Lidocaine was used for local anesthesia. Lumbar
puncture was performed at the L3-4 level using a 20 gauge needle
with return of clear CSF with an opening pressure of 20 cm water. 9
ml of CSF were obtained for laboratory studies. The patient
tolerated the procedure well and there were no apparent
complications.
IMPRESSION: Fluoroscopic guided lumbar puncture with opening pressure of 20 cc
H2O.

9 cc clear CSF obtained for appropriate laboratory evaluation.

The patient tolerated the procedure well without immediate
complications.

## 2020-01-23 ENCOUNTER — Emergency Department (HOSPITAL_COMMUNITY)
Admission: EM | Admit: 2020-01-23 | Discharge: 2020-01-23 | Disposition: A | Discharge status: Home / Self Care | Payer: Self-pay | Attending: Emergency Medicine | Admitting: Emergency Medicine

## 2020-01-23 ENCOUNTER — Encounter (HOSPITAL_COMMUNITY): Payer: Self-pay | Admitting: Emergency Medicine

## 2020-01-23 ENCOUNTER — Other Ambulatory Visit: Payer: Self-pay

## 2020-01-23 DIAGNOSIS — Z5321 Procedure and treatment not carried out due to patient leaving prior to being seen by health care provider: Secondary | ICD-10-CM | POA: Insufficient documentation

## 2020-01-23 DIAGNOSIS — R509 Fever, unspecified: Secondary | ICD-10-CM | POA: Insufficient documentation

## 2020-01-23 DIAGNOSIS — R1110 Vomiting, unspecified: Secondary | ICD-10-CM | POA: Insufficient documentation

## 2020-01-23 LAB — COMPREHENSIVE METABOLIC PANEL
ALT: 80 U/L — ABNORMAL HIGH (ref 0–44)
AST: 95 U/L — ABNORMAL HIGH (ref 15–41)
Albumin: 3 g/dL — ABNORMAL LOW (ref 3.5–5.0)
Alkaline Phosphatase: 149 U/L — ABNORMAL HIGH (ref 38–126)
Anion gap: 12 (ref 5–15)
BUN: 29 mg/dL — ABNORMAL HIGH (ref 8–23)
CO2: 24 mmol/L (ref 22–32)
Calcium: 8.4 mg/dL — ABNORMAL LOW (ref 8.9–10.3)
Chloride: 98 mmol/L (ref 98–111)
Creatinine, Ser: 1.47 mg/dL — ABNORMAL HIGH (ref 0.44–1.00)
GFR calc Af Amer: 43 mL/min — ABNORMAL LOW (ref >60)
GFR calc non Af Amer: 37 mL/min — ABNORMAL LOW (ref >60)
Glucose, Bld: 237 mg/dL — ABNORMAL HIGH (ref 70–99)
Potassium: 3.9 mmol/L (ref 3.5–5.1)
Sodium: 134 mmol/L — ABNORMAL LOW (ref 135–145)
Total Bilirubin: 1.1 mg/dL (ref 0.3–1.2)
Total Protein: 6.4 g/dL — ABNORMAL LOW (ref 6.5–8.1)

## 2020-01-23 LAB — CBC WITH DIFFERENTIAL/PLATELET
Abs Immature Granulocytes: 0.14 10*3/uL — ABNORMAL HIGH (ref 0.00–0.07)
Basophils Absolute: 0.1 10*3/uL (ref 0.0–0.1)
Basophils Relative: 1 %
Eosinophils Absolute: 0 10*3/uL (ref 0.0–0.5)
Eosinophils Relative: 0 %
HCT: 46.4 % — ABNORMAL HIGH (ref 36.0–46.0)
Hemoglobin: 15.6 g/dL — ABNORMAL HIGH (ref 12.0–15.0)
Immature Granulocytes: 1 %
Lymphocytes Relative: 7 %
Lymphs Abs: 0.7 10*3/uL (ref 0.7–4.0)
MCH: 29 pg (ref 26.0–34.0)
MCHC: 33.6 g/dL (ref 30.0–36.0)
MCV: 86.2 fL (ref 80.0–100.0)
Monocytes Absolute: 0.1 10*3/uL (ref 0.1–1.0)
Monocytes Relative: 1 %
Neutro Abs: 8.8 10*3/uL — ABNORMAL HIGH (ref 1.7–7.7)
Neutrophils Relative %: 90 %
Platelets: 78 10*3/uL — ABNORMAL LOW (ref 150–400)
RBC: 5.38 MIL/uL — ABNORMAL HIGH (ref 3.87–5.11)
RDW: 13.5 % (ref 11.5–15.5)
WBC: 9.8 10*3/uL (ref 4.0–10.5)
nRBC: 0 % (ref 0.0–0.2)

## 2020-01-23 LAB — LACTIC ACID, PLASMA: Lactic Acid, Venous: 1.7 mmol/L (ref 0.5–1.9)

## 2020-01-23 NOTE — ED Triage Notes (Signed)
Pt arrives to ED with a c/o of fevers nausea and vomiting for the past 2 days. Pt has pain all over more like generalized body aches. Pt states she is diabetic  and has spot of her right big toe and concerned this could be causing some sort of infection. Pt is also blind in left eye due to multiple surgeries last one was over 1 year ago but still having pain in this eye.

## 2020-02-15 ENCOUNTER — Encounter (INDEPENDENT_AMBULATORY_CARE_PROVIDER_SITE_OTHER): Payer: Self-pay | Admitting: Ophthalmology

## 2020-05-07 DIAGNOSIS — H3561 Retinal hemorrhage, right eye: Secondary | ICD-10-CM | POA: Diagnosis not present

## 2020-05-07 DIAGNOSIS — H3582 Retinal ischemia: Secondary | ICD-10-CM | POA: Diagnosis not present

## 2020-05-07 DIAGNOSIS — E113311 Type 2 diabetes mellitus with moderate nonproliferative diabetic retinopathy with macular edema, right eye: Secondary | ICD-10-CM | POA: Diagnosis not present

## 2020-05-07 DIAGNOSIS — H3412 Central retinal artery occlusion, left eye: Secondary | ICD-10-CM | POA: Diagnosis not present

## 2020-05-18 DIAGNOSIS — E113311 Type 2 diabetes mellitus with moderate nonproliferative diabetic retinopathy with macular edema, right eye: Secondary | ICD-10-CM | POA: Diagnosis not present

## 2020-05-18 DIAGNOSIS — H3582 Retinal ischemia: Secondary | ICD-10-CM | POA: Diagnosis not present

## 2020-05-24 ENCOUNTER — Ambulatory Visit: Payer: Self-pay | Admitting: *Deleted

## 2020-05-28 DIAGNOSIS — E113311 Type 2 diabetes mellitus with moderate nonproliferative diabetic retinopathy with macular edema, right eye: Secondary | ICD-10-CM | POA: Diagnosis not present

## 2020-05-30 ENCOUNTER — Other Ambulatory Visit: Payer: Self-pay | Admitting: *Deleted

## 2020-05-30 NOTE — Patient Outreach (Signed)
  [facility]) Care Management Chronic Special Needs Program    05/30/2020  Name: JONAI WEYLAND, DOB: [birth year]  MRN: [id number]   Ms. Karen Huhta is enrolled in a chronic special needs plan for Diabetes.  Outreach call to client for initial telephone outreach, no answer to telephone, left voicemail requesting return phone call.  PLAN Outreach client in 1-2 weeks  Jacqlyn Larsen [facility], BSN [place], [place]

## 2020-05-31 ENCOUNTER — Encounter: Payer: Self-pay | Admitting: *Deleted

## 2020-05-31 ENCOUNTER — Other Ambulatory Visit: Payer: Self-pay | Admitting: *Deleted

## 2020-05-31 NOTE — Patient Outreach (Signed)
[facility]ally [facility]) Care Management Chronic Special Needs Program  05/31/2020  Name: Sally Hendrix DOB: 1956/06/03  MRN: [id number]  Sally Hendrix is enrolled in a chronic special needs plan for Diabetes. Chronic Care Management Coordinator telephoned client to review health risk assessment and to develop individualized care plan.  Introduced the chronic care management program, importance of client participation, and taking their care plan to all provider appointments and inpatient facilities.  Reviewed the transition of care process and possible referral to community care management.  Subjective: Outreach call to client for initial telephone assessment, client reports she lives with her spouse, 65 year old daughter and granddaughter and she has adequate assistance if needed.  Client reports her vision in right eye is 20/30 and she is blind in left eye but is still able to drive, client reports she is getting an injection in her right eye in the near future, states she cannot afford lumigan and combigan eye drops and therefore is not using, client would like pharmacist to outreach her.  Client reports she weighs once weekly and does not intend to weigh daily.  Client reports she uses [employer] but has not checked [facility] in 1.5 weeks due to "ran out of supplies"  Client states she intends to call today and make appointment with primary care provider as she has not seen Dr. Philip Aspen since 11/10/18 due to " I just got insurance, now I can see the doctor and get the things I need"  Client reports she also needs to have blood work done and [place] [facility] completed.    Goals Addressed              This Visit's Progress   .   Acknowledge receipt of Met[employer] mailed client Advanced Directives packet. RN care manager reviewed importance of having Advanced Directives forms completed. Plan to have Advanced Directives (Living Will and POA) forms notarized and  witnessed. RN care manager mailed EMMI education article " Advanced directives"     .  Client understands the importance of follow-up with providers by attending scheduled visits        Client last visit with primary care provider 11/10/18 Please call as soon as possible to scheduled an appointment with your provider    .  Client verbalize knowledge of Heart Failure disease self management skills by 6 months        Signs and symptoms of heart failure reviewed.  Client verbalizes understanding. Review [employer] calendar (in the back section) sent in the mail for Heart failure information/ action plan. Read the "color" heart zones and know if you are in the red, yellow or green zones and be prepared to discuss with RN on each call. Call your provider if you feel you are getting in the "yellow" zone. Call 911 if you are in the "red" zone, unrelieved shortness of breath. It is important to weigh daily and write down weights.  Pay attention to your body if you have any shortness of breath, swelling in your feet, ankles, legs or your waistband gets tight. Call your provider if you gain 3 pounds overnight or 5 pounds in a week. Take your medications as prescribed, many will cause you to use the bathroom more. Follow a low salt meal plan, read food labels for the sodium (salt) content. Your provider may restrict or limit how much liquids you drink every day.  Increase exercise  only if you are able to do it.  Follow doctor recommendations. EMMI education article provided "Heart Failure, Adult"  Review and plan to discuss with RN during next telephonic assessment.       .  Client verbalizes knowledge of Heart Attack self management skills by 6 months        Please call 911 for any symptoms of a heart attack.  Symptoms may include chest pain, pressure or tightness, pain that radiates to your jaw, neck or back, shortness of breath, nausea, indigestion, lightheadedness or sudden  dizziness. Follow a heart healthy diet (rich in fruits, vegetables, lean proteins and low in salt).  Talk with your doctor about exercising. Take medications as prescribed. Follow up with your health care provider as recommended. RN care manager provider EMMI education "Heart disease in diabetes" Use 24 hour nurse advice line as needed at [phone]    .  Client will use [employer] as needed and verbalize understanding of device use        Client reports she will contact primary care provider to obtain prescription for needed supplies for [place] [facility] It is very important that you check your blood sugar daily    .  Client will verbalize knowledge of self management of Hypertension as evidences by BP reading of 140/90 or less; or as defined by provider        Plan to check blood pressure regularly.  If you do not have a B/P monitor (cuff), one can be provided to you.  Write results in your [employer] calendar (in the back section). Reviewed blood pressure medication from EMR. Take B/P medications as ordered.  Some may cause you to use the bathroom more. Plan to eat low salt and heart healthy meals full of fruits, vegetables, whole grains, lean protein and limit fat and sugars. Increase activity as tolerated. Reviewed lifestyle modification- smoking cessation, weight control and reducing stress.     Marland Kitchen  HEMOGLOBIN A1C < 7 (pt-stated)        Your last documented AIC is 7.7.  Have your [facility] checked every 6 months if you are at goal or every 3 months if you are not at goal. Check blood sugars daily before eating with goal of 80-130.  You can also check 1 1/2 hours after eating with goal of 180 or less. Plan to eat low carbohydrate and low salt meals, watch portion sizes and avoid sugar sweetened drinks.  Discussed carbohydrate control meals. Reviewed signs and symptoms of hyperglycemia (high blood sugar) and hypoglycemia (low blood sugar) and actions to take. Review [employer] calendar (sent in the mail) for diabetes action plan in the back. Reviewed nutrition counseling benefit provided by [employer].   Increase activity only if you are able to do it.  Follow doctor recommendations. EMMI education provided on "Diabetes and diet" .  Review and plan to discuss with RN during next telephonic assessment.       .  Maintain timely refills of diabetic medication as prescribed within the year .        Contact your RN care manager if you have questions about medicines. Medication review completed from EMR information. It is important to take your medications as prescribed. Reviewed use and possible side effects of diabetes medication. RN care manager referred client to pharmacist for information and assistance- client reports unable to afford eye drops lumigan and [place].     .  Obtain annual  Lipid Profile,  LDL-C        Per medical record review, Lipid profile completed on 08/05/18 LDL= 160 The goal for LDL is less than 70mg /dl as you are at high risk for complications. Try to avoid saturated fats, trans-fats and eat more fiber. Plan to take statin (cholesterol) medicine as ordered.      [place]lla Level Annual Eye (retinal)  Exam    On track     Your last documented eye exam was on June 2021. Diabetes can affect your vision.  Plan to have a dilated eye exam every year. Advised client to keep and/ or schedule appointment with eye doctor. Continue to use your eye drops as prescribed.      .  Obtain Annual Foot Exam        Your doctor should check your bare feet at each visit. Diabetes can affect the nerves in your feet, causing decreased feeling or numbness. Check your feet and in-between toes daily for cuts, bruises, redness, blisters or sores.  If you cannot reach them, use a mirror. Wash feet with soap and water, dry feet well especially between toes.  Don't use too much lotion. Wear shoes that are not too tight and don't walk  barefoot.      .  Obtain annual screen for micro albuminuria (urine) , nephropathy (kidney problems)        Diabetes can affect your kidneys. It is important for your doctor to check your urine at least once a year  These tests show how your kidneys are working.     [place]lla Level Hemoglobin A1C at least 2 times per year        Please call health care provider and schedule to have needed labwork completed Last [facility]rix 11/10/18    .  Visit Primary Care Provider or Endocrinologist at least 2 times per year         Please call your health care provider and make an appointment Last documented visit with health care provider 11/10/18       Plan:    RN care manager faxed today's note with individualized care plan to primary care provider and mailed individualized care plan to client along with educational materials, consent form, advanced directives packet, HTA calendar, 24 hour nurse line magnet.  Chronic care management coordination will outreach in:  6 months  Will refer client to:  Pharmacy for medication assistance   [place] Case Manager, C-SNP  [phone]

## 2020-05-31 NOTE — Patient Outreach (Signed)
Received a [place] referral.  I posted a referral ticket in the [employer] portal.    Ticket saved successfully with Tracking ID: [id number]

## 2020-06-06 ENCOUNTER — Ambulatory Visit: Payer: Self-pay | Admitting: *Deleted

## 2020-06-22 DIAGNOSIS — M79671 Pain in right foot: Secondary | ICD-10-CM | POA: Diagnosis not present

## 2020-06-22 DIAGNOSIS — I119 Hypertensive heart disease without heart failure: Secondary | ICD-10-CM | POA: Diagnosis not present

## 2020-06-22 DIAGNOSIS — E1129 Type 2 diabetes mellitus with other diabetic kidney complication: Secondary | ICD-10-CM | POA: Diagnosis not present

## 2020-06-22 DIAGNOSIS — L97514 Non-pressure chronic ulcer of other part of right foot with necrosis of bone: Secondary | ICD-10-CM | POA: Diagnosis not present

## 2020-06-22 DIAGNOSIS — I2510 Atherosclerotic heart disease of native coronary artery without angina pectoris: Secondary | ICD-10-CM | POA: Diagnosis not present

## 2020-06-22 DIAGNOSIS — E0842 Diabetes mellitus due to underlying condition with diabetic polyneuropathy: Secondary | ICD-10-CM | POA: Diagnosis not present

## 2020-06-22 DIAGNOSIS — Z8616 Personal history of COVID-19: Secondary | ICD-10-CM | POA: Diagnosis not present

## 2020-06-22 DIAGNOSIS — E08621 Diabetes mellitus due to underlying condition with foot ulcer: Secondary | ICD-10-CM | POA: Diagnosis not present

## 2020-07-09 DIAGNOSIS — M79674 Pain in right toe(s): Secondary | ICD-10-CM | POA: Diagnosis not present

## 2020-07-17 DIAGNOSIS — M79671 Pain in right foot: Secondary | ICD-10-CM | POA: Diagnosis not present

## 2020-09-10 ENCOUNTER — Other Ambulatory Visit: Payer: Self-pay | Admitting: *Deleted

## 2020-09-10 NOTE — Patient Outreach (Signed)
  [facility]) Care Management Chronic Special Needs Program    09/10/2020  Name: Sally Hendrix, DOB: 23-Jan-1956  MRN: [id number]   [name] is enrolled in a chronic special needs plan for Diabetes.  [facility] care management will continue to provide services for this member through 10/05/20.  [facility] care management team will assume care 10/06/20.   Jacqlyn Larsen [facility], BSN [place], [place]

## 2020-10-10 ENCOUNTER — Other Ambulatory Visit: Payer: Self-pay | Admitting: *Deleted

## 2020-11-22 DIAGNOSIS — E785 Hyperlipidemia, unspecified: Secondary | ICD-10-CM | POA: Diagnosis not present

## 2020-11-22 DIAGNOSIS — E1129 Type 2 diabetes mellitus with other diabetic kidney complication: Secondary | ICD-10-CM | POA: Diagnosis not present

## 2021-02-28 DIAGNOSIS — Z794 Long term (current) use of insulin: Secondary | ICD-10-CM | POA: Diagnosis not present

## 2021-02-28 DIAGNOSIS — E11621 Type 2 diabetes mellitus with foot ulcer: Secondary | ICD-10-CM | POA: Diagnosis not present

## 2021-02-28 DIAGNOSIS — E785 Hyperlipidemia, unspecified: Secondary | ICD-10-CM | POA: Diagnosis not present

## 2021-02-28 DIAGNOSIS — Z0000 Encounter for general adult medical examination without abnormal findings: Secondary | ICD-10-CM | POA: Diagnosis not present

## 2021-02-28 DIAGNOSIS — E1151 Type 2 diabetes mellitus with diabetic peripheral angiopathy without gangrene: Secondary | ICD-10-CM | POA: Diagnosis not present

## 2021-02-28 DIAGNOSIS — L97512 Non-pressure chronic ulcer of other part of right foot with fat layer exposed: Secondary | ICD-10-CM | POA: Diagnosis not present

## 2021-02-28 DIAGNOSIS — I10 Essential (primary) hypertension: Secondary | ICD-10-CM | POA: Diagnosis not present

## 2021-02-28 DIAGNOSIS — K921 Melena: Secondary | ICD-10-CM | POA: Diagnosis not present

## 2021-02-28 DIAGNOSIS — I739 Peripheral vascular disease, unspecified: Secondary | ICD-10-CM | POA: Diagnosis not present

## 2021-02-28 DIAGNOSIS — I2510 Atherosclerotic heart disease of native coronary artery without angina pectoris: Secondary | ICD-10-CM | POA: Diagnosis not present

## 2021-02-28 LAB — IFOBT (OCCULT BLOOD): IFOBT: POSITIVE

## 2021-03-11 ENCOUNTER — Ambulatory Visit: Payer: HMO | Admitting: Orthopedic Surgery

## 2021-03-11 ENCOUNTER — Encounter: Payer: Self-pay | Admitting: Orthopedic Surgery

## 2021-03-11 ENCOUNTER — Other Ambulatory Visit: Payer: Self-pay

## 2021-03-11 DIAGNOSIS — M6701 Short Achilles tendon (acquired), right ankle: Secondary | ICD-10-CM | POA: Diagnosis not present

## 2021-03-11 DIAGNOSIS — E11621 Type 2 diabetes mellitus with foot ulcer: Secondary | ICD-10-CM | POA: Diagnosis not present

## 2021-03-11 DIAGNOSIS — L97519 Non-pressure chronic ulcer of other part of right foot with unspecified severity: Secondary | ICD-10-CM

## 2021-03-11 NOTE — Progress Notes (Signed)
Office Visit Note   Patient: Sally Hendrix           Date of Birth: 08-Oct-1955           MRN: [id number] Visit Date: 03/11/2021              Requested by: Leanna Battles, [place],  [place] [address] PCP: Leanna Battles, MD  Chief Complaint  Patient presents with  . Right Foot - Pain      HPI: Patient is a 66 year old woman with type 1 diabetes peripheral vascular disease hypertension history of tobacco use who was seen for initial evaluation from referral Dr. Sharlett Iles for an ulcer right foot great toe she is currently on doxycycline.  She states she has had the ulcer for over a year.  She denies any pain or drainage.  She states she has been on 2 antibiotics.  Assessment & Plan: Visit Diagnoses: No diagnosis found.  Plan: Ulcer was debrided of skin and soft tissue patient was given instructions for Achilles stretching and this was demonstrated.  Recommended a stiff soled shoe.  Discussed the possibility of a gastrocnemius recession.  Follow-Up Instructions: No follow-ups on file.   Ortho Exam  Patient is alert, oriented, no adenopathy, well-dressed, normal affect, normal respiratory effort. Examination patient has a good dorsalis pedis pulse she does have Achilles contracture with dorsiflexion of the ankle only to neutral she also has hallux rigidus with dorsiflexion of the great toe MTP joint 45 degrees.  Patient is currently wearing regular sandals.  After informed consent patient underwent debridement of the Wagner grade 1 ulcer right great toe plantar aspect of the IP joint.  Prior to debridement the ulcer is 3 mm in diameter after debridement the ulcer is 15 mm in diameter and 3 mm deep this did not probe to bone or tendon there is good granulation tissue and this was touched with silver nitrate a 10 blade knife was used for debridement.  Imaging: No results found. No images are attached to the encounter.  Labs: Lab Results  Component Value Date    HGBA1C 7.5 (H) 08/23/2018   HGBA1C 12.9 (H) 05/05/2018   HGBA1C 12.3 (H) 12/10/2016   REPTSTATUS 08/29/2018 FINAL 08/26/2018   REPTSTATUS 08/31/2018 FINAL 08/26/2018   GRAMSTAIN  08/26/2018    WBC PRESENT, PREDOMINANTLY PMN NO ORGANISMS SEEN CYTOSPIN SMEAR    CULT  08/26/2018    NO GROWTH Performed at [facility], [place] [address] [place], [place] [address]    CULT  08/26/2018    NO ANAEROBES ISOLATED Performed at [place] [address] [place], [place] [address]    [place] 08/23/2018     Lab Results  Component Value Date   ALBUMIN 3.0 (L) 01/23/2020   ALBUMIN 2.8 (L) 05/03/2018   ALBUMIN 3.0 (L) 05/02/2018    Lab Results  Component Value Date   MG 1.8 05/05/2018   MG 1.7 05/02/2018   No results found for: VD25OH  No results found for: PREALBUMIN CBC EXTENDED Latest Ref Rng & Units 01/23/2020 08/23/2018 05/05/2018  WBC 4.0 - 10.5 K/uL 9.8 13.0(H) 9.2  RBC 3.87 - 5.11 MIL/uL 5.38(H) 5.02 4.22  HGB 12.0 - 15.0 g/dL 15.6(H) 14.4 11.8(L)  HCT 36.0 - 46.0 % 46.4(H) 44.1 36.8  PLT 150 - 400 K/uL 78(L) 297 404(H)  NEUTROABS 1.7 - 7.7 K/uL 8.8(H) - -  LYMPHSABS 0.7 - 4.0 K/uL 0.7 - -  There is no height or weight on file to calculate BMI.  Orders:  No orders of the defined types were placed in this encounter.  No orders of the defined types were placed in this encounter.    Procedures: No procedures performed  Clinical Data: No additional findings.  ROS:  All other systems negative, except as noted in the HPI. Review of Systems  Objective: Vital Signs: There were no vitals taken for this visit.  Specialty Comments:  No specialty comments available.  PMFS History: Patient Active Problem List   Diagnosis Date Noted  . Endophthalmitis, acute, left 08/23/2018    Class: Present on Admission  . Sleep apnea, obstructive 08/23/2018  . Seizures ([place]) 05/04/2018  . Seizure ([place]) 05/03/2018  . Hypomagnesemia  05/03/2018  . Hyponatremia 05/03/2018  . Non-ST elevation (NSTEMI) myocardial infarction ([place])   . Chest pain 12/09/2016  . Uncontrolled type 2 diabetes mellitus with hyperglycemia ([place]) 12/09/2016  . Hypertensive urgency 12/09/2016  . Chronic systolic heart failure ([place]) 03/31/2011  . Essential hypertension 03/31/2011  . DM2 (diabetes mellitus, type 2) ([place]) 03/31/2011   Past Medical History:  Diagnosis Date  . Cervical cancer ([place]) 1980s  . CHF (congestive heart failure) ([place])   . Depression    "all my life" (05/03/2018)  . History of kidney stones   . Hypertension   . Myocardial infarction ([place]) 12/10/2016  . Nonischemic cardiomyopathy ([place])   . Obesity   . Seizure ([place]) 05/02/2018 X2-3  . Sleep apnea    "not dx'd" (05/03/2018)  . Type II diabetes mellitus ([facility])     Family History  Problem Relation Age of Onset  . Heart failure Mother        hx of - died of nonalcoholic cirrhosis  . Coronary artery disease Father 23       alive  . Other Brother        brain tumor    Past Surgical History:  Procedure Laterality Date  . CARDIAC CATHETERIZATION    . CATARACT EXTRACTION Bilateral   . CERVIX LESION DESTRUCTION  1980s   "for cancer"  . CORONARY STENT INTERVENTION N/A 12/10/2016   Procedure: Coronary Stent Intervention;  Surgeon: Wellington Hampshire, MD;  Location: [place] [facility];  Service: Cardiovascular;  Laterality: N/A;  . LEFT HEART CATH AND CORONARY ANGIOGRAPHY N/A 12/10/2016   Procedure: Left Heart Cath and Coronary Angiography;  Surgeon: Wellington Hampshire, MD;  Location: [place] [facility];  Service: Cardiovascular;  Laterality: N/A;  . PARS PLANA VITRECTOMY Left 08/23/2018   Procedure: PARS PLANA VITRECTOMY 25 GAUGE FOR ENDOPHTHALMITIS;  Surgeon: Hurman Horn, MD;  Location: [place];  Service: Ophthalmology;  Laterality: Left;  . PARS PLANA VITRECTOMY Left 08/26/2018   Procedure: PARS PLANA VITRECTOMY 25 GAUGE FOR ENDOPHTHALMITIS, diagnostic vitreous tap;  Surgeon:  Hurman Horn, MD;  Location: [place];  Service: Ophthalmology;  Laterality: Left;   Social History   Occupational History  . Occupation: Therapist, art: [employer]  Tobacco Use  . Smoking status: Former Smoker    Packs/day: 0.00    Years: 1.00    Pack years: 0.00    Types: Cigarettes    Quit date: 1980    Years since quitting: 42.4  . Smokeless tobacco: Never Used  . Tobacco comment: "smoked alot for 1 yr"  Vaping Use  . Vaping Use: Never used  Substance and Sexual Activity  . Alcohol use: Yes  Comment: 05/03/2018 "nothing in the last 10 yrs; take too much RX"  . Drug use: No  . Sexual activity: Not on file

## 2021-04-09 ENCOUNTER — Ambulatory Visit: Payer: HMO | Admitting: Orthopedic Surgery

## 2021-04-11 ENCOUNTER — Encounter: Payer: Self-pay | Admitting: Gastroenterology

## 2021-05-16 ENCOUNTER — Ambulatory Visit: Payer: HMO | Admitting: Gastroenterology

## 2021-05-16 ENCOUNTER — Telehealth: Payer: Self-pay

## 2021-05-16 ENCOUNTER — Encounter: Payer: Self-pay | Admitting: Gastroenterology

## 2021-05-16 VITALS — BP 194/100 | HR 112 | Ht 62.5 in | Wt 149.1 lb

## 2021-05-16 DIAGNOSIS — R195 Other fecal abnormalities: Secondary | ICD-10-CM | POA: Diagnosis not present

## 2021-05-16 DIAGNOSIS — Z955 Presence of coronary angioplasty implant and graft: Secondary | ICD-10-CM

## 2021-05-16 MED ORDER — SUTAB 1479-225-188 MG PO TABS: 1.0000 | ORAL_TABLET | Freq: Once | ORAL | 0 refills | Status: AC

## 2021-05-16 NOTE — Telephone Encounter (Signed)
   Sally Hendrix 07-24-1956 [id number]  Dear Dr.Paterson:  We have scheduled the above named patient for a(n) Colonoscopy  procedure. Our records show that (s)he is on anticoagulation therapy.  Please advise as to whether the patient may come off their therapy of Plavix 5 days prior to their procedure which is scheduled for 07/25/21.  Please route your response to [facility] or fax response to [phone].  Sincerely,    [facility]

## 2021-05-16 NOTE — Patient Instructions (Signed)
If you are age 66 or older, your body mass index should be between 23-30. Your Body mass index is 26.84 kg/m. If this is out of the aforementioned range listed, please consider follow up with your Primary Care Provider.  If you are age 16 or younger, your body mass index should be between 19-25. Your Body mass index is 26.84 kg/m. If this is out of the aformentioned range listed, please consider follow up with your Primary Care Provider.   You have been scheduled for a colonoscopy. Please follow written instructions given to you at your visit today.  Please pick up your prep supplies at the pharmacy within the next 1-3 days. If you use inhalers (even only as needed), please bring them with you on the day of your procedure.   __________________________________________________________  The [place] [facility] providers would like to encourage you to use [facility] to communicate with providers for non-urgent requests or questions.  Due to long hold times on the telephone, sending your provider a message by [facility] may be a faster and more efficient way to get a response.  Please allow 48 business hours for a response.  Please remember that this is for non-urgent requests.   Due to recent changes in healthcare laws, you may see the results of your imaging and laboratory studies on MyChart before your provider has had a chance to review them.  We understand that in some cases there may be results that are confusing or concerning to you. Not all laboratory results come back in the same time frame and the provider may be waiting for multiple results in order to interpret others.  Please give [place] 48 hours in order for your provider to thoroughly review all the results before contacting the office for clarification of your results.    It was a pleasure to see you today!  Thank you for trusting me with your gastrointestinal care!    Scott E. Candis Schatz ,MD

## 2021-05-16 NOTE — Progress Notes (Signed)
HPI : Sally Hendrix is a 65 year old female with remote history of CHF and coronary artery disease who was referred to [place] by Leanna Battles, MD because of a positive FIT test.  The patient states that she had been getting stool based screening test for the past 6 to 8 years which had been persistently negative.  This is the first positive test she has had.  She denies any overt blood in her stool.  She reports somewhat irregular bowel habits, sometimes dealing with constipation and sometimes frequent loose stools.  Abdominal pain is not a problem for her.  She denies any upper GI symptoms to include heartburn, acid regurgitation, nausea/vomiting or dysphagia.  Her weight has been stable. She has an aunt with colon cancer, but no other family history of colon cancer.  The patient has a history of CHF and NSTEMI with 1 stent placed 3 years ago.  She is currently on aspirin and Plavix for antiplatelet therapy.  She also has a history of an isolated seizure which occurred 4 years ago.  She has not had any recurrent seizures.   The patient denies any symptoms of chest pain/pressure dizziness.  She denies lower extremity edema or orthopnea.  She is active around the house and caring for her grandkids.  She can easily walk up 2 flights of stairs without stopping.   Past Medical History:  Diagnosis Date   Cervical cancer ([place]) 1980s   CHF (congestive heart failure) ([place])    Depression    "all my life" (05/03/2018)   Glaucoma    History of kidney stones    Hypertension    Myocardial infarction ([place]) 12/10/2016   Nonischemic cardiomyopathy ([place])    Obesity    Seizure ([place]) 05/02/2018 X2-3   Sleep apnea    "not dx'd" (05/03/2018)   Type II diabetes mellitus ([place])      Past Surgical History:  Procedure Laterality Date   CARDIAC CATHETERIZATION     CATARACT EXTRACTION Bilateral    CERVIX LESION DESTRUCTION  1980s   "for cancer"   CORONARY STENT INTERVENTION N/A 12/10/2016   Procedure: Coronary  Stent Intervention;  Surgeon: Wellington Hampshire, MD;  Location: [place] [facility];  Service: Cardiovascular;  Laterality: N/A;   LEFT HEART CATH AND CORONARY ANGIOGRAPHY N/A 12/10/2016   Procedure: Left Heart Cath and Coronary Angiography;  Surgeon: Wellington Hampshire, MD;  Location: [place] [facility];  Service: Cardiovascular;  Laterality: N/A;   PARS PLANA VITRECTOMY Left 08/23/2018   Procedure: PARS PLANA VITRECTOMY 25 GAUGE FOR ENDOPHTHALMITIS;  Surgeon: Hurman Horn, MD;  Location: [place];  Service: Ophthalmology;  Laterality: Left;   PARS PLANA VITRECTOMY Left 08/26/2018   Procedure: PARS PLANA VITRECTOMY 25 GAUGE FOR ENDOPHTHALMITIS, diagnostic vitreous tap;  Surgeon: Hurman Horn, MD;  Location: [place];  Service: Ophthalmology;  Laterality: Left;   Family History  Problem Relation Age of Onset   Heart failure Mother    Cirrhosis Mother        hx of - died of nonalcoholic cirrhosis   Coronary artery disease Father 13   Heart attack Father    Depression Father    Other Brother        brain tumor   Heart attack Maternal Grandfather    Colon cancer Paternal Aunt    Pancreatic cancer Cousin    Hyperlipidemia Son    Social History   Tobacco Use   Smoking status: Former    Packs/day: 0.00  Years: 1.00    Pack years: 0.00    Types: Cigarettes    Quit date: 40    Years since quitting: 42.6   Smokeless tobacco: Never   Tobacco comments:    "smoked alot for 1 yr"  Vaping Use   Vaping Use: Never used  Substance Use Topics   Alcohol use: Not Currently    Comment: 05/03/2018 "nothing in the last 10 yrs; take too much RX"   Drug use: No   Current Outpatient Medications  Medication Sig Dispense Refill   aspirin 81 MG tablet Take 1 tablet (81 mg total) by mouth daily. (Patient taking differently: Take 81 mg by mouth as needed.) 30 tablet 3   carvedilol (COREG) 25 MG tablet Take 1.5 tablets (37.5 mg total) by mouth 2 (two) times daily. (Patient taking differently: Take 12.5-25  mg by mouth See admin instructions. Take 12.5 mg by mouth in the morning and 25 mg at bedtime) 270 tablet 3   clopidogrel (PLAVIX) 75 MG tablet Take 1 tablet by mouth daily.     Continuous Blood Gluc Sensor (FREESTYLE LIBRE 14 DAY SENSOR) MISC SMARTSIG:1 Each Topical Every 2 Weeks     digoxin (LANOXIN) 0.125 MG tablet Take 1 tablet (125 mcg total) by mouth daily. 30 tablet 6   furosemide (LASIX) 20 MG tablet Take 1 tablet (20 mg total) by mouth daily. (Patient taking differently: Take 20 mg by mouth every other day.) 30 tablet 0   Insulin Isophane & Regular Human (NOVOLIN 70/30 FLEXPEN RELION) (70-30) 100 UNIT/ML PEN Inject 15 Units into the skin 2 (two) times daily before a meal. 15 mL 0   Insulin Pen Needle 32G X 4 MM MISC 1 Device by Does not apply route 2 (two) times daily. 100 each 0   losartan (COZAAR) 100 MG tablet Take 1 tablet (100 mg total) by mouth daily. (Patient taking differently: Take 100 mg by mouth every other day.) 30 tablet 0   Red Yeast Rice Extract (RED YEAST RICE PO) Take 1 capsule by mouth daily.     spironolactone (ALDACTONE) 25 MG tablet Take 1 tablet (25 mg total) by mouth daily. 30 tablet 4   nitroGLYCERIN (NITROSTAT) 0.4 MG SL tablet Place 1 tablet (0.4 mg total) under the tongue every 5 (five) minutes as needed for chest pain. (Patient not taking: No sig reported) 30 tablet 12   No current facility-administered medications for this visit.   No Known Allergies   Review of Systems: All systems reviewed and negative except where noted in HPI.    No results found.  Physical Exam: Ht 5' 2.5" (1.588 m) Comment: heighg measured without shoes  Wt 149 lb 2 oz (67.6 kg)   BMI 26.84 kg/m  Constitutional: Pleasant,well-developed, Caucasian female in no acute distress. HEENT: Normocephalic and atraumatic. Conjunctivae are normal. No scleral icterus.  Mallampati 2 Cardiovascular: Normal rate, regular rhythm.  Pulmonary/chest: Effort normal and breath sounds normal. No  wheezing, rales or rhonchi. Abdominal: Soft, nondistended, nontender. Bowel sounds active throughout. There are no masses palpable. No hepatomegaly. Extremities: no edema Neurological: Alert and oriented to person place and time. Skin: Skin is warm and dry. No rashes noted. Psychiatric: Normal mood and affect. Behavior is normal.  CBC    Component Value Date/Time   WBC 9.8 01/23/2020 1211   RBC 5.38 (H) 01/23/2020 1211   HGB 15.6 (H) 01/23/2020 1211   HCT 46.4 (H) 01/23/2020 1211   PLT 78 (L) 01/23/2020 1211   MCV 86.2  01/23/2020 1211   MCH 29.0 01/23/2020 1211   MCHC 33.6 01/23/2020 1211   RDW 13.5 01/23/2020 1211   LYMPHSABS 0.7 01/23/2020 1211   MONOABS 0.1 01/23/2020 1211   EOSABS 0.0 01/23/2020 1211   BASOSABS 0.1 01/23/2020 1211    CMP     Component Value Date/Time   NA 134 (L) 01/23/2020 1211   K 3.9 01/23/2020 1211   CL 98 01/23/2020 1211   CO2 24 01/23/2020 1211   GLUCOSE 237 (H) 01/23/2020 1211   BUN 29 (H) 01/23/2020 1211   CREATININE 1.47 (H) 01/23/2020 1211   CALCIUM 8.4 (L) 01/23/2020 1211   PROT 6.4 (L) 01/23/2020 1211   ALBUMIN 3.0 (L) 01/23/2020 1211   ALBUMIN 2.8 (L) 05/03/2018 1151   AST 95 (H) 01/23/2020 1211   ALT 80 (H) 01/23/2020 1211   ALKPHOS 149 (H) 01/23/2020 1211   BILITOT 1.1 01/23/2020 1211   GFRNONAA 37 (L) 01/23/2020 1211   GFRAA 43 (L) 01/23/2020 1211     ASSESSMENT AND PLAN: 65 year old female with history of CHF and CAD status post stent placement 3 years ago on dual antiplatelet therapy, with positive FIT test without overt GI bleeding and no chronic GI symptoms.  Will schedule patient for routine diagnostic colonoscopy.  Will contact patient's cardiologist to assess relative risk of holding Plavix for 5-7 days.  Positive FIT - Diagnostic colonoscopy - Tentatively plan to hold Plavix for 5 days prior to colonoscopy, await cardiologist's recommendation    The details, risks (including bleeding, perforation, infection, missed  lesions, medication reactions and possible hospitalization or surgery if complications occur), benefits, and alternatives to colonoscopy with possible biopsy and possible polypectomy were discussed with the patient and she consents to proceed.    Madie Cahn E. Candis Schatz, [facility]  Leanna Battles, MD

## 2021-05-18 ENCOUNTER — Encounter: Payer: Self-pay | Admitting: Gastroenterology

## 2021-05-23 ENCOUNTER — Telehealth: Payer: Self-pay | Admitting: Gastroenterology

## 2021-05-23 NOTE — Telephone Encounter (Signed)
Called Dr.Paterson's office regarding the clearance letter faxed over. I was directed to his nurse Makayla's VM and left a message.

## 2021-05-23 NOTE — Telephone Encounter (Signed)
Anticoagulation clearance was refaxed to [phone]

## 2021-06-04 NOTE — Telephone Encounter (Signed)
Faxed received from Dr. Sharlett Iles office stating patient could hold Plavix 5 days before procedure.

## 2021-06-12 NOTE — Telephone Encounter (Signed)
Left VM for patient to call me back.

## 2021-06-14 NOTE — Telephone Encounter (Signed)
Called patient and let her know that it was ok to hold Plavix 5 days prior to her procedure. Patient stated understanding and had no questions at the end of call.

## 2021-07-05 DIAGNOSIS — Z961 Presence of intraocular lens: Secondary | ICD-10-CM | POA: Diagnosis not present

## 2021-07-05 DIAGNOSIS — H4052X3 Glaucoma secondary to other eye disorders, left eye, severe stage: Secondary | ICD-10-CM | POA: Diagnosis not present

## 2021-07-05 DIAGNOSIS — H26491 Other secondary cataract, right eye: Secondary | ICD-10-CM | POA: Diagnosis not present

## 2021-07-05 DIAGNOSIS — H3561 Retinal hemorrhage, right eye: Secondary | ICD-10-CM | POA: Diagnosis not present

## 2021-07-05 DIAGNOSIS — H47012 Ischemic optic neuropathy, left eye: Secondary | ICD-10-CM | POA: Diagnosis not present

## 2021-07-05 DIAGNOSIS — E113311 Type 2 diabetes mellitus with moderate nonproliferative diabetic retinopathy with macular edema, right eye: Secondary | ICD-10-CM | POA: Diagnosis not present

## 2021-07-05 DIAGNOSIS — H3582 Retinal ischemia: Secondary | ICD-10-CM | POA: Diagnosis not present

## 2021-07-25 ENCOUNTER — Encounter: Payer: HMO | Admitting: Gastroenterology

## 2021-09-04 DIAGNOSIS — I10 Essential (primary) hypertension: Secondary | ICD-10-CM | POA: Diagnosis not present

## 2021-09-04 DIAGNOSIS — I2510 Atherosclerotic heart disease of native coronary artery without angina pectoris: Secondary | ICD-10-CM | POA: Diagnosis not present

## 2021-09-04 DIAGNOSIS — E1151 Type 2 diabetes mellitus with diabetic peripheral angiopathy without gangrene: Secondary | ICD-10-CM | POA: Diagnosis not present

## 2021-09-04 DIAGNOSIS — E785 Hyperlipidemia, unspecified: Secondary | ICD-10-CM | POA: Diagnosis not present

## 2022-03-18 ENCOUNTER — Telehealth: Payer: Self-pay

## 2022-03-26 ENCOUNTER — Encounter: Payer: HMO | Admitting: Obstetrics

## 2022-05-07 ENCOUNTER — Encounter: Payer: HMO | Admitting: Obstetrics and Gynecology

## 2022-07-30 ENCOUNTER — Telehealth: Payer: Self-pay | Admitting: Pharmacist

## 2022-07-30 ENCOUNTER — Encounter: Payer: Self-pay | Admitting: Pharmacist

## 2022-07-30 DIAGNOSIS — Z79899 Other long term (current) drug therapy: Secondary | ICD-10-CM

## 2022-07-30 NOTE — Progress Notes (Signed)
[facility]) Care Management  [place]   07/30/2022  Sally Hendrix 03-Nov-1955 [id number]       This encounter was created in error - please disregard.

## 2022-07-30 NOTE — Progress Notes (Signed)
[facility])                                            [place] Team    07/30/2022  Sally Hendrix 1955/12/24 [id number]  Called patient to complete a Medicare COA medication review. She did not answer the phone HIPAA compliant message was left on her voicemail.  Plan: Will call the patient back in 3-5 business days.  Elayne Guerin, PharmD, [place] Clinical Pharmacist [phone]

## 2022-08-21 ENCOUNTER — Telehealth: Payer: Self-pay | Admitting: Pharmacist

## 2022-08-21 NOTE — Progress Notes (Signed)
[facility])                                            [place] Team    08/21/2022  Sally Hendrix 11-20-1955 [id number]   Called patient to complete a Medicare COA medication review. She did not answer the phone HIPAA compliant message was left on her voicemail.   Plan: Will call the patient back in 7-14 business days.   Elayne Guerin, PharmD, [place] Clinical Pharmacist [phone]

## 2022-08-26 ENCOUNTER — Telehealth: Payer: Self-pay | Admitting: Pharmacist

## 2022-08-26 NOTE — Progress Notes (Signed)
[facility])                                            [place] Team    08/26/2022  Sally Hendrix [birth year] [id number]  Called patient to complete a Medicare COA medication review. She did not answer the phone HIPAA compliant message was left on her voicemail.   Plan: Will call the patient back in 7-14 business days.   Elayne Guerin, PharmD, [place] Clinical Pharmacist [phone]

## 2022-10-03 ENCOUNTER — Telehealth: Payer: Self-pay | Admitting: Pharmacist

## 2022-10-03 NOTE — Progress Notes (Signed)
[facility])                                            [place] Team    10/03/2022  Sally Hendrix June 13, 1956 [id number]  Patient was called to complete a Medicare COA review.  Unfortunately, she has not been seen this year to complete the review. HIPAA compliant messages have been left on her phone voicemail several times.  Plan: Close out the patient's protocol unsuccessfully.  Elayne Guerin, PharmD, [place] Clinical Pharmacist [phone]

## 2022-11-12 DIAGNOSIS — E785 Hyperlipidemia, unspecified: Secondary | ICD-10-CM | POA: Diagnosis not present

## 2022-11-12 DIAGNOSIS — I10 Essential (primary) hypertension: Secondary | ICD-10-CM | POA: Diagnosis not present

## 2022-11-12 DIAGNOSIS — R7989 Other specified abnormal findings of blood chemistry: Secondary | ICD-10-CM | POA: Diagnosis not present

## 2022-11-12 DIAGNOSIS — E11621 Type 2 diabetes mellitus with foot ulcer: Secondary | ICD-10-CM | POA: Diagnosis not present

## 2022-11-20 DIAGNOSIS — E785 Hyperlipidemia, unspecified: Secondary | ICD-10-CM | POA: Diagnosis not present

## 2022-11-20 DIAGNOSIS — G4733 Obstructive sleep apnea (adult) (pediatric): Secondary | ICD-10-CM | POA: Diagnosis not present

## 2022-11-20 DIAGNOSIS — I2510 Atherosclerotic heart disease of native coronary artery without angina pectoris: Secondary | ICD-10-CM | POA: Diagnosis not present

## 2022-11-20 DIAGNOSIS — Z1339 Encounter for screening examination for other mental health and behavioral disorders: Secondary | ICD-10-CM | POA: Diagnosis not present

## 2022-11-20 DIAGNOSIS — I10 Essential (primary) hypertension: Secondary | ICD-10-CM | POA: Diagnosis not present

## 2022-11-20 DIAGNOSIS — R82998 Other abnormal findings in urine: Secondary | ICD-10-CM | POA: Diagnosis not present

## 2022-11-20 DIAGNOSIS — Z0000 Encounter for general adult medical examination without abnormal findings: Secondary | ICD-10-CM | POA: Diagnosis not present

## 2022-11-20 DIAGNOSIS — E1151 Type 2 diabetes mellitus with diabetic peripheral angiopathy without gangrene: Secondary | ICD-10-CM | POA: Diagnosis not present

## 2022-11-20 DIAGNOSIS — Z8669 Personal history of other diseases of the nervous system and sense organs: Secondary | ICD-10-CM | POA: Diagnosis not present

## 2022-11-20 DIAGNOSIS — Z91148 Patient's other noncompliance with medication regimen for other reason: Secondary | ICD-10-CM | POA: Diagnosis not present

## 2022-11-20 DIAGNOSIS — Z794 Long term (current) use of insulin: Secondary | ICD-10-CM | POA: Diagnosis not present

## 2022-11-20 DIAGNOSIS — Z1331 Encounter for screening for depression: Secondary | ICD-10-CM | POA: Diagnosis not present

## 2022-11-20 DIAGNOSIS — L97512 Non-pressure chronic ulcer of other part of right foot with fat layer exposed: Secondary | ICD-10-CM | POA: Diagnosis not present

## 2022-11-24 ENCOUNTER — Other Ambulatory Visit: Payer: Self-pay | Admitting: Internal Medicine

## 2022-11-24 DIAGNOSIS — Z0000 Encounter for general adult medical examination without abnormal findings: Secondary | ICD-10-CM

## 2023-08-31 DIAGNOSIS — F03C11 Unspecified dementia, severe, with agitation: Secondary | ICD-10-CM | POA: Diagnosis not present

## 2023-08-31 DIAGNOSIS — G609 Hereditary and idiopathic neuropathy, unspecified: Secondary | ICD-10-CM | POA: Diagnosis not present

## 2023-08-31 DIAGNOSIS — E7211 Homocystinuria: Secondary | ICD-10-CM | POA: Diagnosis not present

## 2023-08-31 DIAGNOSIS — R42 Dizziness and giddiness: Secondary | ICD-10-CM | POA: Diagnosis not present

## 2023-08-31 DIAGNOSIS — D510 Vitamin B12 deficiency anemia due to intrinsic factor deficiency: Secondary | ICD-10-CM | POA: Diagnosis not present

## 2023-08-31 DIAGNOSIS — R5383 Other fatigue: Secondary | ICD-10-CM | POA: Diagnosis not present

## 2023-08-31 DIAGNOSIS — R4182 Altered mental status, unspecified: Secondary | ICD-10-CM | POA: Diagnosis not present

## 2023-09-17 DIAGNOSIS — R4182 Altered mental status, unspecified: Secondary | ICD-10-CM | POA: Diagnosis not present

## 2023-11-08 ENCOUNTER — Encounter: Payer: Self-pay | Admitting: Cardiovascular Disease

## 2023-11-08 NOTE — Progress Notes (Signed)
Pt cancelled  This encounter was created in error - please disregard. 

## 2023-11-10 ENCOUNTER — Ambulatory Visit: Payer: HMO | Admitting: Cardiovascular Disease

## 2024-08-22 ENCOUNTER — Other Ambulatory Visit: Payer: Self-pay

## 2024-08-22 ENCOUNTER — Encounter (HOSPITAL_BASED_OUTPATIENT_CLINIC_OR_DEPARTMENT_OTHER): Payer: Self-pay | Admitting: Emergency Medicine

## 2024-08-22 ENCOUNTER — Emergency Department (HOSPITAL_COMMUNITY)

## 2024-08-22 ENCOUNTER — Observation Stay (HOSPITAL_COMMUNITY)

## 2024-08-22 ENCOUNTER — Emergency Department (HOSPITAL_BASED_OUTPATIENT_CLINIC_OR_DEPARTMENT_OTHER)

## 2024-08-22 ENCOUNTER — Inpatient Hospital Stay (HOSPITAL_BASED_OUTPATIENT_CLINIC_OR_DEPARTMENT_OTHER)
Admission: EM | Admit: 2024-08-22 | Discharge: 2024-08-26 | DRG: 065 | Disposition: A | Discharge status: Home / Self Care | Attending: Family Medicine | Admitting: Family Medicine

## 2024-08-22 DIAGNOSIS — D320 Benign neoplasm of cerebral meninges: Secondary | ICD-10-CM | POA: Diagnosis present

## 2024-08-22 DIAGNOSIS — W19XXXA Unspecified fall, initial encounter: Secondary | ICD-10-CM | POA: Diagnosis present

## 2024-08-22 DIAGNOSIS — R42 Dizziness and giddiness: Secondary | ICD-10-CM | POA: Diagnosis not present

## 2024-08-22 DIAGNOSIS — I6389 Other cerebral infarction: Secondary | ICD-10-CM | POA: Diagnosis not present

## 2024-08-22 DIAGNOSIS — E860 Dehydration: Secondary | ICD-10-CM | POA: Diagnosis present

## 2024-08-22 DIAGNOSIS — Z7982 Long term (current) use of aspirin: Secondary | ICD-10-CM

## 2024-08-22 DIAGNOSIS — Z8673 Personal history of transient ischemic attack (TIA), and cerebral infarction without residual deficits: Secondary | ICD-10-CM

## 2024-08-22 DIAGNOSIS — Z79899 Other long term (current) drug therapy: Secondary | ICD-10-CM

## 2024-08-22 DIAGNOSIS — I6349 Cerebral infarction due to embolism of other cerebral artery: Secondary | ICD-10-CM | POA: Diagnosis not present

## 2024-08-22 DIAGNOSIS — E1151 Type 2 diabetes mellitus with diabetic peripheral angiopathy without gangrene: Secondary | ICD-10-CM | POA: Diagnosis not present

## 2024-08-22 DIAGNOSIS — N179 Acute kidney failure, unspecified: Secondary | ICD-10-CM | POA: Diagnosis present

## 2024-08-22 DIAGNOSIS — Z8541 Personal history of malignant neoplasm of cervix uteri: Secondary | ICD-10-CM

## 2024-08-22 DIAGNOSIS — H5462 Unqualified visual loss, left eye, normal vision right eye: Secondary | ICD-10-CM | POA: Diagnosis present

## 2024-08-22 DIAGNOSIS — Z955 Presence of coronary angioplasty implant and graft: Secondary | ICD-10-CM

## 2024-08-22 DIAGNOSIS — E785 Hyperlipidemia, unspecified: Secondary | ICD-10-CM | POA: Diagnosis not present

## 2024-08-22 DIAGNOSIS — R29700 NIHSS score 0: Secondary | ICD-10-CM | POA: Diagnosis not present

## 2024-08-22 DIAGNOSIS — R29701 NIHSS score 1: Secondary | ICD-10-CM | POA: Diagnosis not present

## 2024-08-22 DIAGNOSIS — N1831 Chronic kidney disease, stage 3a: Secondary | ICD-10-CM | POA: Diagnosis present

## 2024-08-22 DIAGNOSIS — I639 Cerebral infarction, unspecified: Secondary | ICD-10-CM | POA: Diagnosis present

## 2024-08-22 DIAGNOSIS — H409 Unspecified glaucoma: Secondary | ICD-10-CM | POA: Diagnosis present

## 2024-08-22 DIAGNOSIS — I110 Hypertensive heart disease with heart failure: Secondary | ICD-10-CM | POA: Diagnosis not present

## 2024-08-22 DIAGNOSIS — R569 Unspecified convulsions: Secondary | ICD-10-CM | POA: Diagnosis present

## 2024-08-22 DIAGNOSIS — Z23 Encounter for immunization: Secondary | ICD-10-CM

## 2024-08-22 DIAGNOSIS — Z794 Long term (current) use of insulin: Secondary | ICD-10-CM

## 2024-08-22 DIAGNOSIS — H81399 Other peripheral vertigo, unspecified ear: Secondary | ICD-10-CM

## 2024-08-22 DIAGNOSIS — R296 Repeated falls: Secondary | ICD-10-CM | POA: Diagnosis present

## 2024-08-22 DIAGNOSIS — Y92009 Unspecified place in unspecified non-institutional (private) residence as the place of occurrence of the external cause: Secondary | ICD-10-CM

## 2024-08-22 DIAGNOSIS — I252 Old myocardial infarction: Secondary | ICD-10-CM

## 2024-08-22 DIAGNOSIS — I6623 Occlusion and stenosis of bilateral posterior cerebral arteries: Secondary | ICD-10-CM | POA: Diagnosis not present

## 2024-08-22 DIAGNOSIS — F32A Depression, unspecified: Secondary | ICD-10-CM | POA: Diagnosis present

## 2024-08-22 DIAGNOSIS — G4733 Obstructive sleep apnea (adult) (pediatric): Secondary | ICD-10-CM | POA: Diagnosis present

## 2024-08-22 DIAGNOSIS — Z818 Family history of other mental and behavioral disorders: Secondary | ICD-10-CM

## 2024-08-22 DIAGNOSIS — I509 Heart failure, unspecified: Secondary | ICD-10-CM | POA: Diagnosis not present

## 2024-08-22 DIAGNOSIS — R1310 Dysphagia, unspecified: Secondary | ICD-10-CM | POA: Diagnosis present

## 2024-08-22 DIAGNOSIS — I130 Hypertensive heart and chronic kidney disease with heart failure and stage 1 through stage 4 chronic kidney disease, or unspecified chronic kidney disease: Secondary | ICD-10-CM | POA: Diagnosis present

## 2024-08-22 DIAGNOSIS — I5042 Chronic combined systolic (congestive) and diastolic (congestive) heart failure: Secondary | ICD-10-CM | POA: Diagnosis present

## 2024-08-22 DIAGNOSIS — Z87891 Personal history of nicotine dependence: Secondary | ICD-10-CM

## 2024-08-22 DIAGNOSIS — I6340 Cerebral infarction due to embolism of unspecified cerebral artery: Secondary | ICD-10-CM | POA: Diagnosis not present

## 2024-08-22 DIAGNOSIS — R29703 NIHSS score 3: Secondary | ICD-10-CM | POA: Diagnosis not present

## 2024-08-22 DIAGNOSIS — Z8249 Family history of ischemic heart disease and other diseases of the circulatory system: Secondary | ICD-10-CM

## 2024-08-22 DIAGNOSIS — Z800 Family history of malignant neoplasm of digestive organs: Secondary | ICD-10-CM

## 2024-08-22 DIAGNOSIS — I6381 Other cerebral infarction due to occlusion or stenosis of small artery: Secondary | ICD-10-CM | POA: Diagnosis not present

## 2024-08-22 DIAGNOSIS — Z87442 Personal history of urinary calculi: Secondary | ICD-10-CM

## 2024-08-22 DIAGNOSIS — I2510 Atherosclerotic heart disease of native coronary artery without angina pectoris: Secondary | ICD-10-CM | POA: Diagnosis present

## 2024-08-22 DIAGNOSIS — E1165 Type 2 diabetes mellitus with hyperglycemia: Secondary | ICD-10-CM | POA: Diagnosis present

## 2024-08-22 DIAGNOSIS — Z91148 Patient's other noncompliance with medication regimen for other reason: Secondary | ICD-10-CM

## 2024-08-22 DIAGNOSIS — I480 Paroxysmal atrial fibrillation: Secondary | ICD-10-CM | POA: Diagnosis present

## 2024-08-22 DIAGNOSIS — R220 Localized swelling, mass and lump, head: Secondary | ICD-10-CM | POA: Diagnosis not present

## 2024-08-22 DIAGNOSIS — E871 Hypo-osmolality and hyponatremia: Secondary | ICD-10-CM | POA: Diagnosis present

## 2024-08-22 DIAGNOSIS — I428 Other cardiomyopathies: Secondary | ICD-10-CM | POA: Diagnosis present

## 2024-08-22 DIAGNOSIS — Z7902 Long term (current) use of antithrombotics/antiplatelets: Secondary | ICD-10-CM

## 2024-08-22 LAB — URINALYSIS, ROUTINE W REFLEX MICROSCOPIC
Bacteria, UA: NONE SEEN
Bilirubin Urine: NEGATIVE
Glucose, UA: 250 mg/dL — AB
Hgb urine dipstick: NEGATIVE
Ketones, ur: NEGATIVE mg/dL
Nitrite: NEGATIVE
Protein, ur: >300 mg/dL — AB
Specific Gravity, Urine: 1.021 (ref 1.005–1.030)
pH: 6.5 (ref 5.0–8.0)

## 2024-08-22 LAB — CBC WITH DIFFERENTIAL/PLATELET
Abs Immature Granulocytes: 0.04 K/uL (ref 0.00–0.07)
Basophils Absolute: 0 K/uL (ref 0.0–0.1)
Basophils Relative: 0 %
Eosinophils Absolute: 0.2 K/uL (ref 0.0–0.5)
Eosinophils Relative: 2 %
HCT: 38.6 % (ref 36.0–46.0)
Hemoglobin: 13.5 g/dL (ref 12.0–15.0)
Immature Granulocytes: 0 %
Lymphocytes Relative: 26 %
Lymphs Abs: 2.8 K/uL (ref 0.7–4.0)
MCH: 29.2 pg (ref 26.0–34.0)
MCHC: 35 g/dL (ref 30.0–36.0)
MCV: 83.5 fL (ref 80.0–100.0)
Monocytes Absolute: 0.5 K/uL (ref 0.1–1.0)
Monocytes Relative: 5 %
Neutro Abs: 7.4 K/uL (ref 1.7–7.7)
Neutrophils Relative %: 67 %
Platelets: 279 K/uL (ref 150–400)
RBC: 4.62 MIL/uL (ref 3.87–5.11)
RDW: 12.6 % (ref 11.5–15.5)
WBC: 11.1 K/uL — ABNORMAL HIGH (ref 4.0–10.5)
nRBC: 0 % (ref 0.0–0.2)

## 2024-08-22 LAB — COMPREHENSIVE METABOLIC PANEL WITH GFR
ALT: 10 U/L (ref 0–44)
AST: 15 U/L (ref 15–41)
Albumin: 3.8 g/dL (ref 3.5–5.0)
Alkaline Phosphatase: 130 U/L — ABNORMAL HIGH (ref 38–126)
Anion gap: 12 (ref 5–15)
BUN: 29 mg/dL — ABNORMAL HIGH (ref 8–23)
CO2: 25 mmol/L (ref 22–32)
Calcium: 10.1 mg/dL (ref 8.9–10.3)
Chloride: 96 mmol/L — ABNORMAL LOW (ref 98–111)
Creatinine, Ser: 1.78 mg/dL — ABNORMAL HIGH (ref 0.44–1.00)
GFR, Estimated: 30 mL/min — ABNORMAL LOW (ref >60)
Glucose, Bld: 274 mg/dL — ABNORMAL HIGH (ref 70–99)
Potassium: 3.9 mmol/L (ref 3.5–5.1)
Sodium: 133 mmol/L — ABNORMAL LOW (ref 135–145)
Total Bilirubin: 0.4 mg/dL (ref 0.0–1.2)
Total Protein: 7 g/dL (ref 6.5–8.1)

## 2024-08-22 LAB — CBG MONITORING, ED: Glucose-Capillary: 202 mg/dL — ABNORMAL HIGH (ref 70–99)

## 2024-08-22 LAB — LIPID PANEL
Cholesterol: 322 mg/dL — ABNORMAL HIGH (ref 0–200)
HDL: 37 mg/dL — ABNORMAL LOW (ref >40)
LDL Cholesterol: 215 mg/dL — ABNORMAL HIGH (ref 0–99)
Total CHOL/HDL Ratio: 8.7 ratio
Triglycerides: 349 mg/dL — ABNORMAL HIGH (ref <150)
VLDL: 70 mg/dL — ABNORMAL HIGH (ref 0–40)

## 2024-08-22 LAB — HEMOGLOBIN A1C
Hgb A1c MFr Bld: 11.1 % — ABNORMAL HIGH (ref 4.8–5.6)
Mean Plasma Glucose: 271.87 mg/dL

## 2024-08-22 MED ORDER — ASPIRIN 81 MG PO CHEW
81.0000 mg | CHEWABLE_TABLET | Freq: Every day | ORAL | Status: DC
  Administered 2024-08-22 – 2024-08-26 (×5): 81 mg via ORAL
  Filled 2024-08-22 (×5): qty 1

## 2024-08-22 MED ORDER — STROKE: EARLY STAGES OF RECOVERY BOOK
Freq: Once | Status: AC
  Administered 2024-08-23: 1
  Filled 2024-08-22: qty 1

## 2024-08-22 MED ORDER — ONDANSETRON HCL 4 MG/2ML IJ SOLN: 4.0000 mg | Freq: Four times a day (QID) | INTRAMUSCULAR | Status: DC | PRN

## 2024-08-22 MED ORDER — SODIUM CHLORIDE 0.9 % IV BOLUS
1000.0000 mL | Freq: Once | INTRAVENOUS | Status: AC
  Administered 2024-08-22: 1000 mL via INTRAVENOUS

## 2024-08-22 MED ORDER — HYDRALAZINE HCL 20 MG/ML IJ SOLN: 10.0000 mg | Freq: Four times a day (QID) | INTRAMUSCULAR | Status: DC | PRN

## 2024-08-22 MED ORDER — ACETAMINOPHEN 160 MG/5ML PO SOLN: 650.0000 mg | ORAL | Status: DC | PRN

## 2024-08-22 MED ORDER — SENNOSIDES-DOCUSATE SODIUM 8.6-50 MG PO TABS: 1.0000 | ORAL_TABLET | Freq: Every evening | ORAL | Status: DC | PRN

## 2024-08-22 MED ORDER — ENOXAPARIN SODIUM 40 MG/0.4ML IJ SOSY
40.0000 mg | PREFILLED_SYRINGE | INTRAMUSCULAR | Status: DC
  Administered 2024-08-22: 40 mg via SUBCUTANEOUS
  Filled 2024-08-22: qty 0.4

## 2024-08-22 MED ORDER — ACETAMINOPHEN 650 MG RE SUPP: 650.0000 mg | RECTAL | Status: DC | PRN

## 2024-08-22 MED ORDER — SODIUM CHLORIDE 0.9 % IV SOLN: INTRAVENOUS | Status: AC

## 2024-08-22 MED ORDER — CLOPIDOGREL BISULFATE 75 MG PO TABS
75.0000 mg | ORAL_TABLET | Freq: Every day | ORAL | Status: DC
  Administered 2024-08-22 – 2024-08-26 (×5): 75 mg via ORAL
  Filled 2024-08-22 (×5): qty 1

## 2024-08-22 MED ORDER — ACETAMINOPHEN 325 MG PO TABS: 650.0000 mg | ORAL_TABLET | ORAL | Status: DC | PRN

## 2024-08-22 NOTE — Consult Note (Addendum)
 NEUROLOGY CONSULT NOTE   Date of service: August 22, 2024 Patient Name: Sally Hendrix MRN:  [id number] DOB:  08-04-1956 Chief Complaint: strokes on MRI Brain Requesting Provider: Dorinda Drue DASEN, MD  History of Present Illness  Sally Hendrix is a 68 y.o. female with hx of CHF, MI, depression, obesity, sleep apnea, DM2, glaucoma, who presents with episodic dizziness that is worse with movements along with nausea and vomiting. Has been ongoing for 4-5 days now. She attempted epleys maneuver at home and that helped a bit. Never had it this bad.  Came to the ED after discussion with her PCP.  MRI Brain with embolic appearing stroke in BL cerebral hemispheres that are incidental and do not explain her vertigo. She denies any prior hx of strokes, endorses her father had strokes.  She endorses a history of diabetes and reports it has been difficult to get her A1c under goal.  Endorses a history of hypertension and hyperlipidemia.  She quit smoking 40 years ago, does not use any recreational substances.  LKW: Unclear as she does not have any symptoms at attributable to the stroke.   Modified rankin score: 0-Completely asymptomatic and back to baseline post- stroke IV Thrombolysis: Not offered, no symptoms at this time after peelable 2 strokes.   EVT: Not offered, low suspicion for an LVO.    NIHSS components Score: Comment  1a Level of Conscious 0[]  1[]  2[]  3[]      1b LOC Questions 0[]  1[]  2[]       1c LOC Commands 0[]  1[]  2[]       2 Best Gaze 0[]  1[]  2[]       3 Visual 0[]  1[]  2[]  3[]      4 Facial Palsy 0[]  1[]  2[]  3[]      5a Motor Arm - left 0[]  1[]  2[]  3[]  4[]  UN[]    5b Motor Arm - Right 0[]  1[]  2[]  3[]  4[]  UN[]    6a Motor Leg - Left 0[]  1[]  2[]  3[]  4[]  UN[]    6b Motor Leg - Right 0[]  1[]  2[]  3[]  4[]  UN[]    7 Limb Ataxia 0[]  1[]  2[]  UN[]      8 Sensory 0[]  1[]  2[]  UN[]      9 Best Language 0[]  1[]  2[]  3[]      10 Dysarthria 0[]  1[]  2[]  UN[]      11 Extinct. and Inattention 0[]  1[]  2[]        TOTAL: 0      ROS  Comprehensive ROS performed and pertinent positives documented in HPI   Past History   Past Medical History:  Diagnosis Date   Cervical cancer (HCC) 1980s   CHF (congestive heart failure) (HCC)    Depression    all my life (05/03/2018)   Glaucoma    History of kidney stones    Hypertension    Myocardial infarction (HCC) 12/10/2016   Nonischemic cardiomyopathy (HCC)    Obesity    Seizure (HCC) 05/02/2018 X2-3   Sleep apnea    not dx'd (05/03/2018)   Type II diabetes mellitus (HCC)     Past Surgical History:  Procedure Laterality Date   CARDIAC CATHETERIZATION     CATARACT EXTRACTION Bilateral    CERVIX LESION DESTRUCTION  1980s   for cancer   CORONARY STENT INTERVENTION N/A 12/10/2016   Procedure: Coronary Stent Intervention;  Surgeon: Deatrice DELENA Cage, MD;  Location: [facility];  Service: Cardiovascular;  Laterality: N/A;   LEFT HEART CATH AND CORONARY ANGIOGRAPHY N/A 12/10/2016   Procedure: Left Heart Cath  and Coronary Angiography;  Surgeon: Deatrice DELENA Cage, MD;  Location: [facility];  Service: Cardiovascular;  Laterality: N/A;   PARS PLANA VITRECTOMY Left 08/23/2018   Procedure: PARS PLANA VITRECTOMY 25 GAUGE FOR ENDOPHTHALMITIS;  Surgeon: Elner Arley DELENA, MD;  Location: [facility] OR;  Service: Ophthalmology;  Laterality: Left;   PARS PLANA VITRECTOMY Left 08/26/2018   Procedure: PARS PLANA VITRECTOMY 25 GAUGE FOR ENDOPHTHALMITIS, diagnostic vitreous tap;  Surgeon: Elner Arley DELENA, MD;  Location: [facility] OR;  Service: Ophthalmology;  Laterality: Left;    Family History: Family History  Problem Relation Age of Onset   Heart failure Mother    Cirrhosis Mother        hx of - died of nonalcoholic cirrhosis   Coronary artery disease Father 69   Heart attack Father    Depression Father    Other Brother        brain tumor   Heart attack Maternal Grandfather    Colon cancer Paternal Aunt    Pancreatic cancer Cousin    Hyperlipidemia Son      Social History  reports that she quit smoking about 46 years ago. Her smoking use included cigarettes. She has never used smokeless tobacco. She reports that she does not currently use alcohol . She reports that she does not use drugs.  No Known Allergies  Medications   Current Facility-Administered Medications:    [START ON 08/23/2024]  stroke: early stages of recovery book, , Does not apply, Once, Djan, Prince T, MD   0.9 %  sodium chloride  infusion, , Intravenous, Continuous, Djan, Drue DASEN, MD, Last Rate: 40 mL/hr at 08/22/24 2141, New Bag at 08/22/24 2141   acetaminophen  (TYLENOL ) tablet 650 mg, 650 mg, Oral, Q4H PRN **OR** acetaminophen  (TYLENOL ) 160 MG/5ML solution 650 mg, 650 mg, Per Tube, Q4H PRN **OR** acetaminophen  (TYLENOL ) suppository 650 mg, 650 mg, Rectal, Q4H PRN, Dorinda, Drue DASEN, MD   aspirin  chewable tablet 81 mg, 81 mg, Oral, Daily, Djan, Prince T, MD   clopidogrel (PLAVIX) tablet 75 mg, 75 mg, Oral, Daily, Djan, Prince T, MD   hydrALAZINE  (APRESOLINE ) injection 10 mg, 10 mg, Intravenous, Q6H PRN, Djan, Drue DASEN, MD   ondansetron  (ZOFRAN ) injection 4 mg, 4 mg, Intravenous, Q6H PRN, Djan, Drue DASEN, MD   senna-docusate (Senokot-S) tablet 1 tablet, 1 tablet, Oral, QHS PRN, Dorinda Drue DASEN, MD  Current Outpatient Medications:    aspirin  81 MG tablet, Take 1 tablet (81 mg total) by mouth daily. (Patient taking differently: Take 81 mg by mouth as needed.), Disp: 30 tablet, Rfl: 3   carvedilol  (COREG ) 25 MG tablet, Take 1.5 tablets (37.5 mg total) by mouth 2 (two) times daily. (Patient taking differently: Take 12.5-25 mg by mouth See admin instructions. Take 12.5 mg by mouth in the morning and 25 mg at bedtime), Disp: 270 tablet, Rfl: 3   clopidogrel (PLAVIX) 75 MG tablet, Take 1 tablet by mouth daily., Disp: , Rfl:    Continuous Blood Gluc Sensor (FREESTYLE LIBRE 14 DAY SENSOR) MISC, SMARTSIG:1 Each Topical Every 2 Weeks, Disp: , Rfl:    digoxin  (LANOXIN ) 0.125 MG tablet, Take 1  tablet (125 mcg total) by mouth daily., Disp: 30 tablet, Rfl: 6   furosemide  (LASIX ) 20 MG tablet, Take 1 tablet (20 mg total) by mouth daily. (Patient taking differently: Take 20 mg by mouth every other day.), Disp: 30 tablet, Rfl: 0   Insulin  Isophane & Regular Human (NOVOLIN 70/30 FLEXPEN RELION) (70-30) 100 UNIT/ML PEN, Inject 15 Units  into the skin 2 (two) times daily before a meal., Disp: 15 mL, Rfl: 0   Insulin  Pen Needle 32G X 4 MM MISC, 1 Device by Does not apply route 2 (two) times daily., Disp: 100 each, Rfl: 0   losartan  (COZAAR ) 100 MG tablet, Take 1 tablet (100 mg total) by mouth daily. (Patient taking differently: Take 100 mg by mouth every other day.), Disp: 30 tablet, Rfl: 0   nitroGLYCERIN  (NITROSTAT ) 0.4 MG SL tablet, Place 1 tablet (0.4 mg total) under the tongue every 5 (five) minutes as needed for chest pain. (Patient not taking: No sig reported), Disp: 30 tablet, Rfl: 12   Red Yeast Rice Extract (RED YEAST RICE PO), Take 1 capsule by mouth daily., Disp: , Rfl:    spironolactone  (ALDACTONE ) 25 MG tablet, Take 1 tablet (25 mg total) by mouth daily., Disp: 30 tablet, Rfl: 4  Vitals   Vitals:   08/22/24 1328 08/22/24 1413 08/22/24 1616 08/22/24 1819  BP: (!) 181/103 (!) 203/118 (!) 175/119 (!) 169/111  Pulse: 90 100 90 88  Resp: 19 18 15 16   Temp: 98.4 F (36.9 C) 98.2 F (36.8 C)  97.6 F (36.4 C)  TempSrc:    Oral  SpO2: 98% 98% 98% 99%    There is no height or weight on file to calculate BMI.   Physical Exam   General: Laying comfortably in bed; in no acute distress.  HENT: Normal oropharynx and mucosa. Normal external appearance of ears and nose.  Neck: Supple, no pain or tenderness  CV: No JVD. No peripheral edema.  Pulmonary: Symmetric Chest rise. Normal respiratory effort.  Abdomen: Soft to touch, non-tender.  Ext: No cyanosis, edema, or deformity  Skin: No rash. Normal palpation of skin.   Musculoskeletal: Normal digits and nails by inspection. No  clubbing.   Neurologic Examination  Mental status/Cognition: Alert, oriented to self, place, month and year, good attention.  Speech/language: Fluent, comprehension intact, object naming intact, repetition intact.  Cranial nerves:   CN II Pupils equal and reactive to light in the right eye, unreactive left eye pupil.  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   CN III,IV,VI EOM intact, no gaze preference or deviation, no nystagmus    CN V normal sensation in V1, V2, and V3 segments bilaterally    CN VII no asymmetry, no nasolabial fold flattening    CN VIII normal hearing to speech    CN IX & X normal palatal elevation, no uvular deviation    CN XI 5/5 head turn and 5/5 shoulder shrug bilaterally    CN XII midline tongue protrusion    Motor:  Muscle bulk: Normal, tone normal, pronator drift none.  Tremor none. Mvmt Root Nerve  Muscle Right Left Comments  SA C5/6 Ax Deltoid 5 5   EF C5/6 Mc Biceps 5 5   EE C6/7/8 Rad Triceps 5 5   WF C6/7 Med FCR     WE C7/8 PIN ECU     F Ab C8/T1 U ADM/FDI 5 5   HF L1/2/3 Fem Illopsoas 5 5   KE L2/3/4 Fem Quad 5 5   DF L4/5 D Peron Tib Ant 5 5   PF S1/2 Tibial Grc/Sol 5 5    Sensation:  Light touch Intact throughout   Pin prick    Temperature    Vibration   Proprioception    Coordination/Complex Motor:  - Finger to Nose intact bilaterally - Heel to shin intact bilaterally -  Rapid alternating movement slightly slowed in left lower extremity. - Gait: Stride length normal. Arm swing normal. Base width narrow.   Labs/Imaging/Neurodiagnostic studies   CBC:  Recent Labs  Lab 08/24/24 1119  WBC 11.1*  NEUTROABS 7.4  HGB 13.5  HCT 38.6  MCV 83.5  PLT 279   Basic Metabolic Panel:  Lab Results  Component Value Date   NA 133 (L) 08-24-24   K 3.9 08/24/24   CO2 25 08/24/24   GLUCOSE 274 (H) Aug 24, 2024   BUN 29 (H) 08-24-24   CREATININE 1.78 (H) 24-Aug-2024   CALCIUM  10.1 08-24-24   GFRNONAA 30 (L)  August 24, 2024   GFRAA 43 (L) 01/23/2020   Lipid Panel:  Lab Results  Component Value Date   LDLCALC 126 (H) 05/05/2018   HgbA1c:  Lab Results  Component Value Date   HGBA1C 11.1 (H) 2024-08-24   Urine Drug Screen:     Component Value Date/Time   LABOPIA NONE DETECTED 05/02/2018 2358   COCAINSCRNUR NONE DETECTED 05/02/2018 2358   LABBENZ POSITIVE (A) 05/02/2018 2358   AMPHETMU NONE DETECTED 05/02/2018 2358   THCU NONE DETECTED 05/02/2018 2358   LABBARB NONE DETECTED 05/02/2018 2358    Alcohol  Level     Component Value Date/Time   ETH <10 05/03/2018 0003   INR  Lab Results  Component Value Date   INR 0.87 02/11/2018   APTT  Lab Results  Component Value Date   APTT 26 02/11/2018   AED levels: No results found for: PHENYTOIN, ZONISAMIDE, LAMOTRIGINE, LEVETIRACETA  CT Head without contrast(Personally reviewed): CTH was negative for a large hypodensity concerning for a large territory infarct or hyperdensity concerning for an ICH  MR angio head without contrast and ultrasound carotid Doppler (Personally reviewed): Pending  MRI Brain(Personally reviewed): 1. Small acute infarcts in the left subinsular white mater, right corona radiata, and right parietal lobe. 2. Since 2019, increase in size of a now 2.2 cm meningioma. Minimal mass effect. No brain edema. 3. Opacified right maxillary sinus.  ASSESSMENT   Sally Hendrix is a 68 y.o. female with hx of CHF, MI, depression, obesity, sleep apnea, DM2, glaucoma, who presents with episodic dizziness that is worse with movements along with nausea and vomiting. Has been ongoing for 4-5 days now. She attempted epleys maneuver at home and that helped a bit. Never had it this bad.  Came to the ED after discussion with her PCP.  MRI Brain with embolic appearing stroke in BL cerebral hemispheres that are incidental and do not explain her vertigo. Vertigo is likely peripheral.  Etiology of her infarcts is unclear and is  pending full workup.  Labs also concerning for an AKI?  RECOMMENDATIONS  - Frequent Neuro checks per stroke unit protocol - Recommend brain imaging with MRI Brain without contrast - Recommend Vascular imaging with MR angio head without contrast and ultrasound carotid Doppler bilateral. - Recommend obtaining TTE  - Recommend obtaining Lipid panel with LDL - Please start statin if LDL > 70 - Recommend HbA1c to evaluate for diabetes and how well it is controlled. - Antithrombotic -aspirin  81 mg daily along with Plavix 75 mg daily for 21 days, followed by aspirin  81 mg daily alone. - Recommend DVT ppx - SBP goal - permissive hypertension first 24 h < 220/110. Held home meds.  - Recommend Telemetry monitoring for arrythmia - Recommend bedside swallow screen prior to PO intake. - Stroke education booklet - Recommend PT/OT/SLP consult  Will defer evaluation and management of AKI  to hospitalist team.  ______________________________________________________________________  Plan discussed with patient and the husband at the bedside.  Plan also discussed with the hospitalist team over secure chat.  Signed, Nigel Ericsson, MD [facility]

## 2024-08-22 NOTE — ED Provider Notes (Signed)
 [facility] EMERGENCY DEPARTMENT AT [facility] Provider Note   CSN: [id number] Arrival date & time: 08/22/24  1022     Patient presents with: Dizziness   Sally Hendrix is a 68 y.o. female with a history of vertigo, insulin -dependent diabetes, hypertension, CHF, MI presents with complaints of dizziness x 3 days.  Dizziness is episodic and worse with movement.  She states it feels like her vertigo and has sometimes responded to her Epley maneuvers.  However she has never had her symptoms to this degree.  She has been vomiting and falling over.  Yesterday she states she had a mild headache.  No extremity weakness or numbness.  She is without any dizziness currently.  Denies any chest pain, shortness of breath, abdominal pain.  Family at bedside reports that she has been somewhat confused as well, saying obscure things and not knowing where she is.      Dizziness  Past Medical History:  Diagnosis Date   Cervical cancer (HCC) 1980s   CHF (congestive heart failure) (HCC)    Depression    all my life (05/03/2018)   Glaucoma    History of kidney stones    Hypertension    Myocardial infarction (HCC) 12/10/2016   Nonischemic cardiomyopathy (HCC)    Obesity    Seizure (HCC) 05/02/2018 X2-3   Sleep apnea    not dx'd (05/03/2018)   Type II diabetes mellitus (HCC)    Past Surgical History:  Procedure Laterality Date   CARDIAC CATHETERIZATION     CATARACT EXTRACTION Bilateral    CERVIX LESION DESTRUCTION  1980s   for cancer   CORONARY STENT INTERVENTION N/A 12/10/2016   Procedure: Coronary Stent Intervention;  Surgeon: Deatrice DELENA Cage, MD;  Location: [facility];  Service: Cardiovascular;  Laterality: N/A;   LEFT HEART CATH AND CORONARY ANGIOGRAPHY N/A 12/10/2016   Procedure: Left Heart Cath and Coronary Angiography;  Surgeon: Deatrice DELENA Cage, MD;  Location: [facility];  Service: Cardiovascular;  Laterality: N/A;   PARS PLANA VITRECTOMY Left 08/23/2018    Procedure: PARS PLANA VITRECTOMY 25 GAUGE FOR ENDOPHTHALMITIS;  Surgeon: Elner Arley DELENA, MD;  Location: [facility] OR;  Service: Ophthalmology;  Laterality: Left;   PARS PLANA VITRECTOMY Left 08/26/2018   Procedure: PARS PLANA VITRECTOMY 25 GAUGE FOR ENDOPHTHALMITIS, diagnostic vitreous tap;  Surgeon: Elner Arley DELENA, MD;  Location: [facility] OR;  Service: Ophthalmology;  Laterality: Left;       Prior to Admission medications   Medication Sig Start Date End Date Taking? Authorizing Provider  aspirin  81 MG tablet Take 1 tablet (81 mg total) by mouth daily. Patient taking differently: Take 81 mg by mouth as needed. 12/11/16   Rai, Nydia POUR, MD  carvedilol  (COREG ) 25 MG tablet Take 1.5 tablets (37.5 mg total) by mouth 2 (two) times daily. Patient taking differently: Take 12.5-25 mg by mouth See admin instructions. Take 12.5 mg by mouth in the morning and 25 mg at bedtime 12/18/16 05/03/26  Nahser, Aleene PARAS, MD  clopidogrel (PLAVIX) 75 MG tablet Take 1 tablet by mouth daily. 08/12/18   [provider]  Continuous Blood Gluc Sensor (FREESTYLE LIBRE 14 DAY SENSOR) MISC SMARTSIG:1 Each Topical Every 2 Weeks 03/20/21   [provider]  digoxin  (LANOXIN ) 0.125 MG tablet Take 1 tablet (125 mcg total) by mouth daily. 12/11/16   Rai, Nydia POUR, MD  furosemide  (LASIX ) 20 MG tablet Take 1 tablet (20 mg total) by mouth daily. Patient taking differently: Take 20 mg  by mouth every other day. 02/11/18   Garrick Charleston, MD  Insulin  Isophane & Regular Human (NOVOLIN 70/30 FLEXPEN RELION) (70-30) 100 UNIT/ML PEN Inject 15 Units into the skin 2 (two) times daily before a meal. 05/05/18   Cindy Garnette POUR, MD  Insulin  Pen Needle 32G X 4 MM MISC 1 Device by Does not apply route 2 (two) times daily. 05/05/18   Cindy Garnette POUR, MD  losartan  (COZAAR ) 100 MG tablet Take 1 tablet (100 mg total) by mouth daily. Patient taking differently: Take 100 mg by mouth every other day. 02/11/18 10/23/23  Garrick Charleston, MD  nitroGLYCERIN   (NITROSTAT ) 0.4 MG SL tablet Place 1 tablet (0.4 mg total) under the tongue every 5 (five) minutes as needed for chest pain. Patient not taking: No sig reported 12/11/16   Rai, Nydia POUR, MD  Red Yeast Rice Extract (RED YEAST RICE PO) Take 1 capsule by mouth daily.    [provider]  spironolactone  (ALDACTONE ) 25 MG tablet Take 1 tablet (25 mg total) by mouth daily. 12/11/16   Rai, Nydia POUR, MD    Allergies: Patient has no known allergies.    Review of Systems  Neurological:  Positive for dizziness.    Updated Vital Signs BP (!) 160/102 (BP Location: Right Arm)   Pulse 91   Temp 97.7 F (36.5 C) (Oral)   Resp 18   SpO2 99%   Physical Exam Vitals and nursing note reviewed.  Constitutional:      General: She is not in acute distress.    Appearance: She is well-developed.  HENT:     Head: Normocephalic and atraumatic.  Eyes:     Conjunctiva/sclera: Conjunctivae normal.  Cardiovascular:     Rate and Rhythm: Normal rate and regular rhythm.     Heart sounds: No murmur heard. Pulmonary:     Effort: Pulmonary effort is normal. No respiratory distress.     Breath sounds: Normal breath sounds.  Abdominal:     Palpations: Abdomen is soft.     Tenderness: There is no abdominal tenderness.  Musculoskeletal:        General: No swelling.     Cervical back: Neck supple.  Skin:    General: Skin is warm and dry.     Capillary Refill: Capillary refill takes less than 2 seconds.  Neurological:     Mental Status: She is alert.     Comments: Patient is alert and oriented. There is no abnormal phonation. Symmetric smile without facial droop. No pronator drift. Moves all extremities spontaneously. 5/5 strength in upper and lower extremities. . No sensation deficit. There is no nystagmus. EOMI, Right eye is RRL. Blind in left eye.  Coordination intact with finger to nose and normal ambulation.    Psychiatric:        Mood and Affect: Mood normal.     (all labs ordered are listed,  but only abnormal results are displayed) Labs Reviewed  CBC WITH DIFFERENTIAL/PLATELET - Abnormal; Notable for the following components:      Result Value   WBC 11.1 (*)    All other components within normal limits  COMPREHENSIVE METABOLIC PANEL WITH GFR - Abnormal; Notable for the following components:   Sodium 133 (*)    Chloride 96 (*)    Glucose, Bld 274 (*)    BUN 29 (*)    Creatinine, Ser 1.78 (*)    Alkaline Phosphatase 130 (*)    GFR, Estimated 30 (*)    All other components  within normal limits  URINALYSIS, ROUTINE W REFLEX MICROSCOPIC - Abnormal; Notable for the following components:   APPearance HAZY (*)    Glucose, UA 250 (*)    Protein, ur >300 (*)    Leukocytes,Ua LARGE (*)    All other components within normal limits    EKG: EKG Interpretation Date/Time:  Monday August 22 2024 11:40:49 EST Ventricular Rate:  99 PR Interval:  193 QRS Duration:  106 QT Interval:  387 QTC Calculation: 497 R Axis:   47  Text Interpretation: Sinus rhythm Confirmed by Ruthe Cornet [phone]) on 08/22/2024 12:03:01 PM  Radiology: CT Head Wo Contrast Result Date: 08/22/2024 EXAM: CT HEAD WITHOUT CONTRAST 08/22/2024 11:14:34 AM TECHNIQUE: CT of the head was performed without the administration of intravenous contrast. Automated exposure control, iterative reconstruction, and/or weight based adjustment of the mA/kV was utilized to reduce the radiation dose to as low as reasonably achievable. COMPARISON: Head CT 05/03/2018 and MRI 05/05/2018 CLINICAL HISTORY: Dizziness, falls. Symptoms for 3 days. FINDINGS: BRAIN AND VENTRICLES: There is no evidence of an acute infarct, intracranial hemorrhage, midline shift, hydrocephalus, or extra-axial fluid collection. Cerebral volume is within normal limits for age. An extra-axial mass over the left frontal convexity has enlarged, now measuring 1.9 x 2.3 x 1.3 cm. There is minimal mass effect on the underlying left frontal brain parenchyma without  evidence of edema. A chronic infarct in the anterior body of the corpus callosum to the left of midline is new. Calcified atherosclerosis at the skull base. ORBITS: Bilateral cataract extraction. SINUSES: Chronic right maxillary sinusitis. Small bilateral mastoid effusions. SOFT TISSUES AND SKULL: No acute soft tissue abnormality. No skull fracture. IMPRESSION: 1. No acute intracranial abnormality. 2. Interval enlargement of a now 2.3 cm left frontal convexity meningioma with minimal mass effect and no edema. 3. Interval chronic infarct in the corpus callosum. Electronically signed by: Dasie Hamburg MD 08/22/2024 11:24 AM EST RP Workstation: [id number]     Procedures   Medications Ordered in the ED  sodium chloride  0.9 % bolus 1,000 mL (1,000 mLs Intravenous New Bag/Given 08/22/24 1219)    Clinical Course as of 08/22/24 1312  Mon Aug 22, 2024  1057 Patient evaluated for dizziness x 3 days with associated confusion, falls and vomiting.  Upon arrival she is hypertensive.  Reports that she did not take her blood pressure medication today.  Has no neurodeficits on exam.  Will obtain labs and CT imaging. [JT]  1146 Urinalysis, Routine w reflex microscopic -Urine, Clean Catch(!) Large amount of leukocytes with many squamous cells, no bacteria seen, 21-50 WBCs, significant proteinuria. Likely contaminant. [JT]  1147 CT Head Wo Contrast No acute intracranial abnormality, there is interval enlargement of the left frontal convexity meningioma melanoma with minimal mass effect and no edema [JT]  1211 Comprehensive metabolic panel(!) Mildly elevated creatinine to 1.78, elevated alk phos near baseline.   [JT]  1233 CBC with Differential(!) Mild leukocytosis of 11.1, no infectious symptoms [JT]  1256 Patient will be transferred over to emergency [facility] for brain MRI.  Patient and family are understanding and agreement with plan. [JT]    Clinical Course User Index [JT] Donnajean Lynwood DEL, PA-C                                  Medical Decision Making Amount and/or Complexity of Data Reviewed Labs: ordered. Decision-making details documented in ED Course. Radiology: ordered. Decision-making details documented in ED  Course.   This patient presents to the ED with chief complaint(s) of dizziness .  The complaint involves an extensive differential diagnosis and also carries with it a high risk of complications and morbidity.   Pertinent past medical history as listed in HPI  The differential diagnosis includes  CVA, TIA, subarachnoid hemorrhage, peripheral vertigo, meningitis, infectious source such as UTI Additional history obtained: Additional history obtained from family Records reviewed Care Everywhere/External Records  Disposition:   Patient transferred POV to [facility]  Social Determinants of Health:   none  This note was dictated with voice recognition software.  Despite best efforts at proofreading, errors may have occurred which can change the documentation meaning.       Final diagnoses:  Dizziness    ED Discharge Orders     None          Donnajean Lynwood DEL, PA-C 08/22/24 1312    Ruthe Cornet, DO 08/22/24 1358

## 2024-08-22 NOTE — Discharge Instructions (Addendum)
 Advised to follow-up with primary care physician in 1 week. Advised to follow-up with cardiology in 4 weeks. Advised to take aspirin  and Plavix  for 3 weeks followed by aspirin  monotherapy. Advised to take Entresto  and Coreg  for systolic CHF. Advised to follow-up with neurology as scheduled.

## 2024-08-22 NOTE — ED Provider Notes (Signed)
 68 year old female sent to [place] for MRI from [facility] emergency department.  Patient was seen and evaluated with episodic dizziness worse with movement. Patient had elevated blood pressure 160/1 and 2 Here in the ED it has increased to 203/118 MRI is ordered Care discussed with Dr. Ellouise who will reevaluate blood pressure and patient status after MRI has been completed   Levander Houston, MD 08/22/24 [phone]

## 2024-08-22 NOTE — H&P (Signed)
 History and Physical    Patient: Sally Hendrix FMW:[id number] DOB: [birth year] DOA: 08/22/2024 DOS: the patient was seen and examined on 08/22/2024 PCP: Yolande Toribio MATSU, MD  Patient coming from: Home  Chief Complaint: Dizziness Chief Complaint  Patient presents with   Dizziness   HPI: Sally Hendrix is a 68 y.o. female with medical history significant of depression, CHF, hypertension, coronary artery disease, obesity, type 2 diabetes, seizures, sleep apnea.    ED course: Upon arrival to the emergency room patient had temperature 97.7, respiratory rate 18, pulse 91, blood pressure 160/102 saturating 99% on room air. MRI of the brain showed: IMPRESSION: 1. Small acute infarcts in the left subinsular white mater, right corona radiata, and right parietal lobe. 2. Since 2019, increase in size of a now 2.2 cm meningioma. Minimal mass effect. No brain edema. 3. Opacified right maxillary sinus. ED physician discussed the case with neurologist on-call who recommended patient be admitted to continue with stroke workup.  Review of Systems: As mentioned in the history of present illness. All other systems reviewed and are negative. Past Medical History:  Diagnosis Date   Cervical cancer (HCC) 1980s   CHF (congestive heart failure) (HCC)    Depression    all my life (05/03/2018)   Glaucoma    History of kidney stones    Hypertension    Myocardial infarction (HCC) 12/10/2016   Nonischemic cardiomyopathy (HCC)    Obesity    Seizure (HCC) 05/02/2018 X2-3   Sleep apnea    not dx'd (05/03/2018)   Type II diabetes mellitus (HCC)    Past Surgical History:  Procedure Laterality Date   CARDIAC CATHETERIZATION     CATARACT EXTRACTION Bilateral    CERVIX LESION DESTRUCTION  1980s   for cancer   CORONARY STENT INTERVENTION N/A 12/10/2016   Procedure: Coronary Stent Intervention;  Surgeon: Deatrice DELENA Cage, MD;  Location: [facility];  Service: Cardiovascular;  Laterality: N/A;    LEFT HEART CATH AND CORONARY ANGIOGRAPHY N/A 12/10/2016   Procedure: Left Heart Cath and Coronary Angiography;  Surgeon: Deatrice DELENA Cage, MD;  Location: [facility];  Service: Cardiovascular;  Laterality: N/A;   PARS PLANA VITRECTOMY Left 08/23/2018   Procedure: PARS PLANA VITRECTOMY 25 GAUGE FOR ENDOPHTHALMITIS;  Surgeon: Elner Arley DELENA, MD;  Location: [facility] OR;  Service: Ophthalmology;  Laterality: Left;   PARS PLANA VITRECTOMY Left 08/26/2018   Procedure: PARS PLANA VITRECTOMY 25 GAUGE FOR ENDOPHTHALMITIS, diagnostic vitreous tap;  Surgeon: Elner Arley DELENA, MD;  Location: [facility] OR;  Service: Ophthalmology;  Laterality: Left;   Social History:  reports that she quit smoking about 46 years ago. Her smoking use included cigarettes. She has never used smokeless tobacco. She reports that she does not currently use alcohol . She reports that she does not use drugs.  No Known Allergies  Family History  Problem Relation Age of Onset   Heart failure Mother    Cirrhosis Mother        hx of - died of nonalcoholic cirrhosis   Coronary artery disease Father 44   Heart attack Father    Depression Father    Other Brother        brain tumor   Heart attack Maternal Grandfather    Colon cancer Paternal Aunt    Pancreatic cancer Cousin    Hyperlipidemia Son     Prior to Admission medications   Medication Sig Start Date End Date Taking? Authorizing Provider  aspirin  81 MG tablet  Take 1 tablet (81 mg total) by mouth daily. Patient taking differently: Take 81 mg by mouth as needed. 12/11/16   Rai, Nydia POUR, MD  carvedilol  (COREG ) 25 MG tablet Take 1.5 tablets (37.5 mg total) by mouth 2 (two) times daily. Patient taking differently: Take 12.5-25 mg by mouth See admin instructions. Take 12.5 mg by mouth in the morning and 25 mg at bedtime 12/18/16 05/03/26  Nahser, Aleene PARAS, MD  clopidogrel (PLAVIX) 75 MG tablet Take 1 tablet by mouth daily. 08/12/18   [provider]  Continuous Blood Gluc  Sensor (FREESTYLE LIBRE 14 DAY SENSOR) MISC SMARTSIG:1 Each Topical Every 2 Weeks 03/20/21   [provider]  digoxin  (LANOXIN ) 0.125 MG tablet Take 1 tablet (125 mcg total) by mouth daily. 12/11/16   Rai, Nydia POUR, MD  furosemide  (LASIX ) 20 MG tablet Take 1 tablet (20 mg total) by mouth daily. Patient taking differently: Take 20 mg by mouth every other day. 02/11/18   Garrick Charleston, MD  Insulin  Isophane & Regular Human (NOVOLIN 70/30 FLEXPEN RELION) (70-30) 100 UNIT/ML PEN Inject 15 Units into the skin 2 (two) times daily before a meal. 05/05/18   Cindy Garnette POUR, MD  Insulin  Pen Needle 32G X 4 MM MISC 1 Device by Does not apply route 2 (two) times daily. 05/05/18   Cindy Garnette POUR, MD  losartan  (COZAAR ) 100 MG tablet Take 1 tablet (100 mg total) by mouth daily. Patient taking differently: Take 100 mg by mouth every other day. 02/11/18 10/23/23  Garrick Charleston, MD  nitroGLYCERIN  (NITROSTAT ) 0.4 MG SL tablet Place 1 tablet (0.4 mg total) under the tongue every 5 (five) minutes as needed for chest pain. Patient not taking: No sig reported 12/11/16   Rai, Nydia POUR, MD  Red Yeast Rice Extract (RED YEAST RICE PO) Take 1 capsule by mouth daily.    [provider]  spironolactone  (ALDACTONE ) 25 MG tablet Take 1 tablet (25 mg total) by mouth daily. 12/11/16   Davia Nydia POUR, MD    Physical Exam: Vitals:   08/22/24 1328 08/22/24 1413 08/22/24 1616 08/22/24 1819  BP: (!) 181/103 (!) 203/118 (!) 175/119 (!) 169/111  Pulse: 90 100 90 88  Resp: 19 18 15 16   Temp: 98.4 F (36.9 C) 98.2 F (36.8 C)  97.6 F (36.4 C)  TempSrc:    Oral  SpO2: 98% 98% 98% 99%   General: Elderly female laying in bed in no respiratory distress HEENT: No abnormality detected Respiratory system: Clear to auscultation bilaterally Cardiovascular system: S1-S2 present no murmur heard Abdominal system: Nontender Musculoskeletal: Moving all extremities equally CNS: Alert and oriented x 3 with no lateralizing  signs. Psych: Normal mood  Data Reviewed:     Latest Ref Rng & Units 08/22/2024   11:19 AM 01/23/2020   12:11 PM 08/23/2018    6:31 PM  CBC  WBC 4.0 - 10.5 K/uL 11.1  9.8  13.0   Hemoglobin 12.0 - 15.0 g/dL 86.4  84.3  85.5   Hematocrit 36.0 - 46.0 % 38.6  46.4  44.1   Platelets 150 - 400 K/uL 279  78  297        Latest Ref Rng & Units 08/22/2024   11:19 AM 01/23/2020   12:11 PM 08/23/2018    6:31 PM  BMP  Glucose 70 - 99 mg/dL 725  762  853   BUN 8 - 23 mg/dL 29  29  25    Creatinine 0.44 - 1.00 mg/dL 8.21  1.47  1.04   Sodium 135 - 145 mmol/L 133  134  137   Potassium 3.5 - 5.1 mmol/L 3.9  3.9  3.9   Chloride 98 - 111 mmol/L 96  98  106   CO2 22 - 32 mmol/L 25  24  21    Calcium  8.9 - 10.3 mg/dL 89.8  8.4  9.7      Assessment and Plan:  Acute ischemic stroke Acute vertigo likely peripheral MRI of the brain showing small acute infarct in the left subinsular white matter, right corona radiata and the right parietal lobe According to neurologist this vertigo is likely peripheral and stroke is incidental finding. ED physician discussed the case with neurologist Dr. VANESSA, [facility] who will follow-up on patient and consult placed PT OT ST consulted We will keep n.p.o. until bedside swallow eval is completed Follow-up on A1c, lipid panel, echocardiogram Carotid ultrasound Allow permissive hypertension within the first 24 hours Continue aspirin  and Plavix which patient already takes at home   Meningioma This has been chronic since 2019 however size of now increased to 2.2 cm with minimal mass effect but no surrounding brain edema. Patient will need nonemergent neurosurgical follow-up.  Depression   Chronic diastolic congestive heart failure Follow-up on echocardiogram Daily weight Monitor input and output  Essential hypertension,   Coronary artery disease,   Insulin -dependent diabetes mellitus  Chronic seizures  Sleep apnea   Advance Care Planning:   Code  Status: Prior full code  Consults: Neurology  Family Communication: None present at bedside  Severity of Illness: The appropriate patient status for this patient is OBSERVATION. Observation status is judged to be reasonable and necessary in order to provide the required intensity of service to ensure the patient's safety. The patient's presenting symptoms, physical exam findings, and initial radiographic and laboratory data in the context of their medical condition is felt to place them at decreased risk for further clinical deterioration. Furthermore, it is anticipated that the patient will be medically stable for discharge from the hospital within 2 midnights of admission.   Author: Drue ONEIDA Potter, MD 08/22/2024 8:51 PM  For on call review [email or web address].

## 2024-08-22 NOTE — ED Triage Notes (Signed)
 Reports vertigo like dizziness x 3 days. States has hx of vertigo but not lasting this long. Did maneuvers but no relief.

## 2024-08-22 NOTE — ED Notes (Signed)
 Recollect lav sent to lab.

## 2024-08-22 NOTE — ED Triage Notes (Signed)
 Pt sent from [facility] to get an MRI.

## 2024-08-22 NOTE — ED Provider Notes (Signed)
 Patient signed out to me at 1530 by Dr. Levander pending MRI. In short this is a 68 year old female with PMH CHF, HTN, CAD, seizures that presented to the ED with dizziness. She was initially evaluated at [facility] ED and transferred here for MRI.  8:14 PM I spoke with Dr. Vanessa with neurology who states stroke findings are incidental and vertigo likely due to peripheral etiology but does recommend admission for stroke work up.   Kingsley, [place] K, DO 08/22/24 2015

## 2024-08-23 ENCOUNTER — Observation Stay (HOSPITAL_COMMUNITY)

## 2024-08-23 ENCOUNTER — Observation Stay (HOSPITAL_BASED_OUTPATIENT_CLINIC_OR_DEPARTMENT_OTHER)

## 2024-08-23 DIAGNOSIS — E860 Dehydration: Secondary | ICD-10-CM | POA: Diagnosis not present

## 2024-08-23 DIAGNOSIS — R42 Dizziness and giddiness: Secondary | ICD-10-CM

## 2024-08-23 DIAGNOSIS — E1151 Type 2 diabetes mellitus with diabetic peripheral angiopathy without gangrene: Secondary | ICD-10-CM

## 2024-08-23 DIAGNOSIS — N179 Acute kidney failure, unspecified: Secondary | ICD-10-CM | POA: Diagnosis not present

## 2024-08-23 DIAGNOSIS — I6349 Cerebral infarction due to embolism of other cerebral artery: Secondary | ICD-10-CM | POA: Diagnosis not present

## 2024-08-23 DIAGNOSIS — I110 Hypertensive heart disease with heart failure: Secondary | ICD-10-CM | POA: Diagnosis not present

## 2024-08-23 DIAGNOSIS — R29703 NIHSS score 3: Secondary | ICD-10-CM | POA: Diagnosis not present

## 2024-08-23 DIAGNOSIS — E785 Hyperlipidemia, unspecified: Secondary | ICD-10-CM | POA: Diagnosis not present

## 2024-08-23 DIAGNOSIS — R29701 NIHSS score 1: Secondary | ICD-10-CM | POA: Diagnosis not present

## 2024-08-23 DIAGNOSIS — R1310 Dysphagia, unspecified: Secondary | ICD-10-CM | POA: Diagnosis not present

## 2024-08-23 DIAGNOSIS — N1831 Chronic kidney disease, stage 3a: Secondary | ICD-10-CM | POA: Diagnosis not present

## 2024-08-23 DIAGNOSIS — I428 Other cardiomyopathies: Secondary | ICD-10-CM | POA: Diagnosis not present

## 2024-08-23 DIAGNOSIS — G459 Transient cerebral ischemic attack, unspecified: Secondary | ICD-10-CM

## 2024-08-23 DIAGNOSIS — Z7982 Long term (current) use of aspirin: Secondary | ICD-10-CM | POA: Diagnosis not present

## 2024-08-23 DIAGNOSIS — Y92009 Unspecified place in unspecified non-institutional (private) residence as the place of occurrence of the external cause: Secondary | ICD-10-CM | POA: Diagnosis not present

## 2024-08-23 DIAGNOSIS — I2510 Atherosclerotic heart disease of native coronary artery without angina pectoris: Secondary | ICD-10-CM | POA: Diagnosis not present

## 2024-08-23 DIAGNOSIS — R296 Repeated falls: Secondary | ICD-10-CM | POA: Diagnosis not present

## 2024-08-23 DIAGNOSIS — Z8541 Personal history of malignant neoplasm of cervix uteri: Secondary | ICD-10-CM | POA: Diagnosis not present

## 2024-08-23 DIAGNOSIS — I480 Paroxysmal atrial fibrillation: Secondary | ICD-10-CM | POA: Diagnosis not present

## 2024-08-23 DIAGNOSIS — I130 Hypertensive heart and chronic kidney disease with heart failure and stage 1 through stage 4 chronic kidney disease, or unspecified chronic kidney disease: Secondary | ICD-10-CM | POA: Diagnosis not present

## 2024-08-23 DIAGNOSIS — I639 Cerebral infarction, unspecified: Secondary | ICD-10-CM | POA: Diagnosis not present

## 2024-08-23 DIAGNOSIS — Z79899 Other long term (current) drug therapy: Secondary | ICD-10-CM | POA: Diagnosis not present

## 2024-08-23 DIAGNOSIS — I6381 Other cerebral infarction due to occlusion or stenosis of small artery: Secondary | ICD-10-CM | POA: Diagnosis not present

## 2024-08-23 DIAGNOSIS — E1165 Type 2 diabetes mellitus with hyperglycemia: Secondary | ICD-10-CM | POA: Diagnosis not present

## 2024-08-23 DIAGNOSIS — Z794 Long term (current) use of insulin: Secondary | ICD-10-CM | POA: Diagnosis not present

## 2024-08-23 DIAGNOSIS — D320 Benign neoplasm of cerebral meninges: Secondary | ICD-10-CM | POA: Diagnosis not present

## 2024-08-23 DIAGNOSIS — Z8249 Family history of ischemic heart disease and other diseases of the circulatory system: Secondary | ICD-10-CM | POA: Diagnosis not present

## 2024-08-23 DIAGNOSIS — W19XXXA Unspecified fall, initial encounter: Secondary | ICD-10-CM | POA: Diagnosis present

## 2024-08-23 DIAGNOSIS — Z7902 Long term (current) use of antithrombotics/antiplatelets: Secondary | ICD-10-CM | POA: Diagnosis not present

## 2024-08-23 DIAGNOSIS — R569 Unspecified convulsions: Secondary | ICD-10-CM | POA: Diagnosis not present

## 2024-08-23 DIAGNOSIS — I509 Heart failure, unspecified: Secondary | ICD-10-CM

## 2024-08-23 DIAGNOSIS — I420 Dilated cardiomyopathy: Secondary | ICD-10-CM | POA: Diagnosis not present

## 2024-08-23 DIAGNOSIS — I6389 Other cerebral infarction: Secondary | ICD-10-CM | POA: Diagnosis not present

## 2024-08-23 DIAGNOSIS — F32A Depression, unspecified: Secondary | ICD-10-CM | POA: Diagnosis not present

## 2024-08-23 DIAGNOSIS — R29700 NIHSS score 0: Secondary | ICD-10-CM | POA: Diagnosis not present

## 2024-08-23 DIAGNOSIS — I5042 Chronic combined systolic (congestive) and diastolic (congestive) heart failure: Secondary | ICD-10-CM | POA: Diagnosis not present

## 2024-08-23 DIAGNOSIS — Z23 Encounter for immunization: Secondary | ICD-10-CM | POA: Diagnosis not present

## 2024-08-23 DIAGNOSIS — E871 Hypo-osmolality and hyponatremia: Secondary | ICD-10-CM | POA: Diagnosis not present

## 2024-08-23 LAB — ECHOCARDIOGRAM COMPLETE
Calc EF: 31 %
S' Lateral: 3.6 cm
Single Plane A2C EF: 40.2 %
Single Plane A4C EF: 25.9 %

## 2024-08-23 LAB — BASIC METABOLIC PANEL WITH GFR
Anion gap: 12 (ref 5–15)
BUN: 25 mg/dL — ABNORMAL HIGH (ref 8–23)
CO2: 23 mmol/L (ref 22–32)
Calcium: 8.7 mg/dL — ABNORMAL LOW (ref 8.9–10.3)
Chloride: 99 mmol/L (ref 98–111)
Creatinine, Ser: 1.62 mg/dL — ABNORMAL HIGH (ref 0.44–1.00)
GFR, Estimated: 34 mL/min — ABNORMAL LOW (ref >60)
Glucose, Bld: 190 mg/dL — ABNORMAL HIGH (ref 70–99)
Potassium: 3.6 mmol/L (ref 3.5–5.1)
Sodium: 134 mmol/L — ABNORMAL LOW (ref 135–145)

## 2024-08-23 LAB — CBC
HCT: 39.4 % (ref 36.0–46.0)
Hemoglobin: 13.4 g/dL (ref 12.0–15.0)
MCH: 29.1 pg (ref 26.0–34.0)
MCHC: 34 g/dL (ref 30.0–36.0)
MCV: 85.5 fL (ref 80.0–100.0)
Platelets: 271 K/uL (ref 150–400)
RBC: 4.61 MIL/uL (ref 3.87–5.11)
RDW: 12.9 % (ref 11.5–15.5)
WBC: 11.7 K/uL — ABNORMAL HIGH (ref 4.0–10.5)
nRBC: 0 % (ref 0.0–0.2)

## 2024-08-23 LAB — GLUCOSE, CAPILLARY
Glucose-Capillary: 203 mg/dL — ABNORMAL HIGH (ref 70–99)
Glucose-Capillary: 253 mg/dL — ABNORMAL HIGH (ref 70–99)

## 2024-08-23 LAB — CBG MONITORING, ED
Glucose-Capillary: 236 mg/dL — ABNORMAL HIGH (ref 70–99)
Glucose-Capillary: 252 mg/dL — ABNORMAL HIGH (ref 70–99)
Glucose-Capillary: 347 mg/dL — ABNORMAL HIGH (ref 70–99)

## 2024-08-23 MED ORDER — INSULIN ASPART 100 UNIT/ML IJ SOLN
0.0000 [IU] | Freq: Three times a day (TID) | INTRAMUSCULAR | Status: DC
  Administered 2024-08-23 (×2): 3 [IU] via SUBCUTANEOUS
  Administered 2024-08-23: 5 [IU] via SUBCUTANEOUS
  Administered 2024-08-24: 2 [IU] via SUBCUTANEOUS
  Administered 2024-08-24: 5 [IU] via SUBCUTANEOUS
  Administered 2024-08-24: 7 [IU] via SUBCUTANEOUS
  Administered 2024-08-25: 5 [IU] via SUBCUTANEOUS
  Administered 2024-08-25: 3 [IU] via SUBCUTANEOUS
  Administered 2024-08-25 – 2024-08-26 (×2): 5 [IU] via SUBCUTANEOUS
  Administered 2024-08-26: 7 [IU] via SUBCUTANEOUS
  Filled 2024-08-23: qty 5
  Filled 2024-08-23: qty 7
  Filled 2024-08-23: qty 3
  Filled 2024-08-23: qty 5
  Filled 2024-08-23: qty 2
  Filled 2024-08-23: qty 3
  Filled 2024-08-23 (×2): qty 5
  Filled 2024-08-23: qty 7
  Filled 2024-08-23: qty 5
  Filled 2024-08-23: qty 3

## 2024-08-23 MED ORDER — INFLUENZA VAC SPLIT HIGH-DOSE 0.5 ML IM SUSY
0.5000 mL | PREFILLED_SYRINGE | INTRAMUSCULAR | Status: AC
  Administered 2024-08-24: 0.5 mL via INTRAMUSCULAR
  Filled 2024-08-23: qty 0.5

## 2024-08-23 MED ORDER — INSULIN ASPART 100 UNIT/ML IJ SOLN
0.0000 [IU] | Freq: Every day | INTRAMUSCULAR | Status: DC
  Administered 2024-08-23: 3 [IU] via SUBCUTANEOUS
  Administered 2024-08-23: 4 [IU] via SUBCUTANEOUS
  Administered 2024-08-24: 2 [IU] via SUBCUTANEOUS
  Administered 2024-08-25: 3 [IU] via SUBCUTANEOUS
  Filled 2024-08-23: qty 3
  Filled 2024-08-23: qty 2
  Filled 2024-08-23: qty 5

## 2024-08-23 MED ORDER — ATORVASTATIN CALCIUM 40 MG PO TABS
40.0000 mg | ORAL_TABLET | Freq: Every day | ORAL | Status: DC
  Administered 2024-08-23 – 2024-08-24 (×2): 40 mg via ORAL
  Filled 2024-08-23 (×2): qty 1

## 2024-08-23 MED ORDER — PNEUMOCOCCAL 20-VAL CONJ VACC 0.5 ML IM SUSY
0.5000 mL | PREFILLED_SYRINGE | INTRAMUSCULAR | Status: AC
  Administered 2024-08-24: 0.5 mL via INTRAMUSCULAR
  Filled 2024-08-23: qty 0.5

## 2024-08-23 MED ORDER — DEXTROMETHORPHAN-BUPROPION ER 45-105 MG PO TBCR
1.0000 | EXTENDED_RELEASE_TABLET | Freq: Every morning | ORAL | Status: DC
  Administered 2024-08-24 – 2024-08-26 (×3): 1 via ORAL
  Filled 2024-08-23 (×3): qty 1

## 2024-08-23 NOTE — Evaluation (Signed)
 Physical Therapy Evaluation Patient Details Name: Sally Hendrix MRN: [id number] DOB: 06/02/1956 Today's Date: 08/23/2024  History of Present Illness  Sally Hendrix is a 68 y.o. female presented to the ER with dizziness and multiple falls at home. Found to have small acute infarcts in the left subinsular white mater, right corona radiata, and right parietal lobe.As well as increase in size of a now 2.2 cm meningioma. PHMx:depression, CHF, hypertension, coronary artery disease, obesity, type 2 diabetes, seizures, sleep apnea   Clinical Impression  Pt admitted with above diagnosis. PTA pt lived at home with family, independent and driving. Pt currently with functional limitations due to the deficits listed below (see PT Problem List). On eval, pt demo independent bed mobility and transfers. CGA amb 300' without AD. Good sitting/standing balance. BLE strength intact/symmetrical. Pt with primary c/o fatigue. No c/o dizziness at this time. 2 beat L nystagmus noted. Pt will benefit from acute skilled PT to increase their independence and safety with mobility to allow discharge. Post acute, recommend neuro OPPT. No DME needs.         If plan is discharge home, recommend the following: Assist for transportation;Assistance with cooking/housework;Help with stairs or ramp for entrance   Can travel by private vehicle        Equipment Recommendations None recommended by PT  Recommendations for Other Services       Functional Status Assessment Patient has had a recent decline in their functional status and demonstrates the ability to make significant improvements in function in a reasonable and predictable amount of time.     Precautions / Restrictions Precautions Precautions: Fall Recall of Precautions/Restrictions: Intact      Mobility  Bed Mobility Overal bed mobility: Independent                  Transfers Overall transfer level: Independent Equipment used: None                     Ambulation/Gait Ambulation/Gait assistance: Contact guard assist Gait Distance (Feet): 300 Feet Assistive device: None Gait Pattern/deviations: Step-through pattern Gait velocity: WFL Gait velocity interpretation: >2.62 ft/sec, indicative of community ambulatory   General Gait Details: mildly unsteady, no overt LOB  Stairs            Wheelchair Mobility     Tilt Bed    Modified Rankin (Stroke Patients Only)       Balance Overall balance assessment: Needs assistance Sitting-balance support: No upper extremity supported, Feet unsupported Sitting balance-Leahy Scale: Normal     Standing balance support: No upper extremity supported, During functional activity Standing balance-Leahy Scale: Good                               Pertinent Vitals/Pain Pain Assessment Pain Assessment: No/denies pain    Home Living Family/patient expects to be discharged to:: Private residence Living Arrangements: Spouse/significant other;Children;Other relatives Available Help at Discharge: Family;Available PRN/intermittently Type of Home: House Home Access: Stairs to enter Entrance Stairs-Rails: Doctor, [facility] of Steps: 4   Home Layout: One level Home Equipment: Cane - single point      Prior Function Prior Level of Function : Independent/Modified Independent;Driving                     Extremity/Trunk Assessment   Upper Extremity Assessment Upper Extremity Assessment: Overall WFL for tasks assessed    Lower Extremity Assessment Lower  Extremity Assessment: Overall WFL for tasks assessed    Cervical / Trunk Assessment Cervical / Trunk Assessment: Normal  Communication   Communication Communication: No apparent difficulties    Cognition Arousal: Alert Behavior During Therapy: WFL for tasks assessed/performed   PT - Cognitive impairments: Problem solving, Safety/Judgement                         Following  commands: Intact       Cueing Cueing Techniques: Verbal cues     General Comments General comments (skin integrity, edema, etc.): BP 167/100. HR 90. SpO2 100% on RA    Exercises     Assessment/Plan    PT Assessment Patient needs continued PT services  PT Problem List Decreased balance;Decreased mobility;Decreased activity tolerance;Decreased safety awareness       PT Treatment Interventions Therapeutic activities;Gait training;Therapeutic exercise;Patient/family education;Stair training;Balance training;Neuromuscular re-education;Functional mobility training    PT Goals (Current goals can be found in the Care Plan section)  Acute Rehab PT Goals Patient Stated Goal: home PT Goal Formulation: With patient Time For Goal Achievement: 09/06/24 Potential to Achieve Goals: Good    Frequency Min 2X/week     Co-evaluation   Reason for Co-Treatment: For patient/therapist safety;To address functional/ADL transfers PT goals addressed during session: Mobility/safety with mobility;Balance OT goals addressed during session: Strengthening/ROM;ADL's and self-care       AM-PAC PT 6 Clicks Mobility  Outcome Measure Help needed turning from your back to your side while in a flat bed without using bedrails?: None Help needed moving from lying on your back to sitting on the side of a flat bed without using bedrails?: None Help needed moving to and from a bed to a chair (including a wheelchair)?: None Help needed standing up from a chair using your arms (e.g., wheelchair or bedside chair)?: None Help needed to walk in hospital room?: A Little Help needed climbing 3-5 steps with a railing? : A Little 6 Click Score: 22    End of Session Equipment Utilized During Treatment: Gait belt Activity Tolerance: Patient tolerated treatment well Patient left: in bed;with call bell/phone within reach;with family/visitor present Nurse Communication: Mobility status PT Visit Diagnosis: Unsteadiness  on feet (R26.81)    Time: 9099-9079 PT Time Calculation (min) (ACUTE ONLY): 20 min   Charges:   PT Evaluation $PT Eval Moderate Complexity: 1 Mod   PT General Charges $$ ACUTE PT VISIT: 1 Visit         Sari MATSU., PT  Office # [phone]   Erven Sari Shaker 08/23/2024, 10:30 AM

## 2024-08-23 NOTE — ED Notes (Signed)
Patient transported to Vascular 

## 2024-08-23 NOTE — Progress Notes (Signed)
 Carotid duplex  has been completed. Refer to [facility] under chart review to view preliminary results.   08/23/2024  11:32 AM Shamecca Whitebread, Ricka BIRCH

## 2024-08-23 NOTE — Progress Notes (Signed)
  Echocardiogram 2D Echocardiogram has been performed.  Sally Hendrix 08/23/2024, 12:39 PM

## 2024-08-23 NOTE — ED Notes (Signed)
 Pt on hallway bed monitor

## 2024-08-23 NOTE — Evaluation (Signed)
 Occupational Therapy Evaluation and Discharge Patient Details Name: Sally Hendrix MRN: [id number] DOB: 1956-07-06 Today's Date: 08/23/2024   History of Present Illness   Sally Hendrix is a 68 y.o. female presented to the ER with dizziness and multiple falls at home. Found to have small acute infarcts in the left subinsular white mater, right corona radiata, and right parietal lobe.As well as increase in size of a now 2.2 cm meningioma. PHMx:depression, CHF, hypertension, coronary artery disease, obesity, type 2 diabetes, seizures, sleep apnea     Clinical Impressions This 68 yo female admitted with above presents to acute OT with PLOF of being totally independent with all basic ADLs, IADLs, and driving. She currently is at a CGA for ambulation with AD and for ADLs when up on her feet--feel she will progress on her own well from an ADL standpoint. No further OT needs, we will sign off.     If plan is discharge home, recommend the following:   A little help with walking and/or transfers;Assist for transportation;Help with stairs or ramp for entrance     Functional Status Assessment   Patient has had a recent decline in their functional status and demonstrates the ability to make significant improvements in function in a reasonable and predictable amount of time. (without further skilled OT services)     Equipment Recommendations   None recommended by OT      Precautions/Restrictions   Precautions Precautions: Fall Recall of Precautions/Restrictions: Intact Restrictions Weight Bearing Restrictions Per Provider Order: No     Mobility Bed Mobility Overal bed mobility: Independent                  Transfers Overall transfer level: Independent                        Balance Overall balance assessment: Mild deficits observed, not formally tested                                         ADL either performed or assessed with clinical  judgement   ADL                                         General ADL Comments: overall at a CGA when up on her feet without an AD     Vision Baseline Vision/History:  (left eye blind) Patient Visual Report: No change from baseline              Pertinent Vitals/Pain Pain Assessment Pain Assessment: No/denies pain     Extremity/Trunk Assessment Upper Extremity Assessment Upper Extremity Assessment: Overall WFL for tasks assessed           Communication Communication Communication: No apparent difficulties   Cognition Arousal: Alert Behavior During Therapy: WFL for tasks assessed/performed Cognition: No apparent impairments             OT - Cognition Comments: Has difficulty with divided attention                 Following commands: Intact       Cueing  General Comments   Cueing Techniques: Verbal cues  A couple of bobbles that she self corrected           Home Living Family/patient  expects to be discharged to:: Private residence Living Arrangements: Spouse/significant other;Children;Other relatives Available Help at Discharge: Family;Available PRN/intermittently Type of Home: House Home Access: Stairs to enter [employer] of Steps: 4 Entrance Stairs-Rails: Right;Left Home Layout: One level     Bathroom Shower/Tub: It Trainer: Standard     Home Equipment: Cane - single point          Prior Functioning/Environment Prior Level of Function : Independent/Modified Independent;Driving                    OT Problem List: Impaired balance (sitting and/or standing)        OT Goals(Current goals can be found in the care plan section)   Acute Rehab OT Goals Patient Stated Goal: to go to [place] next Wednesday      Co-evaluation PT/OT/SLP Co-Evaluation/Treatment: Yes Reason for Co-Treatment: For patient/therapist safety;To address functional/ADL transfers PT goals  addressed during session: Mobility/safety with mobility;Balance;Strengthening/ROM OT goals addressed during session: Strengthening/ROM;ADL's and self-care      AM-PAC OT 6 Clicks Daily Activity     Outcome Measure Help from another person eating meals?: None Help from another person taking care of personal grooming?: A Little Help from another person toileting, which includes using toliet, bedpan, or urinal?: A Little Help from another person bathing (including washing, rinsing, drying)?: A Little Help from another person to put on and taking off regular upper body clothing?: A Little Help from another person to put on and taking off regular lower body clothing?: A Little 6 Click Score: 19   End of Session Equipment Utilized During Treatment: Gait belt Nurse Communication: Mobility status (pt asking about eating)  Activity Tolerance: Patient tolerated treatment well Patient left:  (on stretcher)  OT Visit Diagnosis: Unsteadiness on feet (R26.81)                Time: 9099-9074 OT Time Calculation (min): 25 min Charges:  OT General Charges $OT Visit: 1 Visit OT Evaluation $OT Eval Moderate Complexity: 1 Mod Cathy L. OT Acute Rehabilitation Services Office [phone]    [place] Dorothyann Distel 08/23/2024, 9:36 AM

## 2024-08-23 NOTE — Plan of Care (Signed)

## 2024-08-23 NOTE — Plan of Care (Signed)
   Problem: Education: Goal: Knowledge of disease or condition will improve Outcome: Progressing

## 2024-08-23 NOTE — Progress Notes (Signed)
 PROGRESS NOTE    Sally Hendrix  FMW:[id number] DOB: 1955/12/14 DOA: 08/22/2024 PCP: Sally Hendrix   Brief Narrative:  This 68 yrs old female with PMH significant for depression, CHF, hypertension, coronary artery disease, obesity, type 2 diabetes, seizures, sleep apnea brought into the ED for dizziness with multiple falls at home.  Yesterday dizziness became more pronounced with associated nausea and vomiting and fall at home necessitating ED visit.  MRI shows small acute infarct in the left subinsular white matter, right corona radiator and right parietal lobe.  2.2 cm meningioma increased in size from 2019.  Patient was admitted for further workup.  Neurology is consulted.  Assessment & Plan:   Principal Problem:   Acute ischemic stroke (HCC)  Acute ischemic stroke : Acute vertigo likely peripheral: MRI of the brain showing small acute infarct in the left subinsular white matter, right corona radiata and the right parietal lobe According to neurologist this vertigo is likely peripheral and stroke is incidental finding. Neurologist recommended stroke workup. Obtain carotid ultrasound, MRI brain. Obtain 2D echocardiogram.   LDL 215, Hemoglobin A1c 11.1 Allow Permissive hypertension for first 24 hours. Patient swallow evaluation at bedside. Continue aspirin  and Plavix for 21 days followed by aspirin  monotherapy. Continue Lipitor  40 mg daily PT and OT evaluation.  Acute kidney injury: Mild hyponatremia likely secondary to dehydration. Patient presented with creatinine 1.78 last known creatinine was 1.4 Continue IV hydration.  Monitor serum creatinine.   Meningioma: This has been chronic since 2019 however size now increased to 2.2 cm with minimal mass effect but no surrounding brain edema. Patient will need nonemergent neurosurgical follow-up.   Chronic diastolic congestive heart failure: Follow-up on echocardiogram Daily weight Monitor input and output   Essential  hypertension: Coronary artery disease Holding antihypertensives at this time on account of permissive hypertension   Insulin -dependent diabetes mellitus: Continue insulin  therapy Monitor glucose level closely.   Chronic seizures: Not on any home medication.    DVT prophylaxis: SCDs Code Status: Full code Family Communication: Daughter at bed side Disposition Plan:    Status is: Inpatient Remains inpatient appropriate because: CVA workup.    Consultants:  Neurology  Procedures:MRI Brain  Antimicrobials:  Anti-infectives (From admission, onward)    None       Subjective: Patient was seen and examined at bedside.  Overnight events noted. Patient reports feeling weak in both lower extremities,  denying any other concerns.   Objective: Vitals:   08/23/24 1254 08/23/24 1305 08/23/24 1440 08/23/24 1451  BP: (!) 175/94  (!) 174/104   Pulse: 89  92   Resp: 19  15   Temp:  98 F (36.7 C) 97.9 F (36.6 C)   TempSrc:  Oral Oral   SpO2: 99%  100%   Weight:    66 kg  Height:    5' 3 (1.6 m)   No intake or output data in the 24 hours ending 08/23/24 1503 Filed Weights   08/23/24 1451  Weight: 66 kg    Examination:  General exam: Appears calm and comfortable, not in any acute distress. Respiratory system: Clear to auscultation. Respiratory effort normal.  RR 14 Cardiovascular system: S1 & S2 heard, RRR. No JVD, murmurs, rubs, gallops or clicks.  Gastrointestinal system: Abdomen is non distended, soft and non tender.  Normal bowel sounds heard. Central nervous system: Alert and oriented x 3. No focal neurological deficits. Extremities: Symmetric 5 x 5 power. Skin: No rashes, lesions or ulcers Psychiatry: Judgement and insight  appear normal. Mood & affect appropriate.     Data Reviewed: I have personally reviewed following labs and imaging studies  CBC: Recent Labs  Lab 08/22/24 1119 08/23/24 0516  WBC 11.1* 11.7*  NEUTROABS 7.4  --   HGB 13.5 13.4   HCT 38.6 39.4  MCV 83.5 85.5  PLT 279 271   Basic Metabolic Panel: Recent Labs  Lab 08/22/24 1119 08/23/24 0516  NA 133* 134*  K 3.9 3.6  CL 96* 99  CO2 25 23  GLUCOSE 274* 190*  BUN 29* 25*  CREATININE 1.78* 1.62*  CALCIUM  10.1 8.7*   GFR: Estimated Creatinine Clearance: 29.9 mL/min (A) (by C-G formula based on SCr of 1.62 mg/dL (H)). Liver Function Tests: Recent Labs  Lab 08/22/24 1119  AST 15  ALT 10  ALKPHOS 130*  BILITOT 0.4  PROT 7.0  ALBUMIN 3.8   No results for input(s): LIPASE, AMYLASE in the last 168 hours. No results for input(s): AMMONIA in the last 168 hours. Coagulation Profile: No results for input(s): INR, PROTIME in the last 168 hours. Cardiac Enzymes: No results for input(s): CKTOTAL, CKMB, CKMBINDEX, TROPONINI in the last 168 hours. BNP (last 3 results) No results for input(s): PROBNP in the last 8760 hours. HbA1C: Recent Labs    08/22/24 2129  HGBA1C 11.1*   CBG: Recent Labs  Lab 08/22/24 1338 08/23/24 0049 08/23/24 0927 08/23/24 1252  GLUCAP 202* 347* 236* 252*   Lipid Profile: Recent Labs    08/22/24 2129  CHOL 322*  HDL 37*  LDLCALC 215*  TRIG 349*  CHOLHDL 8.7   Thyroid  Function Tests: No results for input(s): TSH, T4TOTAL, FREET4, T3FREE, THYROIDAB in the last 72 hours. Anemia Panel: No results for input(s): VITAMINB12, FOLATE, FERRITIN, TIBC, IRON, RETICCTPCT in the last 72 hours. Sepsis Labs: No results for input(s): PROCALCITON, LATICACIDVEN in the last 168 hours.  No results found for this or any previous visit (from the past 240 hours).   Radiology Studies: VAS US  CAROTID (at [facility] and [facility] only) Result Date: 08/23/2024 Carotid Arterial Duplex Study Patient Name:  Sally Hendrix  Date of Exam:   08/23/2024 Medical Rec #: [id number]      Accession #:    [id number] Date of Birth: 10/04/1956       Patient Gender: F Patient Age:   48 years Exam Location:  [facility]  Procedure:      VAS US  CAROTID Referring Phys: DRUE POTTER --------------------------------------------------------------------------------  Indications:       TIA and dizziness with multiple falls. Risk Factors:      Hyperlipidemia, Diabetes, coronary artery disease. Other Factors:     Seizures,sleep apnea and obesity. Comparison Study:  No priors. Performing Technologist: Ricka Sturdivant-Jones RDMS, RVT  Examination Guidelines: A complete evaluation includes B-mode imaging, spectral Doppler, color Doppler, and power Doppler as needed of all accessible portions of each vessel. Bilateral testing is considered an integral part of a complete examination. Limited examinations for reoccurring indications may be performed as noted.  Right Carotid Findings: +----------+--------+--------+--------+------------------+------------------+           PSV cm/sEDV cm/sStenosisPlaque DescriptionComments           +----------+--------+--------+--------+------------------+------------------+ CCA Prox  56      21                                                   +----------+--------+--------+--------+------------------+------------------+  CCA Distal60      18                                                   +----------+--------+--------+--------+------------------+------------------+ ICA Prox  68      20      1-39%                     intimal thickening +----------+--------+--------+--------+------------------+------------------+ ICA Distal68      25                                                   +----------+--------+--------+--------+------------------+------------------+ ECA       112     16                                                   +----------+--------+--------+--------+------------------+------------------+ +----------+--------+-------+----------------+-------------------+           PSV cm/sEDV cmsDescribe        Arm Pressure (mmHG)  +----------+--------+-------+----------------+-------------------+ [id number]             Multiphasic, WNL                    +----------+--------+-------+----------------+-------------------+ +---------+--------+--+--------+--+---------+ VertebralPSV cm/s40EDV cm/s15Antegrade +---------+--------+--+--------+--+---------+  Left Carotid Findings: +----------+--------+--------+--------+------------------+------------------+           PSV cm/sEDV cm/sStenosisPlaque DescriptionComments           +----------+--------+--------+--------+------------------+------------------+ CCA Prox  63      19              calcific                             +----------+--------+--------+--------+------------------+------------------+ CCA Distal79      27                                                   +----------+--------+--------+--------+------------------+------------------+ ICA Prox  61      26      1-39%                     intimal thickening +----------+--------+--------+--------+------------------+------------------+ ICA Distal55      26                                                   +----------+--------+--------+--------+------------------+------------------+ ECA       59      18                                                   +----------+--------+--------+--------+------------------+------------------+ +----------+--------+--------+----------------+-------------------+  PSV cm/sEDV cm/sDescribe        Arm Pressure (mmHG) +----------+--------+--------+----------------+-------------------+ [id number]              Multiphasic, WNL                    +----------+--------+--------+----------------+-------------------+ +---------+--------+--+--------+--+---------+ VertebralPSV cm/s42EDV cm/s19Antegrade +---------+--------+--+--------+--+---------+   Summary: Right Carotid: Velocities in the right ICA are consistent with a 1-39% stenosis.  Left Carotid: Velocities in the left ICA are consistent with a 1-39% stenosis. Vertebrals:  Bilateral vertebral arteries demonstrate antegrade flow. Subclavians: Normal flow hemodynamics were seen in bilateral subclavian              arteries. *See table(s) above for measurements and observations.  Electronically signed by Debby Robertson on 08/23/2024 at 12:44:16 PM.    Final    MR ANGIO HEAD WO CONTRAST Result Date: 08/23/2024 EXAM: MR Angiography Head without intravenous contrast. 08/22/2024 11:56:00 PM TECHNIQUE: Magnetic resonance angiography images of the head without intravenous contrast. Multiplanar 2D and 3D reformatted images are provided for review. COMPARISON: None provided. CLINICAL HISTORY: Neuro deficit, acute, stroke suspected Neuro deficit, acute, stroke suspected FINDINGS: ANTERIOR CIRCULATION: No significant stenosis of the internal carotid arteries. No significant stenosis of the anterior cerebral arteries. No significant stenosis of the middle cerebral arteries. No aneurysm. POSTERIOR CIRCULATION: No significant stenosis of the posterior cerebral arteries. Moderate bilateral proximal P2 PCA stenosis. No significant stenosis of the basilar artery. No significant stenosis of the vertebral arteries. No aneurysm. IMPRESSION: 1. No large vessel occlusion. 2. Moderate bilateral proximal P2 PCA stenosis. Electronically signed by: Gilmore Molt Hendrix 08/23/2024 12:07 AM EST RP Workstation: [id number]   MR BRAIN WO CONTRAST Result Date: 08/22/2024 EXAM: MRI BRAIN WITHOUT CONTRAST 08/22/2024 06:00:03 PM TECHNIQUE: Multiplanar multisequence MRI of the head/brain was performed without the administration of intravenous contrast. COMPARISON: MRI head 05/03/18 CLINICAL HISTORY: Neuro deficit, acute, stroke suspected; dizziness FINDINGS: BRAIN AND VENTRICLES: Small acute infarct in the left subinsular white mater. Small acute infarct in the right corona radiata. Small acute infarct in the right parietal  lobe. No intracranial hemorrhage. No midline shift. No hydrocephalus. Interval increase in size of a now 2.2 x 1.5 cm extraaxial dural based mass along the left frontal convexity (series 5, image 23) that is compatible with a mengioma. Minimal mass effect. No brain edema. Normal flow voids. ORBITS: No acute abnormality. SINUSES AND MASTOIDS: Opacified right maxillary sinus. Trace mastoid effusions. BONES AND SOFT TISSUES: Normal marrow signal. IMPRESSION: 1. Small acute infarcts in the left subinsular white mater, right corona radiata, and right parietal lobe. 2. Since 2019, increase in size of a now 2.2 cm meningioma. Minimal mass effect. No brain edema. 3. Opacified right maxillary sinus. Electronically signed by: Gilmore Molt Hendrix 08/22/2024 06:54 PM EST RP Workstation: [id number]   CT Head Wo Contrast Result Date: 08/22/2024 EXAM: CT HEAD WITHOUT CONTRAST 08/22/2024 11:14:34 AM TECHNIQUE: CT of the head was performed without the administration of intravenous contrast. Automated exposure control, iterative reconstruction, and/or weight based adjustment of the mA/kV was utilized to reduce the radiation dose to as low as reasonably achievable. COMPARISON: Head CT 05/03/2018 and MRI 05/05/2018 CLINICAL HISTORY: Dizziness, falls. Symptoms for 3 days. FINDINGS: BRAIN AND VENTRICLES: There is no evidence of an acute infarct, intracranial hemorrhage, midline shift, hydrocephalus, or extra-axial fluid collection. Cerebral volume is within normal limits for age. An extra-axial mass over the left frontal convexity has enlarged, now measuring 1.9 x 2.3 x 1.3 cm. There is minimal mass effect  on the underlying left frontal brain parenchyma without evidence of edema. A chronic infarct in the anterior body of the corpus callosum to the left of midline is new. Calcified atherosclerosis at the skull base. ORBITS: Bilateral cataract extraction. SINUSES: Chronic right maxillary sinusitis. Small bilateral mastoid effusions.  SOFT TISSUES AND SKULL: No acute soft tissue abnormality. No skull fracture. IMPRESSION: 1. No acute intracranial abnormality. 2. Interval enlargement of a now 2.3 cm left frontal convexity meningioma with minimal mass effect and no edema. 3. Interval chronic infarct in the corpus callosum. Electronically signed by: Dasie Hamburg Hendrix 08/22/2024 11:24 AM EST RP Workstation: [id number]    Scheduled Meds:  aspirin   81 mg Oral Daily   atorvastatin   40 mg Oral Daily   clopidogrel  75 mg Oral Daily   [START ON 08/24/2024] Dextromethorphan-buPROPion ER  1 tablet Oral q morning   insulin  aspart  0-5 Units Subcutaneous QHS   insulin  aspart  0-9 Units Subcutaneous TID WC   Continuous Infusions:  sodium chloride  40 mL/hr at 08/22/24 2141     LOS: 0 days    Time spent: 50 mins    Darcel Dawley, Hendrix [facility]   If 7PM-7AM, please contact night-coverage

## 2024-08-23 NOTE — Progress Notes (Signed)
 STROKE TEAM PROGRESS NOTE    INTERIM HISTORY/SUBJECTIVE Sally Hendrix is a 68 y.o. with hx of uncontrolled T2DM on insulin  therapy, left eye blindness, HTN, CAD s/p DES, CHF, depression, seizure presents with dizziness and falls at home. LKW was unclear on admission.   Evaluated patient with daughter at bedside. States dizziness better but has had generalized weakness. Has had vertigo and tried Eply maneuvers with improvement at home.  States has PCP but only sees him every 2 years or so. Tearful during encounter due to chronic conditions and difficulty managing them. Not taking ASA nor statin therapy.    OBJECTIVE  CBC    Component Value Date/Time   WBC 11.7 (H) 08/23/2024 0516   RBC 4.61 08/23/2024 0516   HGB 13.4 08/23/2024 0516   HCT 39.4 08/23/2024 0516   PLT 271 08/23/2024 0516   MCV 85.5 08/23/2024 0516   MCH 29.1 08/23/2024 0516   MCHC 34.0 08/23/2024 0516   RDW 12.9 08/23/2024 0516   LYMPHSABS 2.8 08/22/2024 1119   MONOABS 0.5 08/22/2024 1119   EOSABS 0.2 08/22/2024 1119   BASOSABS 0.0 08/22/2024 1119    BMET    Component Value Date/Time   NA 134 (L) 08/23/2024 0516   K 3.6 08/23/2024 0516   CL 99 08/23/2024 0516   CO2 23 08/23/2024 0516   GLUCOSE 190 (H) 08/23/2024 0516   BUN 25 (H) 08/23/2024 0516   CREATININE 1.62 (H) 08/23/2024 0516   CALCIUM  8.7 (L) 08/23/2024 0516   GFRNONAA 34 (L) 08/23/2024 0516    IMAGING past 24 hours VAS US  CAROTID (at [facility] and [facility] only) Result Date: 08/23/2024 Carotid Arterial Duplex Study Patient Name:  Sally Hendrix  Date of Exam:   08/23/2024 Medical Rec #: [id number]      Accession #:    [id number] Date of Birth: 09-22-1956       Patient Gender: F Patient Age:   38 years Exam Location:  [facility] Procedure:      VAS US  CAROTID Referring Phys: DRUE POTTER --------------------------------------------------------------------------------  Indications:       TIA and dizziness with multiple falls. Risk Factors:      Hyperlipidemia,  Diabetes, coronary artery disease. Other Factors:     Seizures,sleep apnea and obesity. Comparison Study:  No priors. Performing Technologist: Ricka Sturdivant-Jones RDMS, RVT  Examination Guidelines: A complete evaluation includes B-mode imaging, spectral Doppler, color Doppler, and power Doppler as needed of all accessible portions of each vessel. Bilateral testing is considered an integral part of a complete examination. Limited examinations for reoccurring indications may be performed as noted.  Right Carotid Findings: +----------+--------+--------+--------+------------------+------------------+           PSV cm/sEDV cm/sStenosisPlaque DescriptionComments           +----------+--------+--------+--------+------------------+------------------+ CCA Prox  56      21                                                   +----------+--------+--------+--------+------------------+------------------+ CCA Distal60      18                                                   +----------+--------+--------+--------+------------------+------------------+ ICA Prox  68      20      1-39%                     intimal thickening +----------+--------+--------+--------+------------------+------------------+ ICA Distal68      25                                                   +----------+--------+--------+--------+------------------+------------------+ ECA       112     16                                                   +----------+--------+--------+--------+------------------+------------------+ +----------+--------+-------+----------------+-------------------+           PSV cm/sEDV cmsDescribe        Arm Pressure (mmHG) +----------+--------+-------+----------------+-------------------+ Dlarojcpjw16             Multiphasic, WNL                    +----------+--------+-------+----------------+-------------------+ +---------+--------+--+--------+--+---------+ VertebralPSV cm/s40EDV  cm/s15Antegrade +---------+--------+--+--------+--+---------+  Left Carotid Findings: +----------+--------+--------+--------+------------------+------------------+           PSV cm/sEDV cm/sStenosisPlaque DescriptionComments           +----------+--------+--------+--------+------------------+------------------+ CCA Prox  63      19              calcific                             +----------+--------+--------+--------+------------------+------------------+ CCA Distal79      27                                                   +----------+--------+--------+--------+------------------+------------------+ ICA Prox  61      26      1-39%                     intimal thickening +----------+--------+--------+--------+------------------+------------------+ ICA Distal55      26                                                   +----------+--------+--------+--------+------------------+------------------+ ECA       59      18                                                   +----------+--------+--------+--------+------------------+------------------+ +----------+--------+--------+----------------+-------------------+           PSV cm/sEDV cm/sDescribe        Arm Pressure (mmHG) +----------+--------+--------+----------------+-------------------+ Dlarojcpjw12              Multiphasic, WNL                    +----------+--------+--------+----------------+-------------------+ +---------+--------+--+--------+--+---------+ VertebralPSV cm/s42EDV cm/s19Antegrade +---------+--------+--+--------+--+---------+   Summary: Right  Carotid: Velocities in the right ICA are consistent with a 1-39% stenosis. Left Carotid: Velocities in the left ICA are consistent with a 1-39% stenosis. Vertebrals:  Bilateral vertebral arteries demonstrate antegrade flow. Subclavians: Normal flow hemodynamics were seen in bilateral subclavian              arteries. *See table(s) above for  measurements and observations.  Electronically signed by Debby Robertson on 08/23/2024 at 12:44:16 PM.    Final    MR ANGIO HEAD WO CONTRAST Result Date: 08/23/2024 EXAM: MR Angiography Head without intravenous contrast. 08/22/2024 11:56:00 PM TECHNIQUE: Magnetic resonance angiography images of the head without intravenous contrast. Multiplanar 2D and 3D reformatted images are provided for review. COMPARISON: None provided. CLINICAL HISTORY: Neuro deficit, acute, stroke suspected Neuro deficit, acute, stroke suspected FINDINGS: ANTERIOR CIRCULATION: No significant stenosis of the internal carotid arteries. No significant stenosis of the anterior cerebral arteries. No significant stenosis of the middle cerebral arteries. No aneurysm. POSTERIOR CIRCULATION: No significant stenosis of the posterior cerebral arteries. Moderate bilateral proximal P2 PCA stenosis. No significant stenosis of the basilar artery. No significant stenosis of the vertebral arteries. No aneurysm. IMPRESSION: 1. No large vessel occlusion. 2. Moderate bilateral proximal P2 PCA stenosis. Electronically signed by: Gilmore Molt MD 08/23/2024 12:07 AM EST RP Workstation: [id number]   MR BRAIN WO CONTRAST Result Date: 08/22/2024 EXAM: MRI BRAIN WITHOUT CONTRAST 08/22/2024 06:00:03 PM TECHNIQUE: Multiplanar multisequence MRI of the head/brain was performed without the administration of intravenous contrast. COMPARISON: MRI head 05/03/18 CLINICAL HISTORY: Neuro deficit, acute, stroke suspected; dizziness FINDINGS: BRAIN AND VENTRICLES: Small acute infarct in the left subinsular white mater. Small acute infarct in the right corona radiata. Small acute infarct in the right parietal lobe. No intracranial hemorrhage. No midline shift. No hydrocephalus. Interval increase in size of a now 2.2 x 1.5 cm extraaxial dural based mass along the left frontal convexity (series 5, image 23) that is compatible with a mengioma. Minimal mass effect. No brain  edema. Normal flow voids. ORBITS: No acute abnormality. SINUSES AND MASTOIDS: Opacified right maxillary sinus. Trace mastoid effusions. BONES AND SOFT TISSUES: Normal marrow signal. IMPRESSION: 1. Small acute infarcts in the left subinsular white mater, right corona radiata, and right parietal lobe. 2. Since 2019, increase in size of a now 2.2 cm meningioma. Minimal mass effect. No brain edema. 3. Opacified right maxillary sinus. Electronically signed by: Gilmore Molt MD 08/22/2024 06:54 PM EST RP Workstation: [id number]    Vitals:   08/23/24 0920 08/23/24 0921 08/23/24 1254 08/23/24 1305  BP:  (!) 167/100 (!) 175/94   Pulse: 91 90 89   Resp: (!) 21 (!) 26 19   Temp: 97.9 F (36.6 C)   98 F (36.7 C)  TempSrc: Oral   Oral  SpO2: 99% 100% 99%      PHYSICAL EXAM General:  Alert, patient in no acute distress Psych:  Anxious and tearful during encounter CV: Regular rate and rhythm on monitor Respiratory:  Regular, unlabored respirations on room air   NEURO:  Mental Status: AA&Ox3, patient is able to give clear and coherent history Speech/Language: speech is without dysarthria or aphasia.   Cranial Nerves:  II: PERRL on right eye, reports hx of blindness of left eye and unreactive  III, IV, VI: EOMI. Eyelids elevate symmetrically. No nystagmus noted.  V: Sensation is intact to light touch and symmetrical to face.  VII: Face is symmetrical resting and smiling VIII: hearing intact to voice. IX, X: Palate elevates symmetrically. Phonation is  normal.  KP:Dynloizm shrug 5/5. XII: tongue is midline without fasciculations. Motor: 5/5 strength to all muscle groups tested.  Slight diminished fine finger movements on the left.  Orbits right over left upper extremity. Tone: is normal and bulk is normal Sensation- Intact to light touch bilaterally. Extinction absent to light touch to DSS.   Coordination: FTN intact bilaterally, HKS intact bilaterally. No drift.  Gait- deferred  Most  Recent NIH 0     ASSESSMENT/PLAN  Ms. JEVAEH SHAMS is a 68 y.o. female with history of uncontrolled T2DM on insulin  therapy, left eye blindness, HTN, CAD s/p DES, CHF, depression, seizure admitted for multiple cortical infarcts.  NIH on Admission 0  Acute Ischemic Infarct:  L subinsular, right corona radiata and right parietal lobe infarcts   Etiology:  suspect small vessel disease with multiple uncontrolled risk factors CT head No acute abnormality. Chronic infarct in corpus callosum. Chronic L frontal meningioma now at 2.3 cm w/ minimal mass effect and no edema. MRI  acute infarcts in L subinsular, R corona radiata and R parietal lobe, also noted 2.2 cm meningioma slightly increased MRA  No LVO w/ moderate b/l P2 PCA stenosis  Carotid Doppler  no significant stenosis  2D Echo pending LDL 215 HgbA1c 11.1 VTE prophylaxis - start today  No antithrombotic prior to admission, now on aspirin  81 mg daily and clopidogrel 75 mg daily for 3 weeks and then ASA alone. Therapy recommendations:  Outpatient PT Disposition:  pending, likely home   Left frontal meningioma  Noted since 2019, size increased to 2.2 cm from 1.9 cm Minimal mass effect but no edema  Hx of peripheral vertigo Recommend vestibular PT for evaluation   Hypertension Home meds:  Coreg  37.5 mg BID Stable Blood Pressure Goal: BP less than 220/110 for 24-48 hours from admit  Hyperlipidemia Home meds:  None LDL 215, goal < 70 Added atorvastatin  40 mg daily Discussed about Leqvio, patient agreeable to complete form  Continue statin at discharge  Diabetes type II Uncontrolled on insulin  therapy Home meds:  Novolin 70/30 (query adherence at home) HgbA1c 11.1, goal < 7.0 CBGs  SSI Recommend consult with diabetes coordinator to optimize insulin  regimen before discharge  Recommend close follow-up with PCP for better DM control   Dysphagia SLP consulted     Diet   DIET DYS 3 Room service appropriate? Yes; Fluid  consistency: Thin  Advance diet as tolerated  Other Stroke Risk Factors Coronary artery disease: LHC 2018 s/p DES of prox LAD, not on ADA or statin therapy, taking BB Hx of CHF on home Lasix , last echo 2018 EF 50-55% but LHC 8018 noted 40% Obstructive sleep apnea  Other Active Problems AKI: deferred to primary team    Hospital day # 0  I have personally obtained history,examined this patient, reviewed notes, independently viewed imaging studies, participated in medical decision making and plan of care.ROS completed by me personally and pertinent positives fully documented  I have made any additions or clarifications directly to the above note. Agree with note above.  Patient with multiple uncontrolled risk factors and noncompliance with medications and medical follow-up presented with dizziness and generalized weakness and MRI scan shows multiple lacunar infarcts likely from small vessel disease.  Recommend aspirin  and Plavix for 3 weeks followed by aspirin  alone and aggressive risk factor modification.  Patient counseled to be compliant with taking her medications and with medical follow-up. Discussion with patient and daughter at the bedside and answered questions.   I personally  spent a total of 50 minutes in the care of the patient today including getting/reviewing separately obtained history, performing a medically appropriate exam/evaluation, counseling and educating, placing orders, referring and communicating with other health care professionals, documenting clinical information in the EHR, independently interpreting results, and coordinating care.        Eather Popp, MD Medical Director [facility] Pager: [phone] 08/23/2024 5:35 PM   To contact Stroke Continuity provider, please refer to [email or web address]. After hours, contact General Neurology

## 2024-08-23 NOTE — TOC Initial Note (Signed)
 Transition of Care [facility]) - Initial/Assessment Note    Patient Details  Name: Sally Hendrix MRN: [id number] Date of Birth: 14-Jan-1956  Transition of [facility]) CM/SW Contact:    [name], RN Phone Number: 08/23/2024, 4:07 PM  Clinical Narrative:                  CC: Acute ischemic stroke   Patient comes from home, reports she lives with multiple family members, who provide adequate support. Family will provide transportation home and to appts, and medication management assistance. Patient does report not taking most of her meds as prescribed because she doesn't like to take so many medications. CM educated on the importance of taking all meds as prescribed and she verbalizes understanding.  Currently waiting for ST eval.  [facility] referral sent. Info on AVS.   CM will continue to follow.   Expected Discharge Plan: OP Rehab Barriers to Discharge: Continued Medical Work up   Patient Goals and CMS Choice   CMS Medicare.gov Compare Post Acute Care list provided to:: Patient Choice offered to / list presented to : Patient      Expected Discharge Plan and Services       Living arrangements for the past 2 months: Single Family Home                                      Prior Living Arrangements/Services Living arrangements for the past 2 months: Single Family Home Lives with:: Self, Adult Children, Significant Other Patient language and need for interpreter reviewed:: Yes Do you feel safe going back to the place where you live?: Yes      Need for Family Participation in Patient Care: Yes (Comment) Care giver support system in place?: Yes (comment) Current home services: DME (cane) Criminal Activity/Legal Involvement Pertinent to Current Situation/Hospitalization: No - Comment as needed  Activities of Daily Living   ADL Screening (condition at time of admission) Independently performs ADLs?: Yes (appropriate for developmental age) Is the patient deaf  or have difficulty hearing?: No Does the patient have difficulty seeing, even when wearing glasses/contacts?: Yes Does the patient have difficulty concentrating, remembering, or making decisions?: Yes  Permission Sought/Granted                  Emotional Assessment Appearance:: Developmentally appropriate Attitude/Demeanor/Rapport: Engaged Affect (typically observed): Calm Orientation: : Oriented to Self, Oriented to Place, Oriented to  Time, Oriented to Situation Alcohol  / Substance Use: Not Applicable Psych Involvement: No (comment)  Admission diagnosis:  Dizziness [R42] Vertigo [R42] Acute ischemic stroke [facility]) [I63.9] Cerebrovascular accident (CVA), unspecified mechanism (HCC) [I63.9] Patient Active Problem List   Diagnosis Date Noted   Acute ischemic stroke (HCC) 08/22/2024   Endophthalmitis, acute, left 08/23/2018    Class: Present on Admission   Sleep apnea, obstructive 08/23/2018   Seizures (HCC) 05/04/2018   Seizure (HCC) 05/03/2018   Hypomagnesemia 05/03/2018   Hyponatremia 05/03/2018   Non-ST elevation (NSTEMI) myocardial infarction (HCC)    Chest pain 12/09/2016   Uncontrolled type 2 diabetes mellitus with hyperglycemia (HCC) 12/09/2016   Hypertensive urgency 12/09/2016   Chronic systolic heart failure (HCC) 03/31/2011   Essential hypertension 03/31/2011   DM2 (diabetes mellitus, type 2) (HCC) 03/31/2011   PCP:  Yolande Toribio MATSU, MD Pharmacy:   [facility] [address] [place] - [address] [place] [address] Phone:  [phone] Fax: [phone]     Social Drivers of Health (SDOH) Social History: SDOH Screenings   Food Insecurity: No Food Insecurity (08/23/2024)  Housing: Low Risk  (08/23/2024)  Transportation Needs: No Transportation Needs (08/23/2024)  Utilities: Not At Risk (08/23/2024)  Depression (PHQ2-9): Low Risk  (05/31/2020)  Social Connections: Moderately Isolated (08/23/2024)  Tobacco  Use: Medium Risk (08/22/2024)   SDOH Interventions:     Readmission Risk Interventions     No data to display

## 2024-08-24 ENCOUNTER — Telehealth (HOSPITAL_COMMUNITY): Payer: Self-pay

## 2024-08-24 ENCOUNTER — Other Ambulatory Visit (HOSPITAL_COMMUNITY): Payer: Self-pay

## 2024-08-24 DIAGNOSIS — I420 Dilated cardiomyopathy: Secondary | ICD-10-CM

## 2024-08-24 DIAGNOSIS — I6381 Other cerebral infarction due to occlusion or stenosis of small artery: Secondary | ICD-10-CM | POA: Diagnosis not present

## 2024-08-24 DIAGNOSIS — R29701 NIHSS score 1: Secondary | ICD-10-CM | POA: Diagnosis not present

## 2024-08-24 DIAGNOSIS — I639 Cerebral infarction, unspecified: Secondary | ICD-10-CM | POA: Diagnosis not present

## 2024-08-24 DIAGNOSIS — E1151 Type 2 diabetes mellitus with diabetic peripheral angiopathy without gangrene: Secondary | ICD-10-CM | POA: Diagnosis not present

## 2024-08-24 DIAGNOSIS — I110 Hypertensive heart disease with heart failure: Secondary | ICD-10-CM | POA: Diagnosis not present

## 2024-08-24 LAB — MISC LABCORP TEST (SEND OUT): Labcorp test code: 83935

## 2024-08-24 LAB — GLUCOSE, CAPILLARY
Glucose-Capillary: 255 mg/dL — ABNORMAL HIGH (ref 70–99)
Glucose-Capillary: 310 mg/dL — ABNORMAL HIGH (ref 70–99)
Glucose-Capillary: 175 mg/dL — ABNORMAL HIGH (ref 70–99)
Glucose-Capillary: 226 mg/dL — ABNORMAL HIGH (ref 70–99)

## 2024-08-24 LAB — CBC
HCT: 38.1 % (ref 36.0–46.0)
Hemoglobin: 12.7 g/dL (ref 12.0–15.0)
MCH: 28.3 pg (ref 26.0–34.0)
MCHC: 33.3 g/dL (ref 30.0–36.0)
MCV: 85 fL (ref 80.0–100.0)
Platelets: 263 K/uL (ref 150–400)
RBC: 4.48 MIL/uL (ref 3.87–5.11)
RDW: 12.9 % (ref 11.5–15.5)
WBC: 8.9 K/uL (ref 4.0–10.5)
nRBC: 0 % (ref 0.0–0.2)

## 2024-08-24 LAB — PHOSPHORUS: Phosphorus: 4.6 mg/dL (ref 2.5–4.6)

## 2024-08-24 LAB — BASIC METABOLIC PANEL WITH GFR
Anion gap: 14 (ref 5–15)
BUN: 28 mg/dL — ABNORMAL HIGH (ref 8–23)
CO2: 22 mmol/L (ref 22–32)
Calcium: 8.7 mg/dL — ABNORMAL LOW (ref 8.9–10.3)
Chloride: 103 mmol/L (ref 98–111)
Creatinine, Ser: 1.63 mg/dL — ABNORMAL HIGH (ref 0.44–1.00)
GFR, Estimated: 34 mL/min — ABNORMAL LOW (ref >60)
Glucose, Bld: 284 mg/dL — ABNORMAL HIGH (ref 70–99)
Potassium: 3.7 mmol/L (ref 3.5–5.1)
Sodium: 139 mmol/L (ref 135–145)

## 2024-08-24 LAB — MAGNESIUM: Magnesium: 1.9 mg/dL (ref 1.7–2.4)

## 2024-08-24 MED ORDER — ENOXAPARIN SODIUM 30 MG/0.3ML IJ SOSY
30.0000 mg | PREFILLED_SYRINGE | Freq: Every day | INTRAMUSCULAR | Status: DC
  Administered 2024-08-24 – 2024-08-26 (×3): 30 mg via SUBCUTANEOUS
  Filled 2024-08-24 (×3): qty 0.3

## 2024-08-24 MED ORDER — SACUBITRIL-VALSARTAN 24-26 MG PO TABS
1.0000 | ORAL_TABLET | Freq: Two times a day (BID) | ORAL | Status: DC
  Administered 2024-08-24 – 2024-08-26 (×5): 1 via ORAL
  Filled 2024-08-24 (×5): qty 1

## 2024-08-24 MED ORDER — ATORVASTATIN CALCIUM 80 MG PO TABS
80.0000 mg | ORAL_TABLET | Freq: Every day | ORAL | Status: DC
  Administered 2024-08-25 – 2024-08-26 (×2): 80 mg via ORAL
  Filled 2024-08-24 (×2): qty 1

## 2024-08-24 MED ORDER — CARVEDILOL 6.25 MG PO TABS
6.2500 mg | ORAL_TABLET | Freq: Two times a day (BID) | ORAL | Status: DC
  Administered 2024-08-24 – 2024-08-26 (×4): 6.25 mg via ORAL
  Filled 2024-08-24 (×4): qty 1

## 2024-08-24 NOTE — Progress Notes (Signed)
 Physical Therapy Treatment Patient Details Name: Sally Hendrix MRN: [id number] DOB: [birth year] Today's Date: 08/24/2024   History of Present Illness Sally Hendrix is a 68 y.o. female presented to the ER with dizziness and multiple falls at home. Found to have small acute infarcts in the left subinsular white mater, right corona radiata, and right parietal lobe.As well as increase in size of a now 2.2 cm meningioma. PHMx:depression, CHF, hypertension, coronary artery disease, obesity, type 2 diabetes, seizures, sleep apnea    PT Comments  Pt tolerates treatment well, tolerating multiple dynamic balance challenges without considerable loss of balance. Pt does continue to note increased lateral drift when ambulating and encourages the pt to utilize her [facility] at the time of discharge to reduce her risk of falls. Pt reports a history of chronic dizziness, transient in nature. PT is unable to reproduce these symptoms and does not note any nystagmus at this time. This PT is curious if the symptoms could be related to chronic hypertension, as the pt reports they often occur when lying flat or with her head in a dependent position, although not exclusively with these positions. PT recommends outpatient PT follow-up for further dynamic balance training and to further assess vestibular function.    If plan is discharge home, recommend the following: Assist for transportation;Assistance with cooking/housework;Help with stairs or ramp for entrance   Can travel by private vehicle        Equipment Recommendations  None recommended by PT (pt is encouraged to utilize her [facility])    Recommendations for Other Services       Precautions / Restrictions Precautions Precautions: Fall Recall of Precautions/Restrictions: Intact Restrictions Weight Bearing Restrictions Per Provider Order: No     Mobility  Bed Mobility Overal bed mobility: Independent                  Transfers Overall transfer level:  Independent Equipment used: None                    Ambulation/Gait Ambulation/Gait assistance: Supervision Gait Distance (Feet): 350 Feet Assistive device: None Gait Pattern/deviations: Step-through pattern Gait velocity: functional Gait velocity interpretation: 1.31 - 2.62 ft/sec, indicative of limited community ambulator   General Gait Details: pt tolerates multiple dynamic balance challenges including changing step-height and stride length, backward walking, quick changes in direction, all without significant losses of balance.   Stairs Stairs: Yes Stairs assistance: Supervision Stair Management: Two rails, Alternating pattern, Forwards Number of Stairs: 3     Wheelchair Mobility     Tilt Bed    Modified Rankin (Stroke Patients Only) Modified Rankin (Stroke Patients Only) Pre-Morbid Rankin Score: No symptoms Modified Rankin: Moderate disability     Balance Overall balance assessment: Needs assistance Sitting-balance support: No upper extremity supported, Feet supported Sitting balance-Leahy Scale: Good     Standing balance support: No upper extremity supported, During functional activity Standing balance-Leahy Scale: Good                              Communication Communication Communication: No apparent difficulties  Cognition Arousal: Alert Behavior During Therapy: WFL for tasks assessed/performed   PT - Cognitive impairments: Memory, Problem solving                         Following commands: Intact      Cueing Cueing Techniques: Verbal cues  Exercises  General Comments General comments (skin integrity, edema, etc.): pt continues to have hypertension, SBP exceeding 200 after completion of mobility. NT and RN are aware. PT performs brief vestibular assessment, pursuits and saccades appear WFL, VOR is WFL within normal range, pt with difficult maintaining gaze near end range but performs better after cueing for  smaller head turns.      Pertinent Vitals/Pain Pain Assessment Pain Assessment: No/denies pain    Home Living                          Prior Function            PT Goals (current goals can now be found in the care plan section) Acute Rehab PT Goals Patient Stated Goal: home Progress towards PT goals: Progressing toward goals    Frequency    Min 2X/week      PT Plan      Co-evaluation              AM-PAC PT 6 Clicks Mobility   Outcome Measure  Help needed turning from your back to your side while in a flat bed without using bedrails?: None Help needed moving from lying on your back to sitting on the side of a flat bed without using bedrails?: None Help needed moving to and from a bed to a chair (including a wheelchair)?: None Help needed standing up from a chair using your arms (e.g., wheelchair or bedside chair)?: None Help needed to walk in hospital room?: A Little Help needed climbing 3-5 steps with a railing? : A Little 6 Click Score: 22    End of Session Equipment Utilized During Treatment: Gait belt Activity Tolerance: Patient tolerated treatment well Patient left: in bed;with call bell/phone within reach;with family/visitor present Nurse Communication: Mobility status PT Visit Diagnosis: Unsteadiness on feet (R26.81)     Time: 8463-8442 PT Time Calculation (min) (ACUTE ONLY): 21 min  Charges:    $Gait Training: 8-22 mins PT General Charges $$ ACUTE PT VISIT: 1 Visit                     Bernardino JINNY Ruth, PT, DPT Acute Rehabilitation Office [phone]    Bernardino JINNY Ruth 08/24/2024, 4:18 PM

## 2024-08-24 NOTE — Progress Notes (Signed)
 STROKE TEAM PROGRESS NOTE    INTERIM HISTORY/SUBJECTIVE Sally Hendrix is a 68 y.o. with hx of uncontrolled T2DM on insulin  therapy, left eye blindness, HTN, CAD s/p DES, CHF, depression, seizure presents with dizziness and falls at home. LKW was unclear on admission.   Evaluated patient with daughter at bedside. Dizziness has improved and able to ambulate today. Weakness slowly improving. Extensive discussion of risk stratification and close f/u with PCP. Patient verbalizes understanding.   Echo: EF 30-35% w/ global hypokinesis, no interatrial shunt detected  Vasc carotid: no significant stenosis b/l    OBJECTIVE  CBC    Component Value Date/Time   WBC 8.9 08/24/2024 0426   RBC 4.48 08/24/2024 0426   HGB 12.7 08/24/2024 0426   HCT 38.1 08/24/2024 0426   PLT 263 08/24/2024 0426   MCV 85.0 08/24/2024 0426   MCH 28.3 08/24/2024 0426   MCHC 33.3 08/24/2024 0426   RDW 12.9 08/24/2024 0426   LYMPHSABS 2.8 08/22/2024 1119   MONOABS 0.5 08/22/2024 1119   EOSABS 0.2 08/22/2024 1119   BASOSABS 0.0 08/22/2024 1119    BMET    Component Value Date/Time   NA 139 08/24/2024 0426   K 3.7 08/24/2024 0426   CL 103 08/24/2024 0426   CO2 22 08/24/2024 0426   GLUCOSE 284 (H) 08/24/2024 0426   BUN 28 (H) 08/24/2024 0426   CREATININE 1.63 (H) 08/24/2024 0426   CALCIUM  8.7 (L) 08/24/2024 0426   GFRNONAA 34 (L) 08/24/2024 0426    IMAGING past 24 hours ECHOCARDIOGRAM COMPLETE Result Date: 08/23/2024    ECHOCARDIOGRAM REPORT   Patient Name:   Sally Hendrix Date of Exam: 08/23/2024 Medical Rec #:  [id number]     Height:       62.5 in Accession #:    [id number]    Weight:       149.1 lb Date of Birth:  [birth year]      BSA:          1.698 m Patient Age:    68 years      BP:           167/100 mmHg Patient Gender: F             HR:           89 bpm. Exam Location:  Inpatient Procedure: 2D Echo and Saline Contrast Bubble Study (Both Spectral and Color            Flow Doppler were utilized during  procedure). Indications:    stroke  History:        Patient has no prior history of Echocardiogram examinations.                 CHF, Previous Myocardial Infarction, Stroke,                 Signs/Symptoms:Chest Pain; Risk Factors:Diabetes and                 Hypertension.  Sonographer:    Melissa Morford RDCS (AE, PE) Referring Phys: [phone] PRINCE T DJAN IMPRESSIONS  1. Left ventricular ejection fraction, by estimation, is 30 to 35%. The left ventricle has moderately decreased function. The left ventricle demonstrates global hypokinesis. There is mild left ventricular hypertrophy. Left ventricular diastolic parameters are indeterminate.  2. Right ventricular systolic function is normal. The right ventricular size is normal.  3. Trivial mitral valve regurgitation.  4. The aortic valve is tricuspid. Aortic valve regurgitation is not visualized. FINDINGS  Left Ventricle: Left ventricular ejection fraction, by estimation, is 30 to 35%. The left ventricle has moderately decreased function. The left ventricle demonstrates global hypokinesis. The left ventricular internal cavity size was normal in size. There is mild left ventricular hypertrophy. Left ventricular diastolic parameters are indeterminate. Right Ventricle: The right ventricular size is normal. Right vetricular wall thickness was not assessed. Right ventricular systolic function is normal. Left Atrium: Left atrial size was normal in size. Right Atrium: Right atrial size was normal in size. Pericardium: There is no evidence of pericardial effusion. Mitral Valve: There is mild thickening of the mitral valve leaflet(s). Mild mitral annular calcification. Trivial mitral valve regurgitation. Tricuspid Valve: The tricuspid valve is normal in structure. Tricuspid valve regurgitation is mild. Aortic Valve: The aortic valve is tricuspid. Aortic valve regurgitation is not visualized. Pulmonic Valve: The pulmonic valve was not well visualized. Pulmonic valve  regurgitation is trivial. Aorta: The aortic root is normal in size and structure. IAS/Shunts: No atrial level shunt detected by color flow Doppler. Agitated saline contrast was given intravenously to evaluate for intracardiac shunting.  LEFT VENTRICLE PLAX 2D LVIDd:         4.10 cm LVIDs:         3.60 cm LV PW:         1.30 cm LV IVS:        1.20 cm LVOT diam:     2.00 cm LV SV:         33 LV SV Index:   20 LVOT Area:     3.14 cm  LV Volumes (MOD) LV vol d, MOD A2C: 100.0 ml LV vol d, MOD A4C: 108.0 ml LV vol s, MOD A2C: 59.8 ml LV vol s, MOD A4C: 80.0 ml LV SV MOD A2C:     40.2 ml LV SV MOD A4C:     108.0 ml LV SV MOD BP:      32.1 ml RIGHT VENTRICLE RV S prime:     8.59 cm/s LEFT ATRIUM           Index        RIGHT ATRIUM          Index LA diam:      2.40 cm 1.41 cm/m   RA Area:     9.28 cm LA Vol (A2C): 46.8 ml 27.57 ml/m  RA Volume:   17.30 ml 10.19 ml/m LA Vol (A4C): 28.3 ml 16.67 ml/m  AORTIC VALVE LVOT Vmax:   66.20 cm/s LVOT Vmean:  47.200 cm/s LVOT VTI:    0.106 m  AORTA Ao Root diam: 3.10 cm  SHUNTS Systemic VTI:  0.11 m Systemic Diam: 2.00 cm Vina Gull MD Electronically signed by Vina Gull MD Signature Date/Time: 08/23/2024/4:01:12 PM    Final    VAS US  CAROTID (at [facility] and [facility] only) Result Date: 08/23/2024 Carotid Arterial Duplex Study Patient Name:  Sally Hendrix  Date of Exam:   08/23/2024 Medical Rec #: [id number]      Accession #:    [id number] Date of Birth: November 14, 1955       Patient Gender: F Patient Age:   48 years Exam Location:  [facility] Procedure:      VAS US  CAROTID Referring Phys: DRUE POTTER --------------------------------------------------------------------------------  Indications:       TIA and dizziness with multiple falls. Risk Factors:      Hyperlipidemia, Diabetes, coronary artery disease. Other Factors:     Seizures,sleep apnea and obesity. Comparison Study:  No  priors. Performing Technologist: Ricka Sturdivant-Jones RDMS, RVT  Examination Guidelines: A complete  evaluation includes B-mode imaging, spectral Doppler, color Doppler, and power Doppler as needed of all accessible portions of each vessel. Bilateral testing is considered an integral part of a complete examination. Limited examinations for reoccurring indications may be performed as noted.  Right Carotid Findings: +----------+--------+--------+--------+------------------+------------------+           PSV cm/sEDV cm/sStenosisPlaque DescriptionComments           +----------+--------+--------+--------+------------------+------------------+ CCA Prox  56      21                                                   +----------+--------+--------+--------+------------------+------------------+ CCA Distal60      18                                                   +----------+--------+--------+--------+------------------+------------------+ ICA Prox  68      20      1-39%                     intimal thickening +----------+--------+--------+--------+------------------+------------------+ ICA Distal68      25                                                   +----------+--------+--------+--------+------------------+------------------+ ECA       112     16                                                   +----------+--------+--------+--------+------------------+------------------+ +----------+--------+-------+----------------+-------------------+           PSV cm/sEDV cmsDescribe        Arm Pressure (mmHG) +----------+--------+-------+----------------+-------------------+ [id number]             Multiphasic, WNL                    +----------+--------+-------+----------------+-------------------+ +---------+--------+--+--------+--+---------+ VertebralPSV cm/s40EDV cm/s15Antegrade +---------+--------+--+--------+--+---------+  Left Carotid Findings: +----------+--------+--------+--------+------------------+------------------+           PSV cm/sEDV  cm/sStenosisPlaque DescriptionComments           +----------+--------+--------+--------+------------------+------------------+ CCA Prox  63      19              calcific                             +----------+--------+--------+--------+------------------+------------------+ CCA Distal79      27                                                   +----------+--------+--------+--------+------------------+------------------+ ICA Prox  61      26      1-39%  intimal thickening +----------+--------+--------+--------+------------------+------------------+ ICA Distal55      26                                                   +----------+--------+--------+--------+------------------+------------------+ ECA       59      18                                                   +----------+--------+--------+--------+------------------+------------------+ +----------+--------+--------+----------------+-------------------+           PSV cm/sEDV cm/sDescribe        Arm Pressure (mmHG) +----------+--------+--------+----------------+-------------------+ Dlarojcpjw12              Multiphasic, WNL                    +----------+--------+--------+----------------+-------------------+ +---------+--------+--+--------+--+---------+ VertebralPSV cm/s42EDV cm/s19Antegrade +---------+--------+--+--------+--+---------+   Summary: Right Carotid: Velocities in the right ICA are consistent with a 1-39% stenosis. Left Carotid: Velocities in the left ICA are consistent with a 1-39% stenosis. Vertebrals:  Bilateral vertebral arteries demonstrate antegrade flow. Subclavians: Normal flow hemodynamics were seen in bilateral subclavian              arteries. *See table(s) above for measurements and observations.  Electronically signed by Debby Robertson on 08/23/2024 at 12:44:16 PM.    Final     Vitals:   08/23/24 1451 08/23/24 2000 08/24/24 0044 08/24/24 0350  BP:  (!)  197/115 (!) 184/97 (!) 180/95  Pulse:  87 94 89  Resp:  16 18 18   Temp:  98.1 F (36.7 C) 97.8 F (36.6 C) 98.1 F (36.7 C)  TempSrc:  Oral Oral   SpO2:  98% 100% 98%  Weight: 66 kg     Height: 5' 3 (1.6 m)        PHYSICAL EXAM General:  Alert, poor dentition noted, patient in no acute distress Psych:  Normal mood and affect  CV: Regular rate and rhythm on monitor Respiratory:  Regular, unlabored respirations on room air   NEURO:  Mental Status: AA&Ox3, patient is able to give clear and coherent history Speech/Language: speech is without dysarthria or aphasia.   Cranial Nerves:  II: PERRL on right eye, reports hx of blindness of left eye and unreactive  III, IV, VI: EOMI. Eyelids elevate symmetrically. No nystagmus noted.  V: Sensation is intact to light touch and symmetrical to face.  VII: Face is symmetrical resting and smiling VIII: hearing intact to voice. IX, X: Palate elevates symmetrically. Phonation is normal.  KP:Dynloizm shrug 5/5. XII: tongue is midline without fasciculations. Motor: 5/5 strength to all muscle groups tested.  Sensation- Intact to light touch bilaterally. Extinction absent to light touch to DSS.   Coordination: FTN intact bilaterally  Gait- deferred  Most Recent NIH 1     ASSESSMENT/PLAN  Sally Hendrix is a 68 y.o. female with history of uncontrolled T2DM on insulin  therapy, left eye blindness, HTN, CAD s/p DES, CHF, depression, seizure admitted for multiple cortical infarcts.  NIH on Admission 0  Acute Ischemic Infarct:  L subinsular, right corona radiata and right parietal lobe infarcts   Etiology: concern if cryptogenic stroke vs small vessel disease w/ multiple uncontrolled risk factors  CT head No acute abnormality. Chronic infarct in corpus callosum. Chronic L frontal meningioma now at 2.3 cm w/ minimal mass effect and no edema. MRI  acute infarcts in L subinsular, R corona radiata and R parietal lobe, also noted 2.2 cm meningioma  slightly increased MRA  No LVO w/ moderate b/l P2 PCA stenosis  Carotid Doppler  no significant stenosis  2D Echo EF 30-35% w/ global hypokinesis, no interatrial shunt detected - cardiology consulted/following Consulted EP for ILR prior to discharge, reach out to their team when close to discharge LDL 215  HgbA1c 11.1 VTE prophylaxis: enoxaparin  (LOVENOX ) injection 30 mg Start: 08/24/24 1200 No antithrombotic prior to admission, now on aspirin  81 mg daily and clopidogrel  75 mg daily for 3 weeks and then ASA alone. Therapy recommendations:  Outpatient PT Disposition:  pending, likely home   Left frontal meningioma  Noted since 2019, size increased to 2.2 cm from 1.9 cm Minimal mass effect but no edema Recommend outpatient f/u with neurosurgery  Hx of peripheral vertigo Recommend vestibular PT for evaluation   Hypertension Home meds:  Coreg  37.5 mg BID Stable Blood Pressure Goal: BP less than 220/110 for 24-48 hours from admit  Hyperlipidemia Home meds:  None LDL 215, goal < 70 Added atorvastatin  40 mg daily Discussed about Leqvio, patient completed form Continue statin at discharge  Diabetes type II Uncontrolled on insulin  therapy Home meds:  Novolin 70/30 (query adherence at home) HgbA1c 11.1, goal < 7.0 CBGs  SSI Recommend consult with diabetes coordinator to optimize insulin  regimen before discharge  Recommend close follow-up with PCP for better DM control   Dysphagia SLP consulted     Diet   DIET DYS 3 Room service appropriate? Yes; Fluid consistency: Thin  Advance diet as tolerated  Other Stroke Risk Factors Coronary artery disease: LHC 2018 s/p DES of prox LAD, not on ADA or statin therapy, taking BB Hx of CHF on home Lasix , last echo 2018 EF 50-55% but LHC 8018 noted 40%, repeat EF 30-35%, cardiology followng Obstructive sleep apnea  Other Active Problems AKI: deferred to primary team    Hospital day # 1  I have personally obtained history,examined this  patient, reviewed notes, independently viewed imaging studies, participated in medical decision making and plan of care.ROS completed by me personally and pertinent positives fully documented  I have made any additions or clarifications directly to the above note. Agree with note above.  Patient is showing improvement.  Recommend dual antiplatelet therapy aspirin  and Plavix  for 3 weeks followed by aspirin  alone and aggressive risk factor modification.  Recommend loop recorder insertion for paroxysmal A-fib and discharge given patient's prior history of not being able to tolerate external cardiac monitor.  Long discussion with patient and daughter at the bedside and answered questions.  Also consider Leqvio injections due to patient's history of noncompliance with statins and significantly elevated LDL.  Follow-up as an outpatient stroke clinic with nurse practitioner in 2 months.   I personally spent a total of 35 minutes in the care of the patient today including getting/reviewing separately obtained history, performing a medically appropriate exam/evaluation, counseling and educating, placing orders, referring and communicating with other health care professionals, documenting clinical information in the EHR, independently interpreting results, and coordinating care.        Eather Popp, MD Medical Director [facility] Pager: [phone] 08/24/2024 2:21 PM   To contact Stroke Continuity provider, please refer to [email or web address]. After hours, contact General Neurology

## 2024-08-24 NOTE — Telephone Encounter (Signed)
 Pharmacy Patient Advocate Encounter  Insurance verification completed.    The patient is insured through [employer]/ [employer]. Patient has Medicare and is not eligible for a copay card, but may be able to apply for patient assistance or Medicare RX Payment Plan (Patient Must reach out to their plan, if eligible for payment plan), if available.    Ran test claim for Entresto 24-26mg  and the current 30 day co-pay is $0.   This test claim was processed through [employer]- copay amounts may vary at other pharmacies due to [employer], or as the patient moves through the different stages of their insurance plan.

## 2024-08-24 NOTE — Progress Notes (Signed)
 Chaplain responded to [facility] consult for advance directives. Pt was accompanied by her adult daughter. Pt's physician and RN walked in and were present toward the end of the visit. Chaplain introduced spiritual care and offered support in the setting of inpatient admission.  Sally Hendrix shared that she is legally married, but would likely elect her daughter to function as her health care agent. She indicated that she would likely decline life prolonging measures and is unsure if her spouse would be able to make that decision on her behalf, but has not competed any documentation yet. Sally Hendrix has a copy of the documents at bedside and was educated that spiritual care remains available for further questions, assistance completing documents, or notary assistance when documents are complete. She was instructed to contact her RN if she wishes for the chaplain to return for these purposes or further support.    Detailed summary of AD education provided to pt is below:  Chaplain provided the Advance Directive packet as well as education on Advance Directives-documents an individual completes to communicate their health care directions in advance of a time when they may need them. Chaplain informed pt the documents which may be completed here in the hospital are the Living Will and Health Care Power of [place].   Chaplain informed that the Health Care Power of [place] is a legal document in which an individual names another person, their Health Care Agent, to make health care decisions when the individual is not able to make them for themselves. The Health Care Agent's function can be temporary or permanent depending on the pt's ability to make and communicate those decisions independently. Chaplain informed pt in the absence of a Health Care Power of Attorney, the state of [place]  directs health care providers to look to the following individuals in the order listed: legal guardian; an attorney?in?fact under a  general power of attorney (POA) if that POA includes the right to make health care decisions; a husband or wife; a majority of parents and adult children; a majority of adult brothers and sisters; or an individual who has an established relationship with you, who is acting in good faith and who can convey your wishes.  If none of these person are available or willing to make medical decisions on a patient's behalf, the law allows the patient's doctor to make decisions for them as long as another doctor agrees with those decisions.  Chaplain also informed the patient that the Health Care agent has no decision-making authority over any affairs other than those related to his or her medical care.   The chaplain further educated the pt that a Living Will is a legal document that allows an individual to state his or her desire not to receive life-prolonging measures in the event that they have a condition that is incurable and will result in their death in a short period of time; they are unconscious, and doctors are confident that they will not regain consciousness; and/or they have advanced dementia or other substantial and irreversible loss of mental function. The chaplain informed pt that life-prolonging measures are medical treatments that would only serve to postpone death, including breathing machines, kidney dialysis, antibiotics, artificial nutrition and hydration (tube feeding), and similar forms of treatment and that if an individual is able to express their wishes, they may also make them known without the use of a Living Will, but in the event that an individual is not able to express their wishes themselves, a Living Will allows  medical providers and the pt's family and friends ensure that they are not making decisions on the pt's behalf, but rather serving as the pt's voice to convey decisions the pt has already made.   The patient is aware that the decision to create an advance directive is theirs  alone and they may chose not to complete the documents or may chose to complete one portion or both.  The patient was informed that they can revoke the documents at any time by striking through them and writing void or by completing new documents, but that it is also advisable that the individual verbally notify interested parties that their wishes have changed.  They are also aware that the document must be signed in the presence of a notary public and two witnesses and that this can be done while the patient is still admitted to the hospital or after discharge in the community. If they decide to complete Advance Directives after being discharged from the hospital, they have been advised to notify all interested parties and to provide those documents to their physicians and loved ones in addition to bringing them to the hospital in the event of another hospitalization.   The chaplain informed the pt that if they desire to proceed with completing Advance Directive Documentation while they are still admitted, notary services are typically available at [facility] between the hours of 1:00 and 3:30 Monday-Thursday.    When the patient is ready to have these documents completed, the patient should request that their nurse place a spiritual care consult and indicate that the patient is ready to have their advance directives notarized so that arrangements for witnesses and notary public can be made.  Please page spiritual care if the patient desires further education or has questions.          Alan HERO. Davee Lomax, M.Div. [facility] Chaplain Pager [phone] Office [phone]    08/24/24 [phone]  Spiritual Encounters  Type of Visit Initial  Care provided to: Pt and family  Conversation partners present during encounter Nurse;Physician  Referral source Patient request  Reason for visit Advance directives  OnCall Visit No  Interventions  Spiritual Care Interventions Made Established  relationship of care and support;Compassionate presence  Advance Directives (For Healthcare)  Does Patient Have a Medical Advance Directive? No  Would patient like information on creating a medical advance directive? Yes (Inpatient - patient defers creating a medical advance directive at this time - Information given)

## 2024-08-24 NOTE — Progress Notes (Signed)
  Patient Name: Sally Hendrix Date of Encounter: 08/24/2024  Primary Cardiologist: Redell Shallow, MD Electrophysiologist: None  EP asked to evaluate this patient for possible ILR after CVA (small vessel with uncontrolled risk factors vs cryptogenic etiology). TTE this admission shows LVEF now 30-35%. Patient seen by general cardiology and is being restarted on HF GDMT (previously taking limited GDMT in 2018). Given potential need for ICD implant in near future if EF remains low on GDMT, recommend starting with 30 day event monitor at this time rather than ILR.    SignedArtist Pouch, PA-C  08/24/2024, 2:21 PM

## 2024-08-24 NOTE — Consult Note (Addendum)
 As below, patient seen and examined.  Briefly she is a 68 year old female with past medical history of uncontrolled hypertension, diabetes mellitus, coronary artery disease, obstructive sleep apnea, seizures, reduced LV function improved admitted with CVA for evaluation of cardiomyopathy.  Patient had PCI of the LAD in 2018.  Echocardiogram at that time showed ejection fraction 50 to 55% and mild left atrial enlargement.  She has been readmitted with CVA.  MRA showed no large vessel occlusion with moderate bilateral proximal P2 PCA stenosis.  MRI showed small acute infarcts in the left subinsular white matter, right corona radiata and right parietal lobe.  Carotid Dopplers show 1 to 39% bilateral stenosis.  Echocardiogram showed ejection fraction 30 to 35%, mild left ventricular hypertrophy.  Cardiology now asked to evaluate for LV dysfunction.  Patient has not been taking any medications at home including no aspirin , statin or antihypertensives.  She states her blood pressure at home runs 160/110.  Laboratory showed creatinine 1.63.  Electrocardiogram shows normal sinus rhythm with no ST changes.  1 cardiomyopathy-likely hypertensive mediated as her blood pressure has been uncontrolled at home and is significantly elevated here.  She also has a history of coronary artery disease but denies chest pain.  No history of alcohol  abuse.  Plan to treat medically for now.  Begin Entresto 24/26 twice daily and follow renal function closely with baseline renal insufficiency.  Will also add carvedilol  6.25 mg twice daily.  Will avoid spironolactone  in the setting of renal insufficiency.  Titrate medications as needed to control blood pressure.  Would then plan to repeat echocardiogram 3 months later.  If LV function remains decreased then would need ischemia evaluation.  2 coronary artery disease-patient has a history of coronary disease but denies chest pain.  Resume aspirin  and statin.  3 severe  hyperlipidemia-LDL greater than 200.  Increase Lipitor  to 80 mg daily.  Check lipids and liver as well as LP(a) in 8 weeks.  Goal LDL less than 55.  4 hypertension-add medications as outlined above and advance as needed.  5 diabetes mellitus-needs improved control of glucose.  Per primary care.  6 noncompliance-when I discussed the importance of complying with medical therapy.  She has been taking none of her medications at home.  7 CVA-recent acute CVA-felt by neurology to be likely small vessel disease.  Continue aspirin , Plavix and statin.  Redell Shallow, MD      Cardiology Consultation   Patient ID: Sally Hendrix MRN: [id number]; DOB: [birth year]  Admit date: 08/22/2024 Date of Consult: 08/24/2024  PCP:  Yolande Toribio MATSU, MD   [facility] Providers Cardiologist:  None        Patient Profile: Sally Hendrix is a 68 y.o. female with a hx of hypertension, HFimEF [EF 15% in 2011 with recovery to 60% in 2012] -NICM, CAD, type 2 diabetes, obesity, seizures, OSA, and depression who is being seen 08/24/2024 for the evaluation of heart failure exacerbation at the request of Laurance Dawley MD.  History of Present Illness: Ms. Pundt was diagnosed with acute heart failure (EF 15%) in 2011 in the setting of elevated blood pressure.  Underwent a left heart cath that showed normal coronaries.  She was diuresed and started on medical therapy with improvement in her EF to 60%. She was lost to follow-up form 2012 -2018 when she presented to the hospital with an NSTEMI.  She underwent cardiac catheterization at that time with intervention with a DES  to the proximal LAD. The diagonal was jailed by the stent with loss of flow.  She did not have chest pain or EKG changes following.  She was seen by Dr. Robby in outpatient 12/2016 for hospital follow up, at that time patient was doing well, still having elevated blood pressure.  She was recommended to follow-up in 3 months, however she was  lost to follow-up.  Patient confirmed she has not seen a cardiologist since this time.  Last echocardiogram was in 2018 showed LVEF 50-55%.  Presented to the hospital on 11/17 for dizziness with associated nausea and vomiting leading to a fall at home. BP 160/102  [systolic did > 200] ECG sinus rhythm VR 99 CT head showed no acute pathology, though aggressive left frontal meningioma with no mass effect.  Chronic infarct of the corpus callosum. MRI head showed multiple small acute infarcts and increased size of meningioma.  Moderate bilateral PCA stenosis. Carotid ultrasound showed mild disease bilaterally Echocardiogram showed LVEF 30 to 35% with global hypokinesis.  Mild LVH.  Pertinent lab work Creatinine ~1.6/7 A1c 11.1%  She has been started on DAPT and Lipitor  40.  She has been given IV fluids.  On interview, patient shared that she was taking few medications prior to admission.  Shares she was taking 1 medication for her depression and was occasionally taking Lasix .  She had taken Lasix  2 days prior to presenting to hospital admission. Has noted some shortness of breath, she had that this was sometimes with exertion and sometimes at rest found that it improved with deep breaths.  She does sometimes have chest pain that is very brief described as sharp and fleeting.  Has noticed being more fatigued, though shared over the last year she started slowing down her activities. Denied orthopnea, PND, and peripheral edema. No recent illness  She did share that she remembers a time when she had a virus that affected her heart and kidneys which caused her EF to drop less than 10%.  She then had EF recovery.  Past Medical History:  Diagnosis Date   Cervical cancer (HCC) 1980s   CHF (congestive heart failure) (HCC)    Depression    all my life (05/03/2018)   Glaucoma    History of kidney stones    Hypertension    Myocardial infarction (HCC) 12/10/2016   Nonischemic cardiomyopathy (HCC)     Obesity    Seizure (HCC) 05/02/2018 X2-3   Sleep apnea    not dx'd (05/03/2018)   Type II diabetes mellitus (HCC)     Past Surgical History:  Procedure Laterality Date   CARDIAC CATHETERIZATION     CATARACT EXTRACTION Bilateral    CERVIX LESION DESTRUCTION  1980s   for cancer   CORONARY STENT INTERVENTION N/A 12/10/2016   Procedure: Coronary Stent Intervention;  Surgeon: Deatrice DELENA Cage, MD;  Location: [facility] INVASIVE CV [facility];  Service: Cardiovascular;  Laterality: N/A;   LEFT HEART CATH AND CORONARY ANGIOGRAPHY N/A 12/10/2016   Procedure: Left Heart Cath and Coronary Angiography;  Surgeon: Deatrice DELENA Cage, MD;  Location: [facility] CV [facility];  Service: Cardiovascular;  Laterality: N/A;   PARS PLANA VITRECTOMY Left 08/23/2018   Procedure: PARS PLANA VITRECTOMY 25 GAUGE FOR ENDOPHTHALMITIS;  Surgeon: Elner Arley DELENA, MD;  Location: [facility] OR;  Service: Ophthalmology;  Laterality: Left;   PARS PLANA VITRECTOMY Left 08/26/2018   Procedure: PARS PLANA VITRECTOMY 25 GAUGE FOR ENDOPHTHALMITIS, diagnostic vitreous tap;  Surgeon: Elner Arley DELENA, MD;  Location: [facility] OR;  Service: Ophthalmology;  Laterality: Left;       Scheduled Meds:  aspirin   81 mg Oral Daily   atorvastatin   40 mg Oral Daily   clopidogrel  75 mg Oral Daily   Dextromethorphan-buPROPion ER  1 tablet Oral q morning   insulin  aspart  0-5 Units Subcutaneous QHS   insulin  aspart  0-9 Units Subcutaneous TID WC   Continuous Infusions:  PRN Meds: acetaminophen  **OR** acetaminophen  (TYLENOL ) oral liquid 160 mg/5 mL **OR** acetaminophen , hydrALAZINE , ondansetron  (ZOFRAN ) IV, senna-docusate  Allergies:   No Known Allergies  Social History:   Social History   Socioeconomic History   Marital status: Married    Spouse name: Not on file   Number of children: 6   Years of education: Not on file   Highest education level: Not on file  Occupational History   Occupation: Education Officer, [employer]: [employer]  Tobacco  Use   Smoking status: Former    Current packs/day: 0.00    Types: Cigarettes    Quit date: 1979    Years since quitting: 46.9   Smokeless tobacco: Never   Tobacco comments:    smoked alot for 1 yr  Vaping Use   Vaping status: Never Used  Substance and Sexual Activity   Alcohol  use: Not Currently    Comment: 05/03/2018 nothing in the last 10 yrs; take too much RX   Drug use: No   Sexual activity: Not on file  Other Topics Concern   Not on file  Social History Narrative   Not on file   Social Drivers of Health   Financial Resource Strain: Not on file  Food Insecurity: No Food Insecurity (08/23/2024)   Hunger Vital Sign    Worried About Running Out of Food in the Last Year: Never true    Ran Out of Food in the Last Year: Never true  Transportation Needs: No Transportation Needs (08/23/2024)   PRAPARE - Administrator, Civil Service (Medical): No    Lack of Transportation (Non-Medical): No  Physical Activity: Not on file  Stress: Not on file  Social Connections: Moderately Isolated (08/23/2024)   Social Connection and Isolation Panel    Frequency of Communication with Friends and Family: More than three times a week    Frequency of Social Gatherings with Friends and Family: More than three times a week    Attends Religious Services: Never    Database Administrator or Organizations: No    Attends Banker Meetings: Never    Marital Status: Married  Catering Manager Violence: Not At Risk (08/23/2024)   Humiliation, Afraid, Rape, and Kick questionnaire    Fear of Current or Ex-Partner: No    Emotionally Abused: No    Physically Abused: No    Sexually Abused: No    Family History:   Family History  Problem Relation Age of Onset   Heart failure Mother    Cirrhosis Mother        hx of - died of nonalcoholic cirrhosis   Coronary artery disease Father 72   Heart attack Father    Depression Father    Other Brother        brain tumor   Heart  attack Maternal Grandfather    Colon cancer Paternal Aunt    Pancreatic cancer Cousin    Hyperlipidemia Son      ROS:  Please see the history of present illness.  All other ROS reviewed  and negative.     Physical Exam/Data: Vitals:   08/23/24 2000 08/24/24 0044 08/24/24 0350 08/24/24 0811  BP: (!) 197/115 (!) 184/97 (!) 180/95 (!) 180/100  Pulse: 87 94 89 90  Resp: 16 18 18 18   Temp: 98.1 F (36.7 C) 97.8 F (36.6 C) 98.1 F (36.7 C) 98.1 F (36.7 C)  TempSrc: Oral Oral  Oral  SpO2: 98% 100% 98% 99%  Weight:      Height:        Intake/Output Summary (Last 24 hours) at 08/24/2024 1132 Last data filed at 08/23/2024 1800 Gross per 24 hour  Intake 1052.63 ml  Output --  Net 1052.63 ml      08/23/2024    2:51 PM 05/16/2021    1:33 PM 01/23/2020   11:17 AM  Last 3 Weights  Weight (lbs) 145 lb 8.1 oz 149 lb 2 oz 149 lb  Weight (kg) 66 kg 67.643 kg 67.586 kg     Body mass index is 25.77 kg/m.  General:  Well nourished, well developed, in no acute distress HEENT: normal Neck: no JVD Vascular: Distal pulses 2+ bilaterally Cardiac:  normal S1, S2; RRR; no murmur  Lungs:  clear to auscultation bilaterally, no wheezing, rhonchi or rales  Abd: soft, nontender, no hepatomegaly  Ext: no edema, cold toes with normal cap refill Musculoskeletal:  No deformities, BUE and BLE strength normal and equal Skin: warm and dry  Neuro:  CNs 2-12 intact, no focal abnormalities noted Psych:  Normal affect   EKG:  The EKG was personally reviewed and demonstrates: The HPI Telemetry:  Telemetry was personally reviewed and demonstrates: Sinus rhythm heart rate 90  Relevant CV Studies: Echocardiogram 2018 Study Conclusions   - Left ventricle: The cavity size was normal. Wall thickness was    increased in a pattern of mild LVH. Systolic function was normal.    The estimated ejection fraction was in the range of 50% to 55%.    Wall motion was normal; there were no regional wall motion     abnormalities.  - Left atrium: The atrium was mildly dilated.   Impressions:   - Low normal to mildly reduced LV systolic function; mild LVH;    mild LAE.   LHC 2018 There is moderate left ventricular systolic dysfunction. LV end diastolic pressure is normal. The left ventricular ejection fraction is 35-45% by visual estimate. Ost RPDA lesion, 20 %stenosed. Ost 1st Diag to 1st Diag lesion, 80 %stenosed. Mid LAD lesion, 30 %stenosed. A STENT ELUNIR 3.0 X 17 drug eluting stent was successfully placed. Prox LAD lesion, 80 %stenosed. Post intervention, there is a 0% residual stenosis.   1. Severe one-vessel coronary artery disease with thrombotic plaque rupture in the proximal LAD at the bifurcation of a small diagonal branch which had severe ostial disease. The second diagonal has moderate diffuse disease. 2. Moderately reduced LV systolic function with an EF of 40% with global hypokinesis. Normal LVEDP. 3. Successful angioplasty and drug-eluting stent placement to the proximal LAD. The first diagonal was jailed by the stent with loss of flow. However, the patient had no chest pain or EKG changes. Given that the vessel was diseased to start with and relatively small in diameter, I elected not to rescue this.   Recommendations: Dual antiplatelet therapy for at least one year. Aggressive treatment of risk factors. Resume heart failure medications  Laboratory Data:  Chemistry Recent Labs  Lab 08/22/24 1119 08/23/24 0516 08/24/24 0426  NA 133* 134* 139  K 3.9 3.6 3.7  CL 96* 99 103  CO2 25 23 22   GLUCOSE 274* 190* 284*  BUN 29* 25* 28*  CREATININE 1.78* 1.62* 1.63*  CALCIUM  10.1 8.7* 8.7*  MG  --   --  1.9  GFRNONAA 30* 34* 34*  ANIONGAP 12 12 14     Recent Labs  Lab 08/22/24 1119  PROT 7.0  ALBUMIN 3.8  AST 15  ALT 10  ALKPHOS 130*  BILITOT 0.4   Lipids  Recent Labs  Lab 08/22/24 2129  CHOL 322*  TRIG 349*  HDL 37*  LDLCALC 215*  CHOLHDL 8.7     Hematology Recent Labs  Lab 08/22/24 1119 08/23/24 0516 08/24/24 0426  WBC 11.1* 11.7* 8.9  RBC 4.62 4.61 4.48  HGB 13.5 13.4 12.7  HCT 38.6 39.4 38.1  MCV 83.5 85.5 85.0  MCH 29.2 29.1 28.3  MCHC 35.0 34.0 33.3  RDW 12.6 12.9 12.9  PLT 279 271 263    Radiology/Studies:  ECHOCARDIOGRAM COMPLETE Result Date: 08/23/2024    ECHOCARDIOGRAM REPORT   Patient Name:   SHERRICA NIEHAUS Date of Exam: 08/23/2024 Medical Rec #:  [id number]     Height:       62.5 in Accession #:    [id number]    Weight:       149.1 lb Date of Birth:  [birth year]      BSA:          1.698 m Patient Age:    68 years      BP:           167/100 mmHg Patient Gender: F             HR:           89 bpm. Exam Location:  Inpatient Procedure: 2D Echo and Saline Contrast Bubble Study (Both Spectral and Color            Flow Doppler were utilized during procedure). Indications:    stroke  History:        Patient has no prior history of Echocardiogram examinations.                 CHF, Previous Myocardial Infarction, Stroke,                 Signs/Symptoms:Chest Pain; Risk Factors:Diabetes and                 Hypertension.  Sonographer:    Melissa Morford RDCS (AE, PE) Referring Phys: [phone] PRINCE T DJAN IMPRESSIONS  1. Left ventricular ejection fraction, by estimation, is 30 to 35%. The left ventricle has moderately decreased function. The left ventricle demonstrates global hypokinesis. There is mild left ventricular hypertrophy. Left ventricular diastolic parameters are indeterminate.  2. Right ventricular systolic function is normal. The right ventricular size is normal.  3. Trivial mitral valve regurgitation.  4. The aortic valve is tricuspid. Aortic valve regurgitation is not visualized. FINDINGS  Left Ventricle: Left ventricular ejection fraction, by estimation, is 30 to 35%. The left ventricle has moderately decreased function. The left ventricle demonstrates global hypokinesis. The left ventricular internal cavity size was normal in  size. There is mild left ventricular hypertrophy. Left ventricular diastolic parameters are indeterminate. Right Ventricle: The right ventricular size is normal. Right vetricular wall thickness was not assessed. Right ventricular systolic function is normal. Left Atrium: Left atrial size was normal in size. Right Atrium: Right atrial size was normal in size. Pericardium: There is no evidence of  pericardial effusion. Mitral Valve: There is mild thickening of the mitral valve leaflet(s). Mild mitral annular calcification. Trivial mitral valve regurgitation. Tricuspid Valve: The tricuspid valve is normal in structure. Tricuspid valve regurgitation is mild. Aortic Valve: The aortic valve is tricuspid. Aortic valve regurgitation is not visualized. Pulmonic Valve: The pulmonic valve was not well visualized. Pulmonic valve regurgitation is trivial. Aorta: The aortic root is normal in size and structure. IAS/Shunts: No atrial level shunt detected by color flow Doppler. Agitated saline contrast was given intravenously to evaluate for intracardiac shunting.  LEFT VENTRICLE PLAX 2D LVIDd:         4.10 cm LVIDs:         3.60 cm LV PW:         1.30 cm LV IVS:        1.20 cm LVOT diam:     2.00 cm LV SV:         33 LV SV Index:   20 LVOT Area:     3.14 cm  LV Volumes (MOD) LV vol d, MOD A2C: 100.0 ml LV vol d, MOD A4C: 108.0 ml LV vol s, MOD A2C: 59.8 ml LV vol s, MOD A4C: 80.0 ml LV SV MOD A2C:     40.2 ml LV SV MOD A4C:     108.0 ml LV SV MOD BP:      32.1 ml RIGHT VENTRICLE RV S prime:     8.59 cm/s LEFT ATRIUM           Index        RIGHT ATRIUM          Index LA diam:      2.40 cm 1.41 cm/m   RA Area:     9.28 cm LA Vol (A2C): 46.8 ml 27.57 ml/m  RA Volume:   17.30 ml 10.19 ml/m LA Vol (A4C): 28.3 ml 16.67 ml/m  AORTIC VALVE LVOT Vmax:   66.20 cm/s LVOT Vmean:  47.200 cm/s LVOT VTI:    0.106 m  AORTA Ao Root diam: 3.10 cm  SHUNTS Systemic VTI:  0.11 m Systemic Diam: 2.00 cm Vina Gull MD Electronically signed by Vina Gull MD Signature Date/Time: 08/23/2024/4:01:12 PM    Final    VAS US  CAROTID (at [facility] and [facility] only) Result Date: 08/23/2024 Carotid Arterial Duplex Study Patient Name:  AMBERLIE GAILLARD  Date of Exam:   08/23/2024 Medical Rec #: [id number]      Accession #:    [id number] Date of Birth: 1956-06-12       Patient Gender: F Patient Age:   79 years Exam Location:  [facility] Procedure:      VAS US  CAROTID Referring Phys: DRUE POTTER --------------------------------------------------------------------------------  Indications:       TIA and dizziness with multiple falls. Risk Factors:      Hyperlipidemia, Diabetes, coronary artery disease. Other Factors:     Seizures,sleep apnea and obesity. Comparison Study:  No priors. Performing Technologist: Ricka Sturdivant-Jones RDMS, RVT  Examination Guidelines: A complete evaluation includes B-mode imaging, spectral Doppler, color Doppler, and power Doppler as needed of all accessible portions of each vessel. Bilateral testing is considered an integral part of a complete examination. Limited examinations for reoccurring indications may be performed as noted.  Right Carotid Findings: +----------+--------+--------+--------+------------------+------------------+           PSV cm/sEDV cm/sStenosisPlaque DescriptionComments           +----------+--------+--------+--------+------------------+------------------+ CCA Prox  56  21                                                   +----------+--------+--------+--------+------------------+------------------+ CCA Distal60      18                                                   +----------+--------+--------+--------+------------------+------------------+ ICA Prox  68      20      1-39%                     intimal thickening +----------+--------+--------+--------+------------------+------------------+ ICA Distal68      25                                                    +----------+--------+--------+--------+------------------+------------------+ ECA       112     16                                                   +----------+--------+--------+--------+------------------+------------------+ +----------+--------+-------+----------------+-------------------+           PSV cm/sEDV cmsDescribe        Arm Pressure (mmHG) +----------+--------+-------+----------------+-------------------+ [id number]             Multiphasic, WNL                    +----------+--------+-------+----------------+-------------------+ +---------+--------+--+--------+--+---------+ VertebralPSV cm/s40EDV cm/s15Antegrade +---------+--------+--+--------+--+---------+  Left Carotid Findings: +----------+--------+--------+--------+------------------+------------------+           PSV cm/sEDV cm/sStenosisPlaque DescriptionComments           +----------+--------+--------+--------+------------------+------------------+ CCA Prox  63      19              calcific                             +----------+--------+--------+--------+------------------+------------------+ CCA Distal79      27                                                   +----------+--------+--------+--------+------------------+------------------+ ICA Prox  61      26      1-39%                     intimal thickening +----------+--------+--------+--------+------------------+------------------+ ICA Distal55      26                                                   +----------+--------+--------+--------+------------------+------------------+ ECA       59      18                                                   +----------+--------+--------+--------+------------------+------------------+ +----------+--------+--------+----------------+-------------------+  PSV cm/sEDV cm/sDescribe        Arm Pressure (mmHG) +----------+--------+--------+----------------+-------------------+  [id number]              Multiphasic, WNL                    +----------+--------+--------+----------------+-------------------+ +---------+--------+--+--------+--+---------+ VertebralPSV cm/s42EDV cm/s19Antegrade +---------+--------+--+--------+--+---------+   Summary: Right Carotid: Velocities in the right ICA are consistent with a 1-39% stenosis. Left Carotid: Velocities in the left ICA are consistent with a 1-39% stenosis. Vertebrals:  Bilateral vertebral arteries demonstrate antegrade flow. Subclavians: Normal flow hemodynamics were seen in bilateral subclavian              arteries. *See table(s) above for measurements and observations.  Electronically signed by Debby Robertson on 08/23/2024 at 12:44:16 PM.    Final    MR ANGIO HEAD WO CONTRAST Result Date: 08/23/2024 EXAM: MR Angiography Head without intravenous contrast. 08/22/2024 11:56:00 PM TECHNIQUE: Magnetic resonance angiography images of the head without intravenous contrast. Multiplanar 2D and 3D reformatted images are provided for review. COMPARISON: None provided. CLINICAL HISTORY: Neuro deficit, acute, stroke suspected Neuro deficit, acute, stroke suspected FINDINGS: ANTERIOR CIRCULATION: No significant stenosis of the internal carotid arteries. No significant stenosis of the anterior cerebral arteries. No significant stenosis of the middle cerebral arteries. No aneurysm. POSTERIOR CIRCULATION: No significant stenosis of the posterior cerebral arteries. Moderate bilateral proximal P2 PCA stenosis. No significant stenosis of the basilar artery. No significant stenosis of the vertebral arteries. No aneurysm. IMPRESSION: 1. No large vessel occlusion. 2. Moderate bilateral proximal P2 PCA stenosis. Electronically signed by: Gilmore Molt MD 08/23/2024 12:07 AM EST RP Workstation: [id number]   MR BRAIN WO CONTRAST Result Date: 08/22/2024 EXAM: MRI BRAIN WITHOUT CONTRAST 08/22/2024 06:00:03 PM TECHNIQUE: Multiplanar multisequence  MRI of the head/brain was performed without the administration of intravenous contrast. COMPARISON: MRI head 05/03/18 CLINICAL HISTORY: Neuro deficit, acute, stroke suspected; dizziness FINDINGS: BRAIN AND VENTRICLES: Small acute infarct in the left subinsular white mater. Small acute infarct in the right corona radiata. Small acute infarct in the right parietal lobe. No intracranial hemorrhage. No midline shift. No hydrocephalus. Interval increase in size of a now 2.2 x 1.5 cm extraaxial dural based mass along the left frontal convexity (series 5, image 23) that is compatible with a mengioma. Minimal mass effect. No brain edema. Normal flow voids. ORBITS: No acute abnormality. SINUSES AND MASTOIDS: Opacified right maxillary sinus. Trace mastoid effusions. BONES AND SOFT TISSUES: Normal marrow signal. IMPRESSION: 1. Small acute infarcts in the left subinsular white mater, right corona radiata, and right parietal lobe. 2. Since 2019, increase in size of a now 2.2 cm meningioma. Minimal mass effect. No brain edema. 3. Opacified right maxillary sinus. Electronically signed by: Gilmore Molt MD 08/22/2024 06:54 PM EST RP Workstation: [id number]   CT Head Wo Contrast Result Date: 08/22/2024 EXAM: CT HEAD WITHOUT CONTRAST 08/22/2024 11:14:34 AM TECHNIQUE: CT of the head was performed without the administration of intravenous contrast. Automated exposure control, iterative reconstruction, and/or weight based adjustment of the mA/kV was utilized to reduce the radiation dose to as low as reasonably achievable. COMPARISON: Head CT 05/03/2018 and MRI 05/05/2018 CLINICAL HISTORY: Dizziness, falls. Symptoms for 3 days. FINDINGS: BRAIN AND VENTRICLES: There is no evidence of an acute infarct, intracranial hemorrhage, midline shift, hydrocephalus, or extra-axial fluid collection. Cerebral volume is within normal limits for age. An extra-axial mass over the left frontal convexity has enlarged, now measuring 1.9 x 2.3 x 1.3 cm.  There is minimal mass  effect on the underlying left frontal brain parenchyma without evidence of edema. A chronic infarct in the anterior body of the corpus callosum to the left of midline is new. Calcified atherosclerosis at the skull base. ORBITS: Bilateral cataract extraction. SINUSES: Chronic right maxillary sinusitis. Small bilateral mastoid effusions. SOFT TISSUES AND SKULL: No acute soft tissue abnormality. No skull fracture. IMPRESSION: 1. No acute intracranial abnormality. 2. Interval enlargement of a now 2.3 cm left frontal convexity meningioma with minimal mass effect and no edema. 3. Interval chronic infarct in the corpus callosum. Electronically signed by: Dasie Hamburg MD 08/22/2024 11:24 AM EST RP Workstation: [id number]     Assessment and Plan:  Chronic systolic heart failure Initial diagnosis in 2012 with an EF less than 15% with EF recovery in 2012.  Last echocardiogram showed EF 50-55% in 2018 in the setting of NSTEMI.  Patient has been lost to follow up over the years, has not been on medications.  Echo this admission showed EF 30-35% Still suspect etiology to be secondary to uncontrolled hypertension in setting of medication noncompliance. Currently euvolemic on exam.  Spoke with neurology, as patient is now at the 48-hour mark since admission we can start GDMT.  BP goal < 130/80.   On admission creatinine 1.7, has maintained this creatinine despite IV fluids.  I suspect this is more of a CKD. Will hold off on ischemic evaluation, will start GDMT. Will reassess EF in 3 months, if no recovery of EF despite BP control will reassess coronary anatomy.   Start Coreg  6.25 mg twice daily Start Entresto 24/25 mg BID Will hold on MRA and SGLT2i   Hypertension BP: 195/90 GDMT as above  CAD s/p DES to LAD in 2018 Patient reported chest pain, though atypical.  Did not sound anginal in etiology. Can consider an ischemic evaluation outpatient if symptomatology changes or EF does not  recover on GDMT Patient is now on DAPT per neurology, monotherapy when okay by neurology Coreg  as above On discharge would recommend ensuring she has SL nitroglycerin   Hyperlipidemia LDL 215    HDL 37, elevated TG though suspect lipid panel was not taken fast Increase lipitor  to 80 mg, will need repeat LFTs and lipid panel in 6-8 weeks  Per primary  Acute ischemic CVA with vertigo AKI vs CKD Meningioma T2DM with hyperglycemia Chronic seizure  Risk Assessment/Risk Scores:       [place]  Heart Association (NYHA) Functional Class NYHA Class II/III  For questions or updates, please contact [facility] Please consult [email or web address] for contact info under      Signed, Leontine LOISE Salen, PA-C  08/24/2024 11:32 AM

## 2024-08-24 NOTE — Progress Notes (Signed)
 PROGRESS NOTE    Sally Hendrix  FMW:[id number] DOB: [birth year] DOA: 08/22/2024 PCP: Yolande Toribio MATSU, MD   Brief Narrative:  This 68 yrs old female with PMH significant for depression, CHF, hypertension, coronary artery disease, obesity, type 2 diabetes, seizures, sleep apnea brought into the ED for dizziness with multiple falls at home.  Yesterday dizziness became more pronounced with associated nausea and vomiting and fall at home necessitating ED visit.  MRI shows small acute infarct in the left subinsular white matter, right corona radiator and right parietal lobe.  2.2 cm meningioma increased in size from 2019.  Patient was admitted for further workup.  Neurology is consulted.  Assessment & Plan:   Principal Problem:   Acute ischemic stroke (HCC)  Acute ischemic stroke : Acute vertigo likely peripheral: MRI of the brain showing small acute infarct in the left subinsular white matter, right corona radiata and the right parietal lobe. According to neurologist this vertigo is likely peripheral and stroke is incidental finding. Neurologist recommended stroke workup. MRA : No LVO, Moderate bilateral proximal P2 PCA stenosis.  Carotid Duplex  : Mild stenosis on both sided 1-39% Echo showed LVEF 35-40%, Decreased LV function. LDL 215, Hemoglobin A1c 11.1 Allow Permissive hypertension for first 24 hours. Patient completed swallow evaluation at bedside. Continue aspirin  and Plavix  for 21 days followed by aspirin  monotherapy. Continue Lipitor  40 mg daily PT and OT evaluation.  Acute kidney injury: Mild hyponatremia likely secondary to dehydration. Patient presented with creatinine 1.78,  last known creatinine was 1.4 Continue IV hydration.  Monitor serum creatinine.   Meningioma: This has been chronic since 2019 however size now increased to 2.2 cm with minimal mass effect but no surrounding brain edema. Patient will need nonemergent neurosurgical follow-up.   Chronic combined  systolic and diastolic congestive heart failure: Echocardiogram shows LVEF 35 to 40%.  Left ventricular dysfunction Daily weight Monitor input and output. Cardiology is consulted.   Essential hypertension: Coronary artery disease Holding antihypertensives at this time on account of permissive hypertension.   Insulin -dependent diabetes mellitus: Continue insulin  therapy. Monitor glucose level closely. Diabetic coordinator consulted.   Chronic seizures: Not on any home medication.    DVT prophylaxis: SCDs Code Status: Full code Family Communication: Daughter at bed side. Disposition Plan:    Status is: Inpatient Remains inpatient appropriate because: CVA workup.    Consultants:  Neurology Cardiology  Procedures:MRI Brain  Antimicrobials:  Anti-infectives (From admission, onward)    None       Subjective: Patient was seen and examined at bedside.  Overnight events noted. Patient reports feeling weak in both lower extremities,  denying any other concerns.    Objective: Vitals:   08/24/24 0044 08/24/24 0350 08/24/24 0811 08/24/24 1147  BP: (!) 184/97 (!) 180/95 (!) 180/100 (!) 195/90  Pulse: 94 89 90 93  Resp: 18 18 18 18   Temp: 97.8 F (36.6 C) 98.1 F (36.7 C) 98.1 F (36.7 C) 97.7 F (36.5 C)  TempSrc: Oral  Oral Oral  SpO2: 100% 98% 99% 97%  Weight:      Height:        Intake/Output Summary (Last 24 hours) at 08/24/2024 1303 Last data filed at 08/23/2024 1800 Gross per 24 hour  Intake 1052.63 ml  Output --  Net 1052.63 ml   Filed Weights   08/23/24 1451  Weight: 66 kg    Examination:  General exam: Appears calm and comfortable, not in any acute distress. Respiratory system: CTA Bilaterally. Respiratory effort normal.  RR 14 Cardiovascular system: S1 & S2 heard, RRR. No JVD, murmurs, rubs, gallops or clicks.  Gastrointestinal system: Abdomen is non distended, soft and non tender.  Normal bowel sounds heard. Central nervous system: Alert  and oriented x 3. No focal neurological deficits. Extremities: No edema, no cyanosis, no clubbing. Skin: No rashes, lesions or ulcers Psychiatry: Judgement and insight appear normal. Mood & affect appropriate.     Data Reviewed: I have personally reviewed following labs and imaging studies  CBC: Recent Labs  Lab 08/22/24 1119 08/23/24 0516 08/24/24 0426  WBC 11.1* 11.7* 8.9  NEUTROABS 7.4  --   --   HGB 13.5 13.4 12.7  HCT 38.6 39.4 38.1  MCV 83.5 85.5 85.0  PLT 279 271 263   Basic Metabolic Panel: Recent Labs  Lab 08/22/24 1119 08/23/24 0516 08/24/24 0426  NA 133* 134* 139  K 3.9 3.6 3.7  CL 96* 99 103  CO2 25 23 22   GLUCOSE 274* 190* 284*  BUN 29* 25* 28*  CREATININE 1.78* 1.62* 1.63*  CALCIUM  10.1 8.7* 8.7*  MG  --   --  1.9  PHOS  --   --  4.6   GFR: Estimated Creatinine Clearance: 29.7 mL/min (A) (by C-G formula based on SCr of 1.63 mg/dL (H)). Liver Function Tests: Recent Labs  Lab 08/22/24 1119  AST 15  ALT 10  ALKPHOS 130*  BILITOT 0.4  PROT 7.0  ALBUMIN 3.8   No results for input(s): LIPASE, AMYLASE in the last 168 hours. No results for input(s): AMMONIA in the last 168 hours. Coagulation Profile: No results for input(s): INR, PROTIME in the last 168 hours. Cardiac Enzymes: No results for input(s): CKTOTAL, CKMB, CKMBINDEX, TROPONINI in the last 168 hours. BNP (last 3 results) No results for input(s): PROBNP in the last 8760 hours. HbA1C: Recent Labs    08/22/24 2129  HGBA1C 11.1*   CBG: Recent Labs  Lab 08/23/24 1252 08/23/24 1641 08/23/24 2124 08/24/24 0637 08/24/24 1113  GLUCAP 252* 203* 253* 255* 310*   Lipid Profile: Recent Labs    08/22/24 2129  CHOL 322*  HDL 37*  LDLCALC 215*  TRIG 349*  CHOLHDL 8.7   Thyroid  Function Tests: No results for input(s): TSH, T4TOTAL, FREET4, T3FREE, THYROIDAB in the last 72 hours. Anemia Panel: No results for input(s): VITAMINB12, FOLATE,  FERRITIN, TIBC, IRON, RETICCTPCT in the last 72 hours. Sepsis Labs: No results for input(s): PROCALCITON, LATICACIDVEN in the last 168 hours.  No results found for this or any previous visit (from the past 240 hours).   Radiology Studies: ECHOCARDIOGRAM COMPLETE Result Date: 08/23/2024    ECHOCARDIOGRAM REPORT   Patient Name:   TAKEYSHA BONK Date of Exam: 08/23/2024 Medical Rec #:  [id number]     Height:       62.5 in Accession #:    [id number]    Weight:       149.1 lb Date of Birth:  10/30/1955      BSA:          1.698 m Patient Age:    68 years      BP:           167/100 mmHg Patient Gender: F             HR:           89 bpm. Exam Location:  Inpatient Procedure: 2D Echo and Saline Contrast Bubble Study (Both Spectral and Color  Flow Doppler were utilized during procedure). Indications:    stroke  History:        Patient has no prior history of Echocardiogram examinations.                 CHF, Previous Myocardial Infarction, Stroke,                 Signs/Symptoms:Chest Pain; Risk Factors:Diabetes and                 Hypertension.  Sonographer:    Melissa Morford RDCS (AE, PE) Referring Phys: [phone] PRINCE T DJAN IMPRESSIONS  1. Left ventricular ejection fraction, by estimation, is 30 to 35%. The left ventricle has moderately decreased function. The left ventricle demonstrates global hypokinesis. There is mild left ventricular hypertrophy. Left ventricular diastolic parameters are indeterminate.  2. Right ventricular systolic function is normal. The right ventricular size is normal.  3. Trivial mitral valve regurgitation.  4. The aortic valve is tricuspid. Aortic valve regurgitation is not visualized. FINDINGS  Left Ventricle: Left ventricular ejection fraction, by estimation, is 30 to 35%. The left ventricle has moderately decreased function. The left ventricle demonstrates global hypokinesis. The left ventricular internal cavity size was normal in size. There is mild left  ventricular hypertrophy. Left ventricular diastolic parameters are indeterminate. Right Ventricle: The right ventricular size is normal. Right vetricular wall thickness was not assessed. Right ventricular systolic function is normal. Left Atrium: Left atrial size was normal in size. Right Atrium: Right atrial size was normal in size. Pericardium: There is no evidence of pericardial effusion. Mitral Valve: There is mild thickening of the mitral valve leaflet(s). Mild mitral annular calcification. Trivial mitral valve regurgitation. Tricuspid Valve: The tricuspid valve is normal in structure. Tricuspid valve regurgitation is mild. Aortic Valve: The aortic valve is tricuspid. Aortic valve regurgitation is not visualized. Pulmonic Valve: The pulmonic valve was not well visualized. Pulmonic valve regurgitation is trivial. Aorta: The aortic root is normal in size and structure. IAS/Shunts: No atrial level shunt detected by color flow Doppler. Agitated saline contrast was given intravenously to evaluate for intracardiac shunting.  LEFT VENTRICLE PLAX 2D LVIDd:         4.10 cm LVIDs:         3.60 cm LV PW:         1.30 cm LV IVS:        1.20 cm LVOT diam:     2.00 cm LV SV:         33 LV SV Index:   20 LVOT Area:     3.14 cm  LV Volumes (MOD) LV vol d, MOD A2C: 100.0 ml LV vol d, MOD A4C: 108.0 ml LV vol s, MOD A2C: 59.8 ml LV vol s, MOD A4C: 80.0 ml LV SV MOD A2C:     40.2 ml LV SV MOD A4C:     108.0 ml LV SV MOD BP:      32.1 ml RIGHT VENTRICLE RV S prime:     8.59 cm/s LEFT ATRIUM           Index        RIGHT ATRIUM          Index LA diam:      2.40 cm 1.41 cm/m   RA Area:     9.28 cm LA Vol (A2C): 46.8 ml 27.57 ml/m  RA Volume:   17.30 ml 10.19 ml/m LA Vol (A4C): 28.3 ml 16.67 ml/m  AORTIC VALVE LVOT Vmax:  66.20 cm/s LVOT Vmean:  47.200 cm/s LVOT VTI:    0.106 m  AORTA Ao Root diam: 3.10 cm  SHUNTS Systemic VTI:  0.11 m Systemic Diam: 2.00 cm Vina Gull MD Electronically signed by Vina Gull MD Signature  Date/Time: 08/23/2024/4:01:12 PM    Final    VAS US  CAROTID (at [facility] and [facility] only) Result Date: 08/23/2024 Carotid Arterial Duplex Study Patient Name:  CHIEKO NEISES  Date of Exam:   08/23/2024 Medical Rec #: [id number]      Accession #:    [id number] Date of Birth: 06/23/1956       Patient Gender: F Patient Age:   20 years Exam Location:  [facility] Procedure:      VAS US  CAROTID Referring Phys: DRUE POTTER --------------------------------------------------------------------------------  Indications:       TIA and dizziness with multiple falls. Risk Factors:      Hyperlipidemia, Diabetes, coronary artery disease. Other Factors:     Seizures,sleep apnea and obesity. Comparison Study:  No priors. Performing Technologist: Ricka Sturdivant-Jones RDMS, RVT  Examination Guidelines: A complete evaluation includes B-mode imaging, spectral Doppler, color Doppler, and power Doppler as needed of all accessible portions of each vessel. Bilateral testing is considered an integral part of a complete examination. Limited examinations for reoccurring indications may be performed as noted.  Right Carotid Findings: +----------+--------+--------+--------+------------------+------------------+           PSV cm/sEDV cm/sStenosisPlaque DescriptionComments           +----------+--------+--------+--------+------------------+------------------+ CCA Prox  56      21                                                   +----------+--------+--------+--------+------------------+------------------+ CCA Distal60      18                                                   +----------+--------+--------+--------+------------------+------------------+ ICA Prox  68      20      1-39%                     intimal thickening +----------+--------+--------+--------+------------------+------------------+ ICA Distal68      25                                                    +----------+--------+--------+--------+------------------+------------------+ ECA       112     16                                                   +----------+--------+--------+--------+------------------+------------------+ +----------+--------+-------+----------------+-------------------+           PSV cm/sEDV cmsDescribe        Arm Pressure (mmHG) +----------+--------+-------+----------------+-------------------+ [id number]             Multiphasic, WNL                    +----------+--------+-------+----------------+-------------------+ +---------+--------+--+--------+--+---------+  VertebralPSV cm/s40EDV cm/s15Antegrade +---------+--------+--+--------+--+---------+  Left Carotid Findings: +----------+--------+--------+--------+------------------+------------------+           PSV cm/sEDV cm/sStenosisPlaque DescriptionComments           +----------+--------+--------+--------+------------------+------------------+ CCA Prox  63      19              calcific                             +----------+--------+--------+--------+------------------+------------------+ CCA Distal79      27                                                   +----------+--------+--------+--------+------------------+------------------+ ICA Prox  61      26      1-39%                     intimal thickening +----------+--------+--------+--------+------------------+------------------+ ICA Distal55      26                                                   +----------+--------+--------+--------+------------------+------------------+ ECA       59      18                                                   +----------+--------+--------+--------+------------------+------------------+ +----------+--------+--------+----------------+-------------------+           PSV cm/sEDV cm/sDescribe        Arm Pressure (mmHG) +----------+--------+--------+----------------+-------------------+  [id number]              Multiphasic, WNL                    +----------+--------+--------+----------------+-------------------+ +---------+--------+--+--------+--+---------+ VertebralPSV cm/s42EDV cm/s19Antegrade +---------+--------+--+--------+--+---------+   Summary: Right Carotid: Velocities in the right ICA are consistent with a 1-39% stenosis. Left Carotid: Velocities in the left ICA are consistent with a 1-39% stenosis. Vertebrals:  Bilateral vertebral arteries demonstrate antegrade flow. Subclavians: Normal flow hemodynamics were seen in bilateral subclavian              arteries. *See table(s) above for measurements and observations.  Electronically signed by Debby Robertson on 08/23/2024 at 12:44:16 PM.    Final    MR ANGIO HEAD WO CONTRAST Result Date: 08/23/2024 EXAM: MR Angiography Head without intravenous contrast. 08/22/2024 11:56:00 PM TECHNIQUE: Magnetic resonance angiography images of the head without intravenous contrast. Multiplanar 2D and 3D reformatted images are provided for review. COMPARISON: None provided. CLINICAL HISTORY: Neuro deficit, acute, stroke suspected Neuro deficit, acute, stroke suspected FINDINGS: ANTERIOR CIRCULATION: No significant stenosis of the internal carotid arteries. No significant stenosis of the anterior cerebral arteries. No significant stenosis of the middle cerebral arteries. No aneurysm. POSTERIOR CIRCULATION: No significant stenosis of the posterior cerebral arteries. Moderate bilateral proximal P2 PCA stenosis. No significant stenosis of the basilar artery. No significant stenosis of the vertebral arteries. No aneurysm. IMPRESSION: 1. No large vessel occlusion. 2. Moderate bilateral proximal P2 PCA stenosis. Electronically signed by: Gilmore Molt MD 08/23/2024 12:07 AM EST RP  Workstation: [id number]   MR BRAIN WO CONTRAST Result Date: 08/22/2024 EXAM: MRI BRAIN WITHOUT CONTRAST 08/22/2024 06:00:03 PM TECHNIQUE: Multiplanar multisequence  MRI of the head/brain was performed without the administration of intravenous contrast. COMPARISON: MRI head 05/03/18 CLINICAL HISTORY: Neuro deficit, acute, stroke suspected; dizziness FINDINGS: BRAIN AND VENTRICLES: Small acute infarct in the left subinsular white mater. Small acute infarct in the right corona radiata. Small acute infarct in the right parietal lobe. No intracranial hemorrhage. No midline shift. No hydrocephalus. Interval increase in size of a now 2.2 x 1.5 cm extraaxial dural based mass along the left frontal convexity (series 5, image 23) that is compatible with a mengioma. Minimal mass effect. No brain edema. Normal flow voids. ORBITS: No acute abnormality. SINUSES AND MASTOIDS: Opacified right maxillary sinus. Trace mastoid effusions. BONES AND SOFT TISSUES: Normal marrow signal. IMPRESSION: 1. Small acute infarcts in the left subinsular white mater, right corona radiata, and right parietal lobe. 2. Since 2019, increase in size of a now 2.2 cm meningioma. Minimal mass effect. No brain edema. 3. Opacified right maxillary sinus. Electronically signed by: Gilmore Molt MD 08/22/2024 06:54 PM EST RP Workstation: [id number]    Scheduled Meds:  aspirin   81 mg Oral Daily   [START ON 08/25/2024] atorvastatin   80 mg Oral Daily   carvedilol   6.25 mg Oral BID WC   clopidogrel  75 mg Oral Daily   Dextromethorphan-buPROPion ER  1 tablet Oral q morning   enoxaparin  (LOVENOX ) injection  30 mg Subcutaneous Daily   insulin  aspart  0-5 Units Subcutaneous QHS   insulin  aspart  0-9 Units Subcutaneous TID WC   sacubitril-valsartan  1 tablet Oral BID   Continuous Infusions:     LOS: 1 day    Time spent: 35 mins    Darcel Dawley, MD [facility]   If 7PM-7AM, please contact night-coverage

## 2024-08-25 DIAGNOSIS — I639 Cerebral infarction, unspecified: Secondary | ICD-10-CM | POA: Diagnosis not present

## 2024-08-25 DIAGNOSIS — E1151 Type 2 diabetes mellitus with diabetic peripheral angiopathy without gangrene: Secondary | ICD-10-CM | POA: Diagnosis not present

## 2024-08-25 DIAGNOSIS — R29703 NIHSS score 3: Secondary | ICD-10-CM | POA: Diagnosis not present

## 2024-08-25 DIAGNOSIS — I6381 Other cerebral infarction due to occlusion or stenosis of small artery: Secondary | ICD-10-CM | POA: Diagnosis not present

## 2024-08-25 DIAGNOSIS — I110 Hypertensive heart disease with heart failure: Secondary | ICD-10-CM | POA: Diagnosis not present

## 2024-08-25 DIAGNOSIS — I420 Dilated cardiomyopathy: Secondary | ICD-10-CM | POA: Diagnosis not present

## 2024-08-25 LAB — GLUCOSE, CAPILLARY
Glucose-Capillary: 241 mg/dL — ABNORMAL HIGH (ref 70–99)
Glucose-Capillary: 245 mg/dL — ABNORMAL HIGH (ref 70–99)
Glucose-Capillary: 276 mg/dL — ABNORMAL HIGH (ref 70–99)
Glucose-Capillary: 266 mg/dL — ABNORMAL HIGH (ref 70–99)
Glucose-Capillary: 289 mg/dL — ABNORMAL HIGH (ref 70–99)
Glucose-Capillary: 268 mg/dL — ABNORMAL HIGH (ref 70–99)

## 2024-08-25 LAB — BASIC METABOLIC PANEL WITH GFR
Anion gap: 12 (ref 5–15)
BUN: 26 mg/dL — ABNORMAL HIGH (ref 8–23)
CO2: 25 mmol/L (ref 22–32)
Calcium: 8.9 mg/dL (ref 8.9–10.3)
Chloride: 99 mmol/L (ref 98–111)
Creatinine, Ser: 1.79 mg/dL — ABNORMAL HIGH (ref 0.44–1.00)
GFR, Estimated: 30 mL/min — ABNORMAL LOW (ref >60)
Glucose, Bld: 205 mg/dL — ABNORMAL HIGH (ref 70–99)
Potassium: 3.7 mmol/L (ref 3.5–5.1)
Sodium: 136 mmol/L (ref 135–145)

## 2024-08-25 MED ORDER — INSULIN GLARGINE-YFGN 100 UNIT/ML ~~LOC~~ SOLN
12.0000 [IU] | SUBCUTANEOUS | Status: DC
  Administered 2024-08-25 – 2024-08-26 (×2): 12 [IU] via SUBCUTANEOUS
  Filled 2024-08-25 (×2): qty 0.12

## 2024-08-25 NOTE — Inpatient Diabetes Management (Signed)
 Inpatient Diabetes Program Recommendations  AACE/ADA: New Consensus Statement on Inpatient Glycemic Control (2015)  Target Ranges:  Prepandial:   less than 140 mg/dL      Peak postprandial:   less than 180 mg/dL (1-2 hours)      Critically ill patients:  140 - 180 mg/dL   Lab Results  Component Value Date   GLUCAP 226 (H) 08/24/2024   HGBA1C 11.1 (H) 08/22/2024    Review of Glycemic Control  Diabetes history: DM2 Outpatient Diabetes medications: Novolin 70/30 12 units BID Current orders for Inpatient glycemic control: Novolog  0-9 TID with meals and 0-5 HS  HgbA1C - 11.1% Unable to speak with pt today regarding her diabetes control and HgbA1C.  Inpatient Diabetes Program Recommendations:    Consider adding Semglee  12 units daily  Add Novolog  3 units TID with meals if eating > 50%  See pt when appropriate to discuss diabetes control and HgbA1C of 11.1%.  Will need f/u for diabetes management after discharge with PCP.  Continue to follow.  Thank you. Shona Brandy, RD, LDN, CDCES Inpatient Diabetes Coordinator [phone]

## 2024-08-25 NOTE — Progress Notes (Addendum)
 PROGRESS NOTE    Sally Hendrix  FMW:[id number] DOB: 1955/12/24 DOA: 08/22/2024 PCP: Yolande Toribio MATSU, MD   Brief Narrative:  This 68 yrs old female with PMH significant for depression, CHF, hypertension, coronary artery disease, obesity, type 2 diabetes, seizures, sleep apnea brought into the ED for dizziness with multiple falls at home.  Yesterday dizziness became more pronounced with associated nausea and vomiting and fall at home necessitating ED visit.  MRI shows small acute infarct in the left subinsular white matter, right corona radiator and right parietal lobe.  2.2 cm meningioma increased in size from 2019.  Patient was admitted for further workup.  Neurology is consulted.  Assessment & Plan:   Principal Problem:   Acute ischemic stroke (HCC)  Acute ischemic stroke : Acute vertigo likely peripheral: MRI of the brain showing small acute infarct in the left subinsular white matter, right corona radiata and the right parietal lobe. According to neurologist this vertigo is likely peripheral and stroke is incidental finding. Neurologist recommended stroke workup. MRA : No LVO, Moderate bilateral proximal P2 PCA stenosis.  Carotid Duplex  : Mild stenosis on both sided 1-39% Echo showed LVEF 35-40%, Decreased LV function. LDL 215, Hemoglobin A1c 11.1 Allow Permissive hypertension for first 24 hours. Patient completed swallow evaluation at bedside. Continue aspirin  and Plavix for 21 days followed by aspirin  monotherapy. Continue Lipitor  80 mg daily Neurology signed off with outpatient follow-up.  Acute kidney injury: Mild hyponatremia likely secondary to dehydration. Patient presented with creatinine 1.78,  last known creatinine was 1.4 Continue IV hydration.  Monitor serum creatinine.   Meningioma: This has been chronic since 2019 however size now increased to 2.2 cm with minimal mass effect but no surrounding brain edema. Patient will need nonemergent neurosurgical  follow-up.   Chronic combined systolic and diastolic congestive heart failure: Echocardiogram shows LVEF 35 to 40%.  Left ventricular dysfunction Daily weight , Monitor input and output. Cardiology is consulted.  Patient started on Entresto and carvedilol  yesterday. Spironolactone  will be avoided in the setting of renal insufficiency. Once blood pressure controlled , would plan to repeat echo in 3 months later. She may need ischemic evaluation if LV function remains decreased at 3 months echo.   Essential hypertension: Coronary artery disease Antihypertensive are on hold due to permissive hypertension. Blood pressure medications resumed yesterday. Continue aspirin  and statin.   Insulin -dependent diabetes mellitus: Continue insulin  therapy. Monitor glucose level closely. Diabetic coordinator consulted.   Chronic seizures: Not on any home medication.  Hyperlipidemia: LDL above 200. Continue Lipitor  80 mg daily. Goal LDL below 55.  Vasovagal syncope: Patient had an episode of decreased mentation after using the bathroom for approximately 2 minutes. She denies any chest pain, no arrhythmia on telemetry.  Vitals normal.    DVT prophylaxis: SCDs Code Status: Full code Family Communication: Daughter at bed side. Disposition Plan:    Status is: Inpatient Remains inpatient appropriate because: CVA workup.  Patient had syncope while bowel movement  in bathroom.   Anticipated discharge home tomorrow.  Consultants:  Neurology Cardiology  Procedures:MRI Brain  Antimicrobials:  Anti-infectives (From admission, onward)    None       Subjective: Patient was seen and examined at bedside.  Overnight events noted. Patient had an episode of syncope while having bowel movement. Patient reports feeling weak in both lower extremities,  denying any other concerns.  Objective: Vitals:   08/25/24 0354 08/25/24 0740 08/25/24 1004 08/25/24 1113  BP: (!) 151/110 (!) 184/96 (!)  179/88 ROLLEN)  147/83  Pulse: 84 84 80 79  Resp: 15 18 17 18   Temp: 97.8 F (36.6 C) 97.9 F (36.6 C) 97.8 F (36.6 C) 98.1 F (36.7 C)  TempSrc: Oral Oral Axillary Oral  SpO2: 100% 94% 98% 95%  Weight:      Height:       No intake or output data in the 24 hours ending 08/25/24 1151  Filed Weights   08/23/24 1451  Weight: 66 kg    Examination:  General exam: Appears calm and comfortable, not in any acute distress. Respiratory system: CTA Bilaterally. Respiratory effort normal.  RR 15 Cardiovascular system: S1 & S2 heard, RRR. No JVD, murmurs, rubs, gallops or clicks.  Gastrointestinal system: Abdomen is non distended, soft and non tender.  Normal bowel sounds heard. Central nervous system: Alert and oriented x 3. No focal neurological deficits. Extremities: No edema, no cyanosis, no clubbing. Skin: No rashes, lesions or ulcers Psychiatry: Judgement and insight appear normal. Mood & affect appropriate.     Data Reviewed: I have personally reviewed following labs and imaging studies  CBC: Recent Labs  Lab 08/22/24 1119 08/23/24 0516 08/24/24 0426  WBC 11.1* 11.7* 8.9  NEUTROABS 7.4  --   --   HGB 13.5 13.4 12.7  HCT 38.6 39.4 38.1  MCV 83.5 85.5 85.0  PLT 279 271 263   Basic Metabolic Panel: Recent Labs  Lab 08/22/24 1119 08/23/24 0516 08/24/24 0426 08/25/24 0354  NA 133* 134* 139 136  K 3.9 3.6 3.7 3.7  CL 96* 99 103 99  CO2 25 23 22 25   GLUCOSE 274* 190* 284* 205*  BUN 29* 25* 28* 26*  CREATININE 1.78* 1.62* 1.63* 1.79*  CALCIUM  10.1 8.7* 8.7* 8.9  MG  --   --  1.9  --   PHOS  --   --  4.6  --    GFR: Estimated Creatinine Clearance: 27.1 mL/min (A) (by C-G formula based on SCr of 1.79 mg/dL (H)). Liver Function Tests: Recent Labs  Lab 08/22/24 1119  AST 15  ALT 10  ALKPHOS 130*  BILITOT 0.4  PROT 7.0  ALBUMIN 3.8   No results for input(s): LIPASE, AMYLASE in the last 168 hours. No results for input(s): AMMONIA in the last 168  hours. Coagulation Profile: No results for input(s): INR, PROTIME in the last 168 hours. Cardiac Enzymes: No results for input(s): CKTOTAL, CKMB, CKMBINDEX, TROPONINI in the last 168 hours. BNP (last 3 results) No results for input(s): PROBNP in the last 8760 hours. HbA1C: Recent Labs    08/22/24 2129  HGBA1C 11.1*   CBG: Recent Labs  Lab 08/24/24 2119 08/25/24 0610 08/25/24 0739 08/25/24 1002 08/25/24 1147  GLUCAP 226* 241* 245* 276* 266*   Lipid Profile: Recent Labs    08/22/24 2129  CHOL 322*  HDL 37*  LDLCALC 215*  TRIG 349*  CHOLHDL 8.7   Thyroid  Function Tests: No results for input(s): TSH, T4TOTAL, FREET4, T3FREE, THYROIDAB in the last 72 hours. Anemia Panel: No results for input(s): VITAMINB12, FOLATE, FERRITIN, TIBC, IRON, RETICCTPCT in the last 72 hours. Sepsis Labs: No results for input(s): PROCALCITON, LATICACIDVEN in the last 168 hours.  No results found for this or any previous visit (from the past 240 hours).   Radiology Studies: ECHOCARDIOGRAM COMPLETE Result Date: 08/23/2024    ECHOCARDIOGRAM REPORT   Patient Name:   Sally Hendrix Date of Exam: 08/23/2024 Medical Rec #:  [id number]     Height:  62.5 in Accession #:    [id number]    Weight:       149.1 lb Date of Birth:  1956-03-16      BSA:          1.698 m Patient Age:    68 years      BP:           167/100 mmHg Patient Gender: F             HR:           89 bpm. Exam Location:  Inpatient Procedure: 2D Echo and Saline Contrast Bubble Study (Both Spectral and Color            Flow Doppler were utilized during procedure). Indications:    stroke  History:        Patient has no prior history of Echocardiogram examinations.                 CHF, Previous Myocardial Infarction, Stroke,                 Signs/Symptoms:Chest Pain; Risk Factors:Diabetes and                 Hypertension.  Sonographer:    Melissa Morford RDCS (AE, PE) Referring Phys: [phone] PRINCE T DJAN  IMPRESSIONS  1. Left ventricular ejection fraction, by estimation, is 30 to 35%. The left ventricle has moderately decreased function. The left ventricle demonstrates global hypokinesis. There is mild left ventricular hypertrophy. Left ventricular diastolic parameters are indeterminate.  2. Right ventricular systolic function is normal. The right ventricular size is normal.  3. Trivial mitral valve regurgitation.  4. The aortic valve is tricuspid. Aortic valve regurgitation is not visualized. FINDINGS  Left Ventricle: Left ventricular ejection fraction, by estimation, is 30 to 35%. The left ventricle has moderately decreased function. The left ventricle demonstrates global hypokinesis. The left ventricular internal cavity size was normal in size. There is mild left ventricular hypertrophy. Left ventricular diastolic parameters are indeterminate. Right Ventricle: The right ventricular size is normal. Right vetricular wall thickness was not assessed. Right ventricular systolic function is normal. Left Atrium: Left atrial size was normal in size. Right Atrium: Right atrial size was normal in size. Pericardium: There is no evidence of pericardial effusion. Mitral Valve: There is mild thickening of the mitral valve leaflet(s). Mild mitral annular calcification. Trivial mitral valve regurgitation. Tricuspid Valve: The tricuspid valve is normal in structure. Tricuspid valve regurgitation is mild. Aortic Valve: The aortic valve is tricuspid. Aortic valve regurgitation is not visualized. Pulmonic Valve: The pulmonic valve was not well visualized. Pulmonic valve regurgitation is trivial. Aorta: The aortic root is normal in size and structure. IAS/Shunts: No atrial level shunt detected by color flow Doppler. Agitated saline contrast was given intravenously to evaluate for intracardiac shunting.  LEFT VENTRICLE PLAX 2D LVIDd:         4.10 cm LVIDs:         3.60 cm LV PW:         1.30 cm LV IVS:        1.20 cm LVOT diam:      2.00 cm LV SV:         33 LV SV Index:   20 LVOT Area:     3.14 cm  LV Volumes (MOD) LV vol d, MOD A2C: 100.0 ml LV vol d, MOD A4C: 108.0 ml LV vol s, MOD A2C: 59.8 ml LV vol s, MOD A4C: 80.0 ml  LV SV MOD A2C:     40.2 ml LV SV MOD A4C:     108.0 ml LV SV MOD BP:      32.1 ml RIGHT VENTRICLE RV S prime:     8.59 cm/s LEFT ATRIUM           Index        RIGHT ATRIUM          Index LA diam:      2.40 cm 1.41 cm/m   RA Area:     9.28 cm LA Vol (A2C): 46.8 ml 27.57 ml/m  RA Volume:   17.30 ml 10.19 ml/m LA Vol (A4C): 28.3 ml 16.67 ml/m  AORTIC VALVE LVOT Vmax:   66.20 cm/s LVOT Vmean:  47.200 cm/s LVOT VTI:    0.106 m  AORTA Ao Root diam: 3.10 cm  SHUNTS Systemic VTI:  0.11 m Systemic Diam: 2.00 cm Vina Gull MD Electronically signed by Vina Gull MD Signature Date/Time: 08/23/2024/4:01:12 PM    Final     Scheduled Meds:  aspirin   81 mg Oral Daily   atorvastatin   80 mg Oral Daily   carvedilol   6.25 mg Oral BID WC   clopidogrel  75 mg Oral Daily   Dextromethorphan-buPROPion ER  1 tablet Oral q morning   enoxaparin  (LOVENOX ) injection  30 mg Subcutaneous Daily   insulin  aspart  0-5 Units Subcutaneous QHS   insulin  aspart  0-9 Units Subcutaneous TID WC   insulin  glargine-yfgn  12 Units Subcutaneous Q24H   sacubitril-valsartan  1 tablet Oral BID   Continuous Infusions:     LOS: 2 days    Time spent: 50 mins    Darcel Dawley, MD [facility]   If 7PM-7AM, please contact night-coverage

## 2024-08-25 NOTE — Inpatient Diabetes Management (Signed)
 Inpatient Diabetes Program Recommendations  AACE/ADA: New Consensus Statement on Inpatient Glycemic Control (2015)  Target Ranges:  Prepandial:   less than 140 mg/dL      Peak postprandial:   less than 180 mg/dL (1-2 hours)      Critically ill patients:  140 - 180 mg/dL   Lab Results  Component Value Date   GLUCAP 266 (H) 08/25/2024   HGBA1C 11.1 (H) 08/22/2024    Review of Glycemic Control  Latest Reference Range & Units 08/25/24 06:10 08/25/24 07:39 08/25/24 10:02 08/25/24 11:47  Glucose-Capillary 70 - 99 mg/dL 758 (H) 754 (H) 723 (H) 266 (H)  HgbA1C - 11.1% Diabetes history: DM2 Outpatient Diabetes medications: Novolin 70/30 12 units BID Current orders for Inpatient glycemic control:  Novolog  0-9 TID with meals and 0-5 HS Semglee  12 units daily (started today)  Inpatient Diabetes Program Recommendations:    May consider adding Novolog  3 units tid with meals (hold if patient eats less than 50% or NPO.   Spoke to patient regarding DM management.  She states that blood sugars are usually between 150-300 mg/dL.  She does wear a sensor for monitoring.  She see's Dr. Yolande for her DM but states she has not seen him for 1-2 years.   Encouraged close follow-up with MD and reminded patient that goal for A1C is 7% which is an average CBG of 150 mg/dL.  Discussed the profile of 70/30 and that she is not on this insulin  in the hospital (must be taken with meals).  It does appear that she needs adjustment of her home DM medications. Patient verbalized understanding.   Thanks,  Randall Bullocks, RN, BC-ADM Inpatient Diabetes Coordinator Pager [phone]  (8a-5p)

## 2024-08-25 NOTE — Evaluation (Signed)
 Speech Language Pathology Evaluation Patient Details Name: Sally Hendrix MRN: [id number] DOB: 05-01-1956 Today's Date: 08/25/2024 Time: 8545-8481 SLP Time Calculation (min) (ACUTE ONLY): 24 min  Problem List:  Patient Active Problem List   Diagnosis Date Noted   Acute ischemic stroke (HCC) 08/22/2024   Endophthalmitis, acute, left 08/23/2018    Class: Present on Admission   Sleep apnea, obstructive 08/23/2018   Seizures (HCC) 05/04/2018   Seizure (HCC) 05/03/2018   Hypomagnesemia 05/03/2018   Hyponatremia 05/03/2018   Non-ST elevation (NSTEMI) myocardial infarction [facility])    Chest pain 12/09/2016   Uncontrolled type 2 diabetes mellitus with hyperglycemia (HCC) 12/09/2016   Hypertensive urgency 12/09/2016   Chronic systolic heart failure (HCC) 03/31/2011   Essential hypertension 03/31/2011   DM2 (diabetes mellitus, type 2) (HCC) 03/31/2011   Past Medical History:  Past Medical History:  Diagnosis Date   Cervical cancer (HCC) 1980s   CHF (congestive heart failure) (HCC)    Depression    all my life (05/03/2018)   Glaucoma    History of kidney stones    Hypertension    Myocardial infarction (HCC) 12/10/2016   Nonischemic cardiomyopathy (HCC)    Obesity    Seizure (HCC) 05/02/2018 X2-3   Sleep apnea    not dx'd (05/03/2018)   Type II diabetes mellitus (HCC)    Past Surgical History:  Past Surgical History:  Procedure Laterality Date   CARDIAC CATHETERIZATION     CATARACT EXTRACTION Bilateral    CERVIX LESION DESTRUCTION  1980s   for cancer   CORONARY STENT INTERVENTION N/A 12/10/2016   Procedure: Coronary Stent Intervention;  Surgeon: Deatrice DELENA Cage, MD;  Location: [facility] INVASIVE CV LAB;  Service: Cardiovascular;  Laterality: N/A;   LEFT HEART CATH AND CORONARY ANGIOGRAPHY N/A 12/10/2016   Procedure: Left Heart Cath and Coronary Angiography;  Surgeon: Deatrice DELENA Cage, MD;  Location: [facility] INVASIVE CV LAB;  Service: Cardiovascular;  Laterality: N/A;   PARS PLANA  VITRECTOMY Left 08/23/2018   Procedure: PARS PLANA VITRECTOMY 25 GAUGE FOR ENDOPHTHALMITIS;  Surgeon: Elner Arley DELENA, MD;  Location: [facility] OR;  Service: Ophthalmology;  Laterality: Left;   PARS PLANA VITRECTOMY Left 08/26/2018   Procedure: PARS PLANA VITRECTOMY 25 GAUGE FOR ENDOPHTHALMITIS, diagnostic vitreous tap;  Surgeon: Elner Arley DELENA, MD;  Location: [facility] OR;  Service: Ophthalmology;  Laterality: Left;   HPI:  Sally Hendrix is a 68 y.o. female presented to the ER with dizziness and multiple falls at home. Found to have small acute infarcts in the left subinsular white mater, right corona radiata, and right parietal lobe.As well as increase in size of a now 2.2 cm meningioma. PHMx:depression, CHF, hypertension, coronary artery disease, obesity, type 2 diabetes, seizures, sleep apnea. Family reports cognitive decline for approximately three months PTA.   Assessment / Plan / Recommendation Clinical Impression  Pt presents with cognitive changes that family noticed to be worsening about 3 months PTA. They do notice some exacerbation this admission but it sounds like the more significant decline had already been going on for several months. Family is already stepping in to assist at home. Would continue family support as well as OP SLP upon discharge.   Pt scored 17/30 on the SLUMS (27 and above considered to be WNL). She had a lot of difficulty throughout testing with sustained attention, impacting her comprehension of instructions or her recall of information. She often asked for repetitions or looked to her family for assistance, even in answering questions about her PLOF or  home set up. Family describes noticing errors in things like bill paying - either forgetting to pay a bill, or paying someone else's bill. They have started to help with finances and medication management because of the errors that she was making.     SLP Assessment  SLP Recommendation/Assessment: All further Speech Language  Pathology needs can be addressed in the next venue of care SLP Visit Diagnosis: Cognitive communication deficit (R41.841)     Assistance Recommended at Discharge  Frequent or constant Supervision/Assistance  Functional Status Assessment Patient has had a recent decline in their functional status and demonstrates the ability to make significant improvements in function in a reasonable and predictable amount of time.  Frequency and Duration           SLP Evaluation Cognition  Overall Cognitive Status: Impaired/Different from baseline Arousal/Alertness: Awake/alert Orientation Level: Oriented X4 Attention: Sustained Sustained Attention: Impaired Sustained Attention Impairment: Verbal basic;Verbal complex Memory: Impaired Memory Impairment: Decreased recall of new information;Retrieval deficit Awareness: Impaired Awareness Impairment: Intellectual impairment Problem Solving: Impaired Problem Solving Impairment: Verbal complex Safety/Judgment: Impaired       Comprehension  Auditory Comprehension Overall Auditory Comprehension: Impaired Commands: Impaired One Step Basic Commands: 75-100% accurate Conversation: Simple Interfering Components: Attention    Expression Expression Primary Mode of Expression: Verbal Verbal Expression Overall Verbal Expression: Appears within functional limits for tasks assessed   Oral / Motor  Motor Speech Overall Motor Speech: Appears within functional limits for tasks assessed            Leita SAILOR., M.A. CCC-SLP Acute Rehabilitation Services Office: [phone]  Secure chat preferred  08/25/2024, 3:36 PM

## 2024-08-25 NOTE — Plan of Care (Signed)

## 2024-08-25 NOTE — Progress Notes (Signed)
 STROKE TEAM PROGRESS NOTE    INTERIM HISTORY/SUBJECTIVE Sally Hendrix is a 68 y.o. with hx of uncontrolled T2DM on insulin  therapy, left eye blindness, HTN, CAD s/p DES, CHF, depression, seizure presents with dizziness and falls at home. LKW was unclear on admission.   States feeling well today. Did have brief episode after urinating and getting up from toilet where she became pale and at one point unresponsive. Assessed by rapid response and primary team. Patient states she is now at baseline and no acute symptoms.   OBJECTIVE  CBC    Component Value Date/Time   WBC 8.9 08/24/2024 0426   RBC 4.48 08/24/2024 0426   HGB 12.7 08/24/2024 0426   HCT 38.1 08/24/2024 0426   PLT 263 08/24/2024 0426   MCV 85.0 08/24/2024 0426   MCH 28.3 08/24/2024 0426   MCHC 33.3 08/24/2024 0426   RDW 12.9 08/24/2024 0426   LYMPHSABS 2.8 08/22/2024 1119   MONOABS 0.5 08/22/2024 1119   EOSABS 0.2 08/22/2024 1119   BASOSABS 0.0 08/22/2024 1119    BMET    Component Value Date/Time   NA 136 08/25/2024 0354   K 3.7 08/25/2024 0354   CL 99 08/25/2024 0354   CO2 25 08/25/2024 0354   GLUCOSE 205 (H) 08/25/2024 0354   BUN 26 (H) 08/25/2024 0354   CREATININE 1.79 (H) 08/25/2024 0354   CALCIUM  8.9 08/25/2024 0354   GFRNONAA 30 (L) 08/25/2024 0354    IMAGING past 24 hours No results found.   Vitals:   08/25/24 0354 08/25/24 0740 08/25/24 1004 08/25/24 1113  BP: (!) 151/110 (!) 184/96 (!) 179/88 (!) 147/83  Pulse: 84 84 80 79  Resp: 15 18 17 18   Temp: 97.8 F (36.6 C) 97.9 F (36.6 C) 97.8 F (36.6 C) 98.1 F (36.7 C)  TempSrc: Oral Oral Oral Oral  SpO2: 100% 94% 98% 95%  Weight:      Height:         PHYSICAL EXAM General:  Alert, poor dentition noted, patient in no acute distress Psych:  Normal mood and affect  CV: Regular rate and rhythm on monitor Respiratory:  Regular, unlabored respirations on room air   NEURO:  Mental Status: AA&Ox3, patient is able to give clear and coherent  history Speech/Language: speech is without dysarthria or aphasia.   Cranial Nerves:  II: PERRL on right eye, reports hx of blindness of left eye and unreactive  III, IV, VI: EOMI. Eyelids elevate symmetrically. No nystagmus noted.  V: Sensation is intact to light touch and symmetrical to face.  VII: Face is symmetrical resting and smiling VIII: hearing intact to voice. IX, X: Palate elevates symmetrically. Phonation is normal.  KP:Dynloizm shrug 5/5. XII: tongue is midline without fasciculations. Motor: 5/5 strength to all muscle groups tested.  Sensation- Intact to light touch bilaterally. Extinction absent to light touch to DSS.   Coordination: FTN intact bilaterally  Gait- deferred  Most Recent NIH 3     ASSESSMENT/PLAN  Ms. Sally Hendrix is a 68 y.o. female with history of uncontrolled T2DM on insulin  therapy, left eye blindness, HTN, CAD s/p DES, CHF, depression, seizure admitted for multiple cortical infarcts.  NIH on Admission 0  Acute Ischemic Infarct:  L subinsular, right corona radiata and right parietal lobe infarcts   Etiology: concern if cryptogenic stroke vs small vessel disease w/ multiple uncontrolled risk factors CT head No acute abnormality. Chronic infarct in corpus callosum. Chronic L frontal meningioma now at 2.3 cm w/ minimal  mass effect and no edema. MRI  acute infarcts in L subinsular, R corona radiata and R parietal lobe, also noted 2.2 cm meningioma slightly increased MRA  No LVO w/ moderate b/l P2 PCA stenosis  Carotid Doppler  no significant stenosis  2D Echo EF 30-35% w/ global hypokinesis, no interatrial shunt detected - cardiology consulted/following Consulted EP for loop recorder, given reduced EF, will do 30 day event monitor rather than ILR LDL 215  HgbA1c 11.1 VTE prophylaxis: enoxaparin  (LOVENOX ) injection 30 mg Start: 08/24/24 1200 No antithrombotic prior to admission, now on aspirin  81 mg daily and clopidogrel  75 mg daily for 3 weeks and then  ASA alone. Therapy recommendations:  Outpatient PT Disposition:  pending, likely home, anticipate inpatient neuro to sign off w/ outpatient f/u in 8 weeks   Left frontal meningioma  Noted since 2019, size increased to 2.2 cm from 1.9 cm Minimal mass effect but no edema Recommend outpatient f/u with neurosurgery  Hx of peripheral vertigo Recommend vestibular PT for evaluation   Hypertension Home meds:  Coreg  37.5 mg BID Stable, allowed for permissive HTN but now can optimize BP control gradually  Started on GDMT for HFrEF, BP goal at home 130/80  Hyperlipidemia Home meds:  None LDL 215, goal < 70 Added atorvastatin  40 mg daily Discussed about Leqvio, patient completed form Continue statin at discharge  Diabetes type II Uncontrolled on insulin  therapy Home meds:  Novolin 70/30 (query adherence at home) HgbA1c 11.1, goal < 7.0 CBGs  SSI Recommend consult with diabetes coordinator to optimize insulin  regimen before discharge  Recommend close follow-up with PCP for better DM control   Dysphagia SLP consulted     Diet   DIET DYS 3 Room service appropriate? Yes; Fluid consistency: Thin  Advance diet as tolerated  Other Stroke Risk Factors Coronary artery disease: LHC 2018 s/p DES of prox LAD, not on ADA or statin therapy, taking BB Hx of CHF on home Lasix , last echo 2018 EF 50-55% but LHC 8018 noted 40%, repeat EF 30-35%, cardiology followng Obstructive sleep apnea  Other Active Problems AKI: deferred to primary team    Hospital day # 2  I have personally obtained history,examined this patient, reviewed notes, independently viewed imaging studies, participated in medical decision making and plan of care.ROS completed by me personally and pertinent positives fully documented  I have made any additions or clarifications directly to the above note. Agree with note above.  Recommend aspirin  and Plavix  for 3 weeks and then aspirin  alone.  30-day heart monitor at discharge for  paroxysmal A-fib.  Stroke team will sign off.  Call for questions.  Eather Popp, MD Medical Director [facility] Pager: [phone] 08/25/2024 2:58 PM   To contact Stroke Continuity provider, please refer to [email or web address]. After hours, contact General Neurology

## 2024-08-25 NOTE — Progress Notes (Signed)
 RN called by the front desk to the room around 1000.  patient was standing up with the help of the son after using the bathroom.   looking pale,  unresponsive, cold and clammy.  Radial pulse feeble.  Rapid response called and Dr Leotis notified.  Patient helped back in back via a the rolling recliner.  Patient started responding to voice, cbg 276,  vital sign taken. Temp 97.8,Pulse 80,  bp179/88,  resp 17 and O2 sat 98% on room air.  NIH 3, This episode lasted approximately 5 min,  paient returned to her baseline,  has no recollection of the episode.  Repeat bedside swallow eval.  patient has no dysphagia.  Will continue to monitor patient closely.

## 2024-08-25 NOTE — Progress Notes (Signed)
 Rounding Note   Patient Name: Sally Hendrix Date of Encounter: 08/25/2024  [facility] Cardiologist: Redell Shallow, MD   Subjective Patient had an episode of decreased mentation after using the bathroom today for approximately 2 minutes.  No associated chest pain, arrhythmias on telemetry and vitals apparently normal.  She denies chest pain or dyspnea at present.  Scheduled Meds:  aspirin   81 mg Oral Daily   atorvastatin   80 mg Oral Daily   carvedilol   6.25 mg Oral BID WC   clopidogrel  75 mg Oral Daily   Dextromethorphan-buPROPion ER  1 tablet Oral q morning   enoxaparin  (LOVENOX ) injection  30 mg Subcutaneous Daily   insulin  aspart  0-5 Units Subcutaneous QHS   insulin  aspart  0-9 Units Subcutaneous TID WC   insulin  glargine-yfgn  12 Units Subcutaneous Q24H   sacubitril-valsartan  1 tablet Oral BID   Continuous Infusions:  PRN Meds: acetaminophen  **OR** acetaminophen  (TYLENOL ) oral liquid 160 mg/5 mL **OR** acetaminophen , hydrALAZINE , ondansetron  (ZOFRAN ) IV, senna-docusate   Vital Signs  Vitals:   08/24/24 2332 08/25/24 0354 08/25/24 0740 08/25/24 1004  BP: (!) 149/89 (!) 151/110 (!) 184/96 (!) 179/88  Pulse: 86 84 84 80  Resp: 15 15 18 17   Temp: 97.6 F (36.4 C) 97.8 F (36.6 C) 97.9 F (36.6 C) 97.8 F (36.6 C)  TempSrc: Oral Oral Oral Axillary  SpO2: 96% 100% 94% 98%  Weight:      Height:       No intake or output data in the 24 hours ending 08/25/24 1015    08/23/2024    2:51 PM 05/16/2021    1:33 PM 01/23/2020   11:17 AM  Last 3 Weights  Weight (lbs) 145 lb 8.1 oz 149 lb 2 oz 149 lb  Weight (kg) 66 kg 67.643 kg 67.586 kg      Telemetry Sinus- Personally Reviewed   Physical Exam  GEN: No acute distress.   Neck: No JVD Cardiac: RRR, no murmurs, rubs, or gallops.  Respiratory: Clear to auscultation bilaterally. GI: Soft, nontender, non-distended  MS: No edema Neuro:  Nonfocal  Psych: Normal affect   Labs  Chemistry Recent Labs   Lab 08/22/24 1119 08/23/24 0516 08/24/24 0426 08/25/24 0354  NA 133* 134* 139 136  K 3.9 3.6 3.7 3.7  CL 96* 99 103 99  CO2 25 23 22 25   GLUCOSE 274* 190* 284* 205*  BUN 29* 25* 28* 26*  CREATININE 1.78* 1.62* 1.63* 1.79*  CALCIUM  10.1 8.7* 8.7* 8.9  MG  --   --  1.9  --   PROT 7.0  --   --   --   ALBUMIN 3.8  --   --   --   AST 15  --   --   --   ALT 10  --   --   --   ALKPHOS 130*  --   --   --   BILITOT 0.4  --   --   --   GFRNONAA 30* 34* 34* 30*  ANIONGAP 12 12 14 12     Lipids  Recent Labs  Lab 08/22/24 2129  CHOL 322*  TRIG 349*  HDL 37*  LDLCALC 215*  CHOLHDL 8.7    Hematology Recent Labs  Lab 08/22/24 1119 08/23/24 0516 08/24/24 0426  WBC 11.1* 11.7* 8.9  RBC 4.62 4.61 4.48  HGB 13.5 13.4 12.7  HCT 38.6 39.4 38.1  MCV 83.5 85.5 85.0  MCH 29.2 29.1 28.3  MCHC  35.0 34.0 33.3  RDW 12.6 12.9 12.9  PLT 279 271 263    Radiology  ECHOCARDIOGRAM COMPLETE Result Date: 08/23/2024    ECHOCARDIOGRAM REPORT   Patient Name:   Sally Hendrix Date of Exam: 08/23/2024 Medical Rec #:  [id number]     Height:       62.5 in Accession #:    [id number]    Weight:       149.1 lb Date of Birth:  03-25-1956      BSA:          1.698 m Patient Age:    68 years      BP:           167/100 mmHg Patient Gender: F             HR:           89 bpm. Exam Location:  Inpatient Procedure: 2D Echo and Saline Contrast Bubble Study (Both Spectral and Color            Flow Doppler were utilized during procedure). Indications:    stroke  History:        Patient has no prior history of Echocardiogram examinations.                 CHF, Previous Myocardial Infarction, Stroke,                 Signs/Symptoms:Chest Pain; Risk Factors:Diabetes and                 Hypertension.  Sonographer:    Melissa Morford RDCS (AE, PE) Referring Phys: [phone] PRINCE T DJAN IMPRESSIONS  1. Left ventricular ejection fraction, by estimation, is 30 to 35%. The left ventricle has moderately decreased function. The left  ventricle demonstrates global hypokinesis. There is mild left ventricular hypertrophy. Left ventricular diastolic parameters are indeterminate.  2. Right ventricular systolic function is normal. The right ventricular size is normal.  3. Trivial mitral valve regurgitation.  4. The aortic valve is tricuspid. Aortic valve regurgitation is not visualized. FINDINGS  Left Ventricle: Left ventricular ejection fraction, by estimation, is 30 to 35%. The left ventricle has moderately decreased function. The left ventricle demonstrates global hypokinesis. The left ventricular internal cavity size was normal in size. There is mild left ventricular hypertrophy. Left ventricular diastolic parameters are indeterminate. Right Ventricle: The right ventricular size is normal. Right vetricular wall thickness was not assessed. Right ventricular systolic function is normal. Left Atrium: Left atrial size was normal in size. Right Atrium: Right atrial size was normal in size. Pericardium: There is no evidence of pericardial effusion. Mitral Valve: There is mild thickening of the mitral valve leaflet(s). Mild mitral annular calcification. Trivial mitral valve regurgitation. Tricuspid Valve: The tricuspid valve is normal in structure. Tricuspid valve regurgitation is mild. Aortic Valve: The aortic valve is tricuspid. Aortic valve regurgitation is not visualized. Pulmonic Valve: The pulmonic valve was not well visualized. Pulmonic valve regurgitation is trivial. Aorta: The aortic root is normal in size and structure. IAS/Shunts: No atrial level shunt detected by color flow Doppler. Agitated saline contrast was given intravenously to evaluate for intracardiac shunting.  LEFT VENTRICLE PLAX 2D LVIDd:         4.10 cm LVIDs:         3.60 cm LV PW:         1.30 cm LV IVS:        1.20 cm LVOT diam:     2.00 cm LV  SV:         33 LV SV Index:   20 LVOT Area:     3.14 cm  LV Volumes (MOD) LV vol d, MOD A2C: 100.0 ml LV vol d, MOD A4C: 108.0 ml LV  vol s, MOD A2C: 59.8 ml LV vol s, MOD A4C: 80.0 ml LV SV MOD A2C:     40.2 ml LV SV MOD A4C:     108.0 ml LV SV MOD BP:      32.1 ml RIGHT VENTRICLE RV S prime:     8.59 cm/s LEFT ATRIUM           Index        RIGHT ATRIUM          Index LA diam:      2.40 cm 1.41 cm/m   RA Area:     9.28 cm LA Vol (A2C): 46.8 ml 27.57 ml/m  RA Volume:   17.30 ml 10.19 ml/m LA Vol (A4C): 28.3 ml 16.67 ml/m  AORTIC VALVE LVOT Vmax:   66.20 cm/s LVOT Vmean:  47.200 cm/s LVOT VTI:    0.106 m  AORTA Ao Root diam: 3.10 cm  SHUNTS Systemic VTI:  0.11 m Systemic Diam: 2.00 cm Vina Gull MD Electronically signed by Vina Gull MD Signature Date/Time: 08/23/2024/4:01:12 PM    Final    VAS US  CAROTID (at [facility] and [facility] only) Result Date: 08/23/2024 Carotid Arterial Duplex Study Patient Name:  Sally Hendrix  Date of Exam:   08/23/2024 Medical Rec #: [id number]      Accession #:    [id number] Date of Birth: 10/11/1955       Patient Gender: F Patient Age:   25 years Exam Location:  [facility] Procedure:      VAS US  CAROTID Referring Phys: DRUE POTTER --------------------------------------------------------------------------------  Indications:       TIA and dizziness with multiple falls. Risk Factors:      Hyperlipidemia, Diabetes, coronary artery disease. Other Factors:     Seizures,sleep apnea and obesity. Comparison Study:  No priors. Performing Technologist: Ricka Sturdivant-Jones RDMS, RVT  Examination Guidelines: A complete evaluation includes B-mode imaging, spectral Doppler, color Doppler, and power Doppler as needed of all accessible portions of each vessel. Bilateral testing is considered an integral part of a complete examination. Limited examinations for reoccurring indications may be performed as noted.  Right Carotid Findings: +----------+--------+--------+--------+------------------+------------------+           PSV cm/sEDV cm/sStenosisPlaque DescriptionComments            +----------+--------+--------+--------+------------------+------------------+ CCA Prox  56      21                                                   +----------+--------+--------+--------+------------------+------------------+ CCA Distal60      18                                                   +----------+--------+--------+--------+------------------+------------------+ ICA Prox  68      20      1-39%                     intimal thickening +----------+--------+--------+--------+------------------+------------------+ ICA  Distal68      25                                                   +----------+--------+--------+--------+------------------+------------------+ ECA       112     16                                                   +----------+--------+--------+--------+------------------+------------------+ +----------+--------+-------+----------------+-------------------+           PSV cm/sEDV cmsDescribe        Arm Pressure (mmHG) +----------+--------+-------+----------------+-------------------+ [id number]             Multiphasic, WNL                    +----------+--------+-------+----------------+-------------------+ +---------+--------+--+--------+--+---------+ VertebralPSV cm/s40EDV cm/s15Antegrade +---------+--------+--+--------+--+---------+  Left Carotid Findings: +----------+--------+--------+--------+------------------+------------------+           PSV cm/sEDV cm/sStenosisPlaque DescriptionComments           +----------+--------+--------+--------+------------------+------------------+ CCA Prox  63      19              calcific                             +----------+--------+--------+--------+------------------+------------------+ CCA Distal79      27                                                   +----------+--------+--------+--------+------------------+------------------+ ICA Prox  61      26      1-39%                      intimal thickening +----------+--------+--------+--------+------------------+------------------+ ICA Distal55      26                                                   +----------+--------+--------+--------+------------------+------------------+ ECA       59      18                                                   +----------+--------+--------+--------+------------------+------------------+ +----------+--------+--------+----------------+-------------------+           PSV cm/sEDV cm/sDescribe        Arm Pressure (mmHG) +----------+--------+--------+----------------+-------------------+ Dlarojcpjw12              Multiphasic, WNL                    +----------+--------+--------+----------------+-------------------+ +---------+--------+--+--------+--+---------+ VertebralPSV cm/s42EDV cm/s19Antegrade +---------+--------+--+--------+--+---------+   Summary: Right Carotid: Velocities in the right ICA are consistent with a 1-39% stenosis. Left Carotid: Velocities in the left ICA are consistent with a 1-39% stenosis. Vertebrals:  Bilateral vertebral arteries demonstrate antegrade flow. Subclavians: Normal flow hemodynamics  were seen in bilateral subclavian              arteries. *See table(s) above for measurements and observations.  Electronically signed by Debby Robertson on 08/23/2024 at 12:44:16 PM.    Final     Patient Profile   68 year old female with past medical history of uncontrolled hypertension, diabetes mellitus, coronary artery disease, obstructive sleep apnea, seizures, reduced LV function improved admitted with CVA for evaluation of cardiomyopathy. Patient had PCI of the LAD in 2018. Echocardiogram at that time showed ejection fraction 50 to 55% and mild left atrial enlargement. She has been readmitted with CVA. MRA showed no large vessel occlusion with moderate bilateral proximal P2 PCA stenosis. MRI showed small acute infarcts in the left subinsular white  matter, right corona radiata and right parietal lobe. Carotid Dopplers show 1 to 39% bilateral stenosis. Echocardiogram showed ejection fraction 30 to 35%, mild left ventricular hypertrophy.   Assessment & Plan  1 cardiomyopathy-felt to be possibly hypertensive mediated.  She does have a history of coronary disease but has not had chest pain and no history of alcohol  abuse.  Plan is medical therapy.  Continue Entresto and carvedilol  which were initiated yesterday.  Will avoid spironolactone  in the setting of renal insufficiency.  Will increase medications later pending follow-up blood pressure readings.  Once blood pressure controlled would plan to repeat echocardiogram 3 months later.  If LV function remains decreased would need ischemia evaluation.     2 coronary artery disease-patient has a history of coronary disease but denies chest pain.  Continue aspirin  and statin.   3 severe hyperlipidemia-LDL greater than 200.  Continue Lipitor  80 mg daily.  Check lipids and liver as well as LP(a) in 8 weeks.  Goal LDL less than 55.   4 hypertension-blood pressure is elevated today but medications just initiated yesterday.  Follow and advance carvedilol  and Entresto as needed.   5 diabetes mellitus-needs improved control of glucose.  Per primary care.   6 noncompliance-when I discussed the importance of complying with medical therapy.  She has been taking none of her medications at home.   7 CVA-recent acute CVA-felt by neurology to be likely small vessel disease.  Continue aspirin , Plavix and statin.  Plan outpatient 30-day monitor at discharge.  8 chronic stage IIIa kidney disease-follow renal function closely with addition of Entresto.  For questions or updates, please contact [facility] Please consult [email or web address] for contact info under    Signed, Redell Shallow, MD  08/25/2024, 10:15 AM

## 2024-08-26 ENCOUNTER — Other Ambulatory Visit: Payer: Self-pay | Admitting: Cardiology

## 2024-08-26 ENCOUNTER — Encounter (HOSPITAL_COMMUNITY): Payer: Self-pay | Admitting: Internal Medicine

## 2024-08-26 ENCOUNTER — Other Ambulatory Visit (HOSPITAL_COMMUNITY): Payer: Self-pay

## 2024-08-26 DIAGNOSIS — I639 Cerebral infarction, unspecified: Secondary | ICD-10-CM | POA: Diagnosis not present

## 2024-08-26 DIAGNOSIS — I420 Dilated cardiomyopathy: Secondary | ICD-10-CM | POA: Diagnosis not present

## 2024-08-26 DIAGNOSIS — I5022 Chronic systolic (congestive) heart failure: Secondary | ICD-10-CM

## 2024-08-26 LAB — CBC
HCT: 36.9 % (ref 36.0–46.0)
Hemoglobin: 12.7 g/dL (ref 12.0–15.0)
MCH: 29.1 pg (ref 26.0–34.0)
MCHC: 34.4 g/dL (ref 30.0–36.0)
MCV: 84.4 fL (ref 80.0–100.0)
Platelets: 288 K/uL (ref 150–400)
RBC: 4.37 MIL/uL (ref 3.87–5.11)
RDW: 12.7 % (ref 11.5–15.5)
WBC: 9.8 K/uL (ref 4.0–10.5)
nRBC: 0 % (ref 0.0–0.2)

## 2024-08-26 LAB — GLUCOSE, CAPILLARY
Glucose-Capillary: 265 mg/dL — ABNORMAL HIGH (ref 70–99)
Glucose-Capillary: 276 mg/dL — ABNORMAL HIGH (ref 70–99)
Glucose-Capillary: 321 mg/dL — ABNORMAL HIGH (ref 70–99)

## 2024-08-26 LAB — MAGNESIUM: Magnesium: 1.8 mg/dL (ref 1.7–2.4)

## 2024-08-26 LAB — BASIC METABOLIC PANEL WITH GFR
Anion gap: 9 (ref 5–15)
BUN: 30 mg/dL — ABNORMAL HIGH (ref 8–23)
CO2: 23 mmol/L (ref 22–32)
Calcium: 8.8 mg/dL — ABNORMAL LOW (ref 8.9–10.3)
Chloride: 101 mmol/L (ref 98–111)
Creatinine, Ser: 1.54 mg/dL — ABNORMAL HIGH (ref 0.44–1.00)
GFR, Estimated: 36 mL/min — ABNORMAL LOW (ref >60)
Glucose, Bld: 252 mg/dL — ABNORMAL HIGH (ref 70–99)
Potassium: 3.6 mmol/L (ref 3.5–5.1)
Sodium: 133 mmol/L — ABNORMAL LOW (ref 135–145)

## 2024-08-26 LAB — PHOSPHORUS: Phosphorus: 4.7 mg/dL — ABNORMAL HIGH (ref 2.5–4.6)

## 2024-08-26 MED ORDER — SACUBITRIL-VALSARTAN 49-51 MG PO TABS
1.0000 | ORAL_TABLET | Freq: Two times a day (BID) | ORAL | 0 refills | Status: AC
  Filled 2024-08-26: qty 60, 30d supply, fill #0

## 2024-08-26 MED ORDER — CLOPIDOGREL BISULFATE 75 MG PO TABS
75.0000 mg | ORAL_TABLET | Freq: Every day | ORAL | 0 refills | Status: AC
  Filled 2024-08-26: qty 21, 21d supply, fill #0

## 2024-08-26 MED ORDER — SACUBITRIL-VALSARTAN 49-51 MG PO TABS
1.0000 | ORAL_TABLET | Freq: Two times a day (BID) | ORAL | Status: DC
  Administered 2024-08-26: 1 via ORAL
  Filled 2024-08-26 (×2): qty 1

## 2024-08-26 MED ORDER — CARVEDILOL 6.25 MG PO TABS
6.2500 mg | ORAL_TABLET | Freq: Two times a day (BID) | ORAL | 0 refills | Status: DC
  Filled 2024-08-26: qty 60, 30d supply, fill #0

## 2024-08-26 MED ORDER — ASPIRIN 81 MG PO CHEW
81.0000 mg | CHEWABLE_TABLET | Freq: Every day | ORAL | 2 refills | Status: AC
  Filled 2024-08-26: qty 30, 30d supply, fill #0

## 2024-08-26 MED ORDER — ATORVASTATIN CALCIUM 80 MG PO TABS
80.0000 mg | ORAL_TABLET | Freq: Every day | ORAL | 0 refills | Status: DC
  Filled 2024-08-26: qty 90, 90d supply, fill #0

## 2024-08-26 NOTE — TOC Transition Note (Signed)
 Transition of Care [facility]) - Discharge Note   Patient Details  Name: Sally Hendrix MRN: [id number] Date of Birth: [birth year]  Transition of [facility] CM/SW Contact:  [name], RN Phone Number: 08/26/2024, 11:35 AM   Clinical Narrative:     Patient discharging to home with family today, who will provide transportation. Referral sent to Neuro rehab for therapy. Info on AVS.   No other needs identified by CM.   Final next level of care: OP Rehab Barriers to Discharge: No Barriers Identified   Patient Goals and CMS Choice   CMS Medicare.gov Compare Post Acute Care list provided to:: Patient Choice offered to / list presented to : Patient      Discharge Placement                       Discharge Plan and Services Additional resources added to the After Visit Summary for                                       Social Drivers of Health (SDOH) Interventions SDOH Screenings   Food Insecurity: No Food Insecurity (08/23/2024)  Housing: Low Risk  (08/23/2024)  Transportation Needs: No Transportation Needs (08/23/2024)  Utilities: Not At Risk (08/23/2024)  Depression (PHQ2-9): Low Risk  (05/31/2020)  Social Connections: Moderately Isolated (08/23/2024)  Tobacco Use: Medium Risk (08/22/2024)     Readmission Risk Interventions     No data to display

## 2024-08-26 NOTE — Care Management Important Message (Signed)
 Important Message  Patient Details  Name: Sally Hendrix MRN: [id number] Date of Birth: 11-Apr-1956   Important Message Given:  Yes - Medicare IM     Jennie Laneta Dragon 08/26/2024, 2:37 PM

## 2024-08-26 NOTE — Progress Notes (Signed)
   Heart Failure Stewardship Pharmacist Progress Note   PCP: Yolande Toribio MATSU, MD PCP-Cardiologist: Redell Shallow, MD    HPI:  68 yo F with PMH of HTN, T2DM, CAD, OSA, seizures, and CHF.   LHC 3/208 with severe 1 vessel CAD with thrombotic plaque rupture in the proximal LAD s/p PCI with DES. Estimated EF 35-45%. Formal echo with EF 50-55%.   Presented to the ED on 11/17 with vertigo like dizziness for 3 days and multiple falls at home. Found to have multiple cortical infarcts on MRI. Echo 11/18 with LVEF 30-35%, global hypokinesis, mild LVH, RV normal.  Reports some shortness of breath on exertion or when she is laying down for long periods of time. Says it comes on all of a sudden. No LE edema on exam. Denies chest pain, palpitations, lightheadedness or dizziness. Has a pill box at home and understands that she will need to adjust this after discharge. Family has purchased a new BP cuff for her. She will begin checking daily BP after taking her morning medications.   Current HF Medications: Beta Blocker: carvedilol  6.25 mg BID ACE/ARB/ARNI: Entresto  49/51 mg BID  Prior to admission HF Medications: Diuretic: furosemide  20 mg three times weekly Beta blocker: carvedilol  37.5 mg BID  Pertinent Lab Values: Serum creatinine 1.79>1.54, BUN 30, Potassium 3.6, Sodium 133, Magnesium  1.8, A1c 11.1   Vital Signs: Weight: 145 lbs Blood pressure: 170-190/80s  Heart rate: 80s  I/O: incomplete  Medication Assistance / Insurance Benefits Check: Does the patient have prescription insurance?  Yes Type of insurance plan: [employer]   Outpatient Pharmacy:  Prior to admission outpatient pharmacy: [facility] Is the patient willing to use [facility] TOC pharmacy at discharge? Yes Is the patient willing to transition their outpatient pharmacy to utilize a [facility] outpatient pharmacy?   No    Assessment: 1. Chronic systolic CHF (LVEF 35-40%), due to presumed NICM with uncontrolled HTN,  still needs ischemic evaluation. NYHA class II symptoms. - Does not appear volume overloaded on exam - Continue carvedilol  6.25 mg BID - consider increasing to 12.5 mg BID next - Agree with increasing to Entresto  49/51 mg BID - Consider adding MRA at follow up pending renal function  - No SGLT2i - A1c 11%   Plan: 1) Medication changes recommended at this time: - Agree with changes - Increase carvedilol  next - Consider MRA at follow up  2) Patient assistance: - Entresto  copay $0  3)  Education  - Patient has been educated on current HF medications and potential additions to HF medication regimen - Patient verbalizes understanding that over the next few months, these medication doses may change and more medications may be added to optimize HF regimen - Patient has been educated on basic disease state pathophysiology and goals of therapy   Duwaine Plant, PharmD, BCPS Heart Failure Stewardship Pharmacist Phone [phone]

## 2024-08-26 NOTE — Progress Notes (Signed)
 Physical Therapy Treatment Patient Details Name: Sally Hendrix MRN: [id number] DOB: 1956-07-25 Today's Date: 08/26/2024   History of Present Illness Sally Hendrix is a 68 y.o. female presented to the ER with dizziness and multiple falls at home. Found to have small acute infarcts in the left subinsular white mater, right corona radiata, and right parietal lobe.As well as increase in size of a now 2.2 cm meningioma. PHMx:depression, CHF, hypertension, coronary artery disease, obesity, type 2 diabetes, seizures, sleep apnea    PT Comments  Pt received in supine and agreeable to session with her son and daughter present.  Pt able to perform gait and stair trials with up to CGA for safety. Pt reports some dizziness during stair trial requiring 1 seated rest break. Pt demonstrates slightly increased unsteadiness with balance challenges, specifically head turns, but no LOB. Education provided on reducing fall risk and managing dizziness. Pt continues to benefit from PT services to progress toward functional mobility goals.    If plan is discharge home, recommend the following: Assist for transportation;Assistance with cooking/housework;Help with stairs or ramp for entrance   Can travel by private vehicle        Equipment Recommendations  None recommended by PT (pt is encouraged to utilize her [facility])    Recommendations for Other Services       Precautions / Restrictions Precautions Precautions: Fall Recall of Precautions/Restrictions: Intact Restrictions Weight Bearing Restrictions Per Provider Order: No     Mobility  Bed Mobility Overal bed mobility: Independent                  Transfers Overall transfer level: Independent                      Ambulation/Gait Ambulation/Gait assistance: Supervision, Contact guard assist Gait Distance (Feet): 275 Feet Assistive device: None Gait Pattern/deviations: Step-through pattern       General Gait Details: Slowed, guarded  gait with CGA for safety during balance challenges   Stairs Stairs: Yes Stairs assistance: Supervision Stair Management: Two rails, Alternating pattern, Forwards Number of Stairs: 4     Wheelchair Mobility     Tilt Bed    Modified Rankin (Stroke Patients Only) Modified Rankin (Stroke Patients Only) Pre-Morbid Rankin Score: No symptoms Modified Rankin: Moderate disability     Balance Overall balance assessment: Needs assistance Sitting-balance support: No upper extremity supported, Feet supported Sitting balance-Leahy Scale: Good     Standing balance support: No upper extremity supported, During functional activity Standing balance-Leahy Scale: Good                   Standardized Balance Assessment Standardized Balance Assessment : Dynamic Gait Index   Dynamic Gait Index Level Surface: Normal Change in Gait Speed: Normal Gait with Horizontal Head Turns: Mild Impairment Gait with Vertical Head Turns: Mild Impairment Gait and Pivot Turn: Normal Step Over Obstacle: Mild Impairment Step Around Obstacles: Normal Steps: Mild Impairment Total Score: 20      Communication Communication Communication: No apparent difficulties  Cognition Arousal: Alert Behavior During Therapy: WFL for tasks assessed/performed   PT - Cognitive impairments: Memory, Problem solving                         Following commands: Intact      Cueing Cueing Techniques: Verbal cues  Exercises      General Comments        Pertinent Vitals/Pain Pain Assessment Pain Assessment:  Faces Faces Pain Scale: No hurt     PT Goals (current goals can now be found in the care plan section) Acute Rehab PT Goals Patient Stated Goal: home PT Goal Formulation: With patient Time For Goal Achievement: 09/06/24 Progress towards PT goals: Progressing toward goals    Frequency    Min 2X/week       AM-PAC PT 6 Clicks Mobility   Outcome Measure  Help needed turning  from your back to your side while in a flat bed without using bedrails?: None Help needed moving from lying on your back to sitting on the side of a flat bed without using bedrails?: None Help needed moving to and from a bed to a chair (including a wheelchair)?: None Help needed standing up from a chair using your arms (e.g., wheelchair or bedside chair)?: None Help needed to walk in hospital room?: A Little Help needed climbing 3-5 steps with a railing? : A Little 6 Click Score: 22    End of Session Equipment Utilized During Treatment: Gait belt Activity Tolerance: Patient tolerated treatment well Patient left: in bed;with call bell/phone within reach;with family/visitor present;with bed alarm set Nurse Communication: Mobility status PT Visit Diagnosis: Unsteadiness on feet (R26.81)     Time: 8799-8779 PT Time Calculation (min) (ACUTE ONLY): 20 min  Charges:    $Gait Training: 8-22 mins PT General Charges $$ ACUTE PT VISIT: 1 Visit                    Darryle George, PTA Acute Rehabilitation Services Secure Chat Preferred  [phone]    Darryle George 08/26/2024, 1:18 PM

## 2024-08-26 NOTE — Progress Notes (Signed)
 Ordering BMET to be completed in 1 week and 30 day cardiac monitor for CVA

## 2024-08-26 NOTE — Discharge Summary (Signed)
 Physician Discharge Summary  Sally Hendrix FMW:[id number] DOB: 1956/08/04 DOA: 08/22/2024  PCP: Yolande Toribio MATSU, MD  Admit date: 08/22/2024  Discharge date: 08/26/2024  Admitted From: Home  Disposition:  Home  Recommendations for Outpatient Follow-up:  Follow up with PCP in 1-2 weeks. Please obtain BMP/CBC in one week. Advised to follow-up with Cardiology in 4 weeks. Advised to take aspirin  and Plavix  for 3 weeks followed by aspirin  monotherapy. Advised to take Entresto  and Coreg  for systolic CHF. Advised to follow-up with Neurology as scheduled.  Home Health:None Equipment/Devices:None  Discharge Condition: Stable CODE STATUS:Full code Diet recommendation: Carb Modified   Brief Summary/ Hospital Course: This 68 yrs old female with PMH significant for depression, CHF, hypertension, coronary artery disease, obesity, type 2 diabetes, seizures, sleep apnea brought into the ED for dizziness with multiple falls at home.  Yesterday dizziness became more pronounced with associated nausea and vomiting and fall at home necessitating ED visit.  MRI shows small acute infarct in the left subinsular white matter, right corona radiator and right parietal lobe.  2.2 cm meningioma increased in size from 2019. Patient was admitted for further workup.  Neurology was consulted.  Stroke workup completed.  Echocardiogram shows LVEF 35 to 40% decreased LV function. Carotid duplex mild stenosis on both sides 1 to 39%.  MRA  > No large vessel occlusion.  Neurologist recommended aspirin  and Plavix  for 21 days followed by aspirin  monotherapy.  Lipitor  was increased from 40 to 80 mg daily. Cardiology was consulted and patient was started on Coreg  and Entresto  given low EF.  Patient felt much better and patient had a syncopal episode after having bowel movement during hospitalization which was likely vasovagal.  Patient felt much better next day and feels improved and wants to be discharged Home.    Discharge  Diagnoses:  Principal Problem:   Acute ischemic stroke (HCC)  Acute ischemic stroke: Acute vertigo likely peripheral: MRI of the brain showing small acute infarct in the left subinsular white matter, right corona radiata and the right parietal lobe. According to neurologist this vertigo is likely peripheral and stroke is incidental finding. Neurologist recommended stroke workup. MRA : No LVO, Moderate bilateral proximal P2 PCA stenosis.  Carotid Duplex  : Mild stenosis on both sided 1-39% Echo showed LVEF 35-40%, Decreased LV function. LDL 215, Hemoglobin A1c 11.1 Allow Permissive hypertension for first 24 hours. Patient completed swallow evaluation at bedside. Continue aspirin  and Plavix  for 21 days followed by aspirin  monotherapy. Continue Lipitor  80 mg daily Neurology signed off with outpatient follow-up.   Acute kidney injury: > Improving Mild hyponatremia likely secondary to dehydration. Patient presented with creatinine 1.78,  last known creatinine was 1.4 Continue IV hydration.  Monitor serum creatinine.   Meningioma: This has been chronic since 2019 however size now increased to 2.2 cm with minimal mass effect but no surrounding brain edema.  Patient will need nonemergent neurosurgical follow-up.   Chronic combined systolic and diastolic congestive heart failure: Echocardiogram shows LVEF 35 to 40%.  Left ventricular dysfunction. Daily weight , Monitor input and output. Cardiology is consulted.  Patient started on Entresto  and carvedilol  yesterday. Spironolactone  will be avoided in the setting of renal insufficiency. Once blood pressure controlled , would plan to repeat echo in 3 months later. She may need ischemic evaluation if LV function remains decreased at 3 months echo.   Essential hypertension: Coronary artery disease Antihypertensive are on hold due to permissive hypertension. Blood pressure medications resumed yesterday. Continue aspirin  and statin.  Insulin -dependent diabetes mellitus: Continue insulin  therapy. Monitor glucose level closely. Diabetic coordinator consulted.   Chronic seizures: Not on any home medication.   Hyperlipidemia: LDL above 200. Continue Lipitor  80 mg daily. Goal LDL below 55.   Vasovagal syncope: Patient had an episode of decreased mentation after using the bathroom for approximately 2 minutes. She denies any chest pain, no arrhythmia on telemetry.  Vitals normal.  Discharge Instructions  Discharge Instructions     Ambulatory referral to Neurology   Complete by: As directed    An appointment is requested in approximately: 8 weeks May see NP   Ambulatory referral to Physical Therapy   Complete by: As directed    Call MD for:  difficulty breathing, headache or visual disturbances   Complete by: As directed    Call MD for:  persistant dizziness or light-headedness   Complete by: As directed    Call MD for:  persistant nausea and vomiting   Complete by: As directed    Diet - low sodium heart healthy   Complete by: As directed    Diet Carb Modified   Complete by: As directed    Discharge instructions   Complete by: As directed    Advised to follow-up with primary care physician in 1 week. Advised to follow-up with cardiology in 4 weeks. Advised to take aspirin  and Plavix  for 3 weeks followed by aspirin  monotherapy. Advised to take Entresto  and Coreg  for systolic CHF. Advised to follow-up with neurology as scheduled.   Increase activity slowly   Complete by: As directed       Allergies as of 08/26/2024   No Known Allergies      Medication List     STOP taking these medications    aspirin  81 MG tablet Replaced by: Aspirin  Low Dose 81 MG chewable tablet       TAKE these medications    acetaminophen  500 MG tablet Commonly known as: TYLENOL  Take 1,000 mg by mouth every 6 (six) hours as needed for mild pain (pain score 1-3) or headache.   Aspirin  Low Dose 81 MG chewable  tablet Generic drug: aspirin  Chew 1 tablet (81 mg total) by mouth daily. Start taking on: August 27, 2024 Replaces: aspirin  81 MG tablet   atorvastatin  80 MG tablet Commonly known as: LIPITOR  Take 1 tablet (80 mg total) by mouth daily. Start taking on: August 27, 2024   Auvelity  45-105 MG Tbcr Generic drug: Dextromethorphan -buPROPion  ER Take 1 tablet by mouth in the morning.   carvedilol  6.25 MG tablet Commonly known as: COREG  Take 1 tablet (6.25 mg total) by mouth 2 (two) times daily with a meal. What changed:  medication strength how much to take when to take this   clopidogrel  75 MG tablet Commonly known as: PLAVIX  Take 1 tablet (75 mg total) by mouth daily for 21 days. Start taking on: August 27, 2024   furosemide  20 MG tablet Commonly known as: LASIX  Take 1 tablet (20 mg total) by mouth daily. What changed: when to take this   insulin  isophane & regular human KwikPen (70-30) 100 UNIT/ML KwikPen Commonly known as: NovoLIN 70/30 Kwikpen Inject 15 Units into the skin 2 (two) times daily before a meal. What changed:  how much to take when to take this   QC Tumeric Complex 500 MG Caps Generic drug: Turmeric Take 1 capsule by mouth in the morning.   sacubitril -valsartan  49-51 MG Commonly known as: ENTRESTO  Take 1 tablet by mouth 2 (two) times daily.  Follow-up Information     [facility]. Schedule an appointment as soon as possible for a visit.   Specialty: Rehabilitation Why: Please call to schedule an appointment. Contact information: [address] [place]  [address] [phone]        [facility] Schedule an appointment as soon as possible for a visit in 8 day(s).   Contact information: [address] [place] [address] [phone]         Yolande Toribio MATSU, MD Follow up in 1 week(s).   Specialty: Internal Medicine Contact information: [address] [place] [address] [phone]         [facility]. Go in 4 day(s).   Specialty: Cardiology Why: Hospital follow up 08/30/2024 @ 11:45 am PLEASE bring a current medication list to appointment FREE valet parking, Entrance [address] off [employer] for women and [employer] Tr[employer] information: [address] [place]  [phone]               No Known Allergies  Consultations: Neurology Cardiology  Procedures/Studies: ECHOCARDIOGRAM COMPLETE Result Date: 08/23/2024    ECHOCARDIOGRAM REPORT   Patient Name:   Sally Hendrix Date of Exam: 08/23/2024 Medical Rec #:  [id number]     Height:       62.5 in Accession #:    [id number]    Weight:       149.1 lb Date of Birth:  1956-04-22      BSA:          1.698 m Patient Age:    68 years      BP:           167/100 mmHg Patient Gender: F             HR:           89 bpm. Exam Location:  Inpatient Procedure: 2D Echo and Saline Contrast Bubble Study (Both Spectral and Color            Flow Doppler were utilized during procedure). Indications:    stroke  History:        Patient has no prior history of Echocardiogram examinations.                 CHF, Previous Myocardial Infarction, Stroke,                 Signs/Symptoms:Chest Pain; Risk Factors:Diabetes and                 Hypertension.  Sonographer:    Melissa Morford RDCS (AE, PE) Referring Phys: [phone] PRINCE T DJAN IMPRESSIONS  1. Left ventricular ejection fraction, by estimation, is 30 to 35%. The left ventricle has moderately decreased function. The left ventricle demonstrates global hypokinesis. There is mild left ventricular hypertrophy. Left ventricular diastolic parameters are indeterminate.  2. Right ventricular systolic function is normal. The right ventricular size is normal.  3. Trivial mitral valve regurgitation.  4. The aortic valve is tricuspid. Aortic valve regurgitation is  not visualized. FINDINGS  Left Ventricle: Left ventricular ejection fraction, by estimation, is 30 to 35%. The left ventricle has moderately decreased function. The left ventricle demonstrates global hypokinesis. The left ventricular internal cavity size was normal in size. There is mild left ventricular hypertrophy. Left ventricular diastolic parameters are indeterminate. Right Ventricle: The right ventricular size is normal. Right vetricular wall thickness was not  assessed. Right ventricular systolic function is normal. Left Atrium: Left atrial size was normal in size. Right Atrium: Right atrial size was normal in size. Pericardium: There is no evidence of pericardial effusion. Mitral Valve: There is mild thickening of the mitral valve leaflet(s). Mild mitral annular calcification. Trivial mitral valve regurgitation. Tricuspid Valve: The tricuspid valve is normal in structure. Tricuspid valve regurgitation is mild. Aortic Valve: The aortic valve is tricuspid. Aortic valve regurgitation is not visualized. Pulmonic Valve: The pulmonic valve was not well visualized. Pulmonic valve regurgitation is trivial. Aorta: The aortic root is normal in size and structure. IAS/Shunts: No atrial level shunt detected by color flow Doppler. Agitated saline contrast was given intravenously to evaluate for intracardiac shunting.  LEFT VENTRICLE PLAX 2D LVIDd:         4.10 cm LVIDs:         3.60 cm LV PW:         1.30 cm LV IVS:        1.20 cm LVOT diam:     2.00 cm LV SV:         33 LV SV Index:   20 LVOT Area:     3.14 cm  LV Volumes (MOD) LV vol d, MOD A2C: 100.0 ml LV vol d, MOD A4C: 108.0 ml LV vol s, MOD A2C: 59.8 ml LV vol s, MOD A4C: 80.0 ml LV SV MOD A2C:     40.2 ml LV SV MOD A4C:     108.0 ml LV SV MOD BP:      32.1 ml RIGHT VENTRICLE RV S prime:     8.59 cm/s LEFT ATRIUM           Index        RIGHT ATRIUM          Index LA diam:      2.40 cm 1.41 cm/m   RA Area:     9.28 cm LA Vol (A2C): 46.8 ml 27.57 ml/m  RA Volume:    17.30 ml 10.19 ml/m LA Vol (A4C): 28.3 ml 16.67 ml/m  AORTIC VALVE LVOT Vmax:   66.20 cm/s LVOT Vmean:  47.200 cm/s LVOT VTI:    0.106 m  AORTA Ao Root diam: 3.10 cm  SHUNTS Systemic VTI:  0.11 m Systemic Diam: 2.00 cm Vina Gull MD Electronically signed by Vina Gull MD Signature Date/Time: 08/23/2024/4:01:12 PM    Final    VAS US  CAROTID (at [facility] and [facility] only) Result Date: 08/23/2024 Carotid Arterial Duplex Study Patient Name:  JACIA SICKMAN  Date of Exam:   08/23/2024 Medical Rec #: [id number]      Accession #:    [id number] Date of Birth: October 05, 1956       Patient Gender: F Patient Age:   70 years Exam Location:  [facility] Procedure:      VAS US  CAROTID Referring Phys: DRUE POTTER --------------------------------------------------------------------------------  Indications:       TIA and dizziness with multiple falls. Risk Factors:      Hyperlipidemia, Diabetes, coronary artery disease. Other Factors:     Seizures,sleep apnea and obesity. Comparison Study:  No priors. Performing Technologist: Ricka Sturdivant-Jones RDMS, RVT  Examination Guidelines: A complete evaluation includes B-mode imaging, spectral Doppler, color Doppler, and power Doppler as needed of all accessible portions of each vessel. Bilateral testing is considered an integral part of a complete examination. Limited examinations for reoccurring indications may be performed as noted.  Right Carotid Findings: +----------+--------+--------+--------+------------------+------------------+  PSV cm/sEDV cm/sStenosisPlaque DescriptionComments           +----------+--------+--------+--------+------------------+------------------+ CCA Prox  56      21                                                   +----------+--------+--------+--------+------------------+------------------+ CCA Distal60      18                                                    +----------+--------+--------+--------+------------------+------------------+ ICA Prox  68      20      1-39%                     intimal thickening +----------+--------+--------+--------+------------------+------------------+ ICA Distal68      25                                                   +----------+--------+--------+--------+------------------+------------------+ ECA       112     16                                                   +----------+--------+--------+--------+------------------+------------------+ +----------+--------+-------+----------------+-------------------+           PSV cm/sEDV cmsDescribe        Arm Pressure (mmHG) +----------+--------+-------+----------------+-------------------+ [id number]             Multiphasic, WNL                    +----------+--------+-------+----------------+-------------------+ +---------+--------+--+--------+--+---------+ VertebralPSV cm/s40EDV cm/s15Antegrade +---------+--------+--+--------+--+---------+  Left Carotid Findings: +----------+--------+--------+--------+------------------+------------------+           PSV cm/sEDV cm/sStenosisPlaque DescriptionComments           +----------+--------+--------+--------+------------------+------------------+ CCA Prox  63      19              calcific                             +----------+--------+--------+--------+------------------+------------------+ CCA Distal79      27                                                   +----------+--------+--------+--------+------------------+------------------+ ICA Prox  61      26      1-39%                     intimal thickening +----------+--------+--------+--------+------------------+------------------+ ICA Distal55      26                                                   +----------+--------+--------+--------+------------------+------------------+  ECA       59      18                                                    +----------+--------+--------+--------+------------------+------------------+ +----------+--------+--------+----------------+-------------------+           PSV cm/sEDV cm/sDescribe        Arm Pressure (mmHG) +----------+--------+--------+----------------+-------------------+ Dlarojcpjw12              Multiphasic, WNL                    +----------+--------+--------+----------------+-------------------+ +---------+--------+--+--------+--+---------+ VertebralPSV cm/s42EDV cm/s19Antegrade +---------+--------+--+--------+--+---------+   Summary: Right Carotid: Velocities in the right ICA are consistent with a 1-39% stenosis. Left Carotid: Velocities in the left ICA are consistent with a 1-39% stenosis. Vertebrals:  Bilateral vertebral arteries demonstrate antegrade flow. Subclavians: Normal flow hemodynamics were seen in bilateral subclavian              arteries. *See table(s) above for measurements and observations.  Electronically signed by Debby Robertson on 08/23/2024 at 12:44:16 PM.    Final    MR ANGIO HEAD WO CONTRAST Result Date: 08/23/2024 EXAM: MR Angiography Head without intravenous contrast. 08/22/2024 11:56:00 PM TECHNIQUE: Magnetic resonance angiography images of the head without intravenous contrast. Multiplanar 2D and 3D reformatted images are provided for review. COMPARISON: None provided. CLINICAL HISTORY: Neuro deficit, acute, stroke suspected Neuro deficit, acute, stroke suspected FINDINGS: ANTERIOR CIRCULATION: No significant stenosis of the internal carotid arteries. No significant stenosis of the anterior cerebral arteries. No significant stenosis of the middle cerebral arteries. No aneurysm. POSTERIOR CIRCULATION: No significant stenosis of the posterior cerebral arteries. Moderate bilateral proximal P2 PCA stenosis. No significant stenosis of the basilar artery. No significant stenosis of the vertebral arteries. No aneurysm. IMPRESSION: 1. No  large vessel occlusion. 2. Moderate bilateral proximal P2 PCA stenosis. Electronically signed by: Gilmore Molt MD 08/23/2024 12:07 AM EST RP Workstation: [id number]   MR BRAIN WO CONTRAST Result Date: 08/22/2024 EXAM: MRI BRAIN WITHOUT CONTRAST 08/22/2024 06:00:03 PM TECHNIQUE: Multiplanar multisequence MRI of the head/brain was performed without the administration of intravenous contrast. COMPARISON: MRI head 05/03/18 CLINICAL HISTORY: Neuro deficit, acute, stroke suspected; dizziness FINDINGS: BRAIN AND VENTRICLES: Small acute infarct in the left subinsular white mater. Small acute infarct in the right corona radiata. Small acute infarct in the right parietal lobe. No intracranial hemorrhage. No midline shift. No hydrocephalus. Interval increase in size of a now 2.2 x 1.5 cm extraaxial dural based mass along the left frontal convexity (series 5, image 23) that is compatible with a mengioma. Minimal mass effect. No brain edema. Normal flow voids. ORBITS: No acute abnormality. SINUSES AND MASTOIDS: Opacified right maxillary sinus. Trace mastoid effusions. BONES AND SOFT TISSUES: Normal marrow signal. IMPRESSION: 1. Small acute infarcts in the left subinsular white mater, right corona radiata, and right parietal lobe. 2. Since 2019, increase in size of a now 2.2 cm meningioma. Minimal mass effect. No brain edema. 3. Opacified right maxillary sinus. Electronically signed by: Gilmore Molt MD 08/22/2024 06:54 PM EST RP Workstation: [id number]   CT Head Wo Contrast Result Date: 08/22/2024 EXAM: CT HEAD WITHOUT CONTRAST 08/22/2024 11:14:34 AM TECHNIQUE: CT of the head was performed without the administration of intravenous contrast. Automated exposure control, iterative reconstruction, and/or weight based adjustment of the mA/kV was utilized to reduce  the radiation dose to as low as reasonably achievable. COMPARISON: Head CT 05/03/2018 and MRI 05/05/2018 CLINICAL HISTORY: Dizziness, falls. Symptoms for 3  days. FINDINGS: BRAIN AND VENTRICLES: There is no evidence of an acute infarct, intracranial hemorrhage, midline shift, hydrocephalus, or extra-axial fluid collection. Cerebral volume is within normal limits for age. An extra-axial mass over the left frontal convexity has enlarged, now measuring 1.9 x 2.3 x 1.3 cm. There is minimal mass effect on the underlying left frontal brain parenchyma without evidence of edema. A chronic infarct in the anterior body of the corpus callosum to the left of midline is new. Calcified atherosclerosis at the skull base. ORBITS: Bilateral cataract extraction. SINUSES: Chronic right maxillary sinusitis. Small bilateral mastoid effusions. SOFT TISSUES AND SKULL: No acute soft tissue abnormality. No skull fracture. IMPRESSION: 1. No acute intracranial abnormality. 2. Interval enlargement of a now 2.3 cm left frontal convexity meningioma with minimal mass effect and no edema. 3. Interval chronic infarct in the corpus callosum. Electronically signed by: Dasie Hamburg MD 08/22/2024 11:24 AM EST RP Workstation: [id number]    Subjective: Patient was seen and examined at bedside.  Overnight events noted. Patient reports doing much better and wants to be discharged home.  Discharge Exam: Vitals:   08/26/24 0755 08/26/24 1136  BP: (!) 172/81 136/71  Pulse: 82 79  Resp: 18 18  Temp: 97.9 F (36.6 C) 98.6 F (37 C)  SpO2: 97% 96%   Vitals:   08/26/24 0028 08/26/24 0400 08/26/24 0755 08/26/24 1136  BP: (!) 193/92 (!) 173/87 (!) 172/81 136/71  Pulse: 77 81 82 79  Resp: 14 17 18 18   Temp: (!) 97.5 F (36.4 C) (!) 97.5 F (36.4 C) 97.9 F (36.6 C) 98.6 F (37 C)  TempSrc: Oral Oral  Oral  SpO2: 99% 96% 97% 96%  Weight:      Height:        General: Pt is alert, awake, not in acute distress Cardiovascular: RRR, S1/S2 +, no rubs, no gallops Respiratory: CTA bilaterally, no wheezing, no rhonchi Abdominal: Soft, NT, ND, bowel sounds + Extremities: no edema, no  cyanosis    The results of significant diagnostics from this hospitalization (including imaging, microbiology, ancillary and laboratory) are listed below for reference.     Microbiology: No results found for this or any previous visit (from the past 240 hours).   Labs: BNP (last 3 results) No results for input(s): BNP in the last 8760 hours. Basic Metabolic Panel: Recent Labs  Lab 08/22/24 1119 08/23/24 0516 08/24/24 0426 08/25/24 0354 08/26/24 0157  NA 133* 134* 139 136 133*  K 3.9 3.6 3.7 3.7 3.6  CL 96* 99 103 99 101  CO2 25 23 22 25 23   GLUCOSE 274* 190* 284* 205* 252*  BUN 29* 25* 28* 26* 30*  CREATININE 1.78* 1.62* 1.63* 1.79* 1.54*  CALCIUM  10.1 8.7* 8.7* 8.9 8.8*  MG  --   --  1.9  --  1.8  PHOS  --   --  4.6  --  4.7*   Liver Function Tests: Recent Labs  Lab 08/22/24 1119  AST 15  ALT 10  ALKPHOS 130*  BILITOT 0.4  PROT 7.0  ALBUMIN 3.8   No results for input(s): LIPASE, AMYLASE in the last 168 hours. No results for input(s): AMMONIA in the last 168 hours. CBC: Recent Labs  Lab 08/22/24 1119 08/23/24 0516 08/24/24 0426 08/26/24 0157  WBC 11.1* 11.7* 8.9 9.8  NEUTROABS 7.4  --   --   --  HGB 13.5 13.4 12.7 12.7  HCT 38.6 39.4 38.1 36.9  MCV 83.5 85.5 85.0 84.4  PLT 279 271 263 288   Cardiac Enzymes: No results for input(s): CKTOTAL, CKMB, CKMBINDEX, TROPONINI in the last 168 hours. BNP: Invalid input(s): POCBNP CBG: Recent Labs  Lab 08/25/24 1528 08/25/24 2119 08/26/24 0625 08/26/24 0909 08/26/24 1107  GLUCAP 289* 268* 265* 276* 321*   D-Dimer No results for input(s): DDIMER in the last 72 hours. Hgb A1c No results for input(s): HGBA1C in the last 72 hours. Lipid Profile No results for input(s): CHOL, HDL, LDLCALC, TRIG, CHOLHDL, LDLDIRECT in the last 72 hours. Thyroid  function studies No results for input(s): TSH, T4TOTAL, T3FREE, THYROIDAB in the last 72 hours.  Invalid input(s):  FREET3 Anemia work up No results for input(s): VITAMINB12, FOLATE, FERRITIN, TIBC, IRON, RETICCTPCT in the last 72 hours. Urinalysis    Component Value Date/Time   COLORURINE YELLOW 08/22/2024 1119   APPEARANCEUR HAZY (A) 08/22/2024 1119   LABSPEC 1.021 08/22/2024 1119   PHURINE 6.5 08/22/2024 1119   GLUCOSEU 250 (A) 08/22/2024 1119   HGBUR NEGATIVE 08/22/2024 1119   BILIRUBINUR NEGATIVE 08/22/2024 1119   KETONESUR NEGATIVE 08/22/2024 1119   PROTEINUR >300 (A) 08/22/2024 1119   NITRITE NEGATIVE 08/22/2024 1119   LEUKOCYTESUR LARGE (A) 08/22/2024 1119   Sepsis Labs Recent Labs  Lab 08/22/24 1119 08/23/24 0516 08/24/24 0426 08/26/24 0157  WBC 11.1* 11.7* 8.9 9.8   Microbiology No results found for this or any previous visit (from the past 240 hours).   Time coordinating discharge: Over 30 minutes  SIGNED:   Darcel Dawley, MD  [facility] 08/26/2024, 5:11 PM Pager   If 7PM-7AM, please contact night-coverage

## 2024-08-26 NOTE — Plan of Care (Signed)
  Problem: Ischemic Stroke/TIA Tissue Perfusion: Goal: Complications of ischemic stroke/TIA will be minimized Outcome: Progressing   Problem: Coping: Goal: Will verbalize positive feelings about self Outcome: Progressing Goal: Will identify appropriate support needs Outcome: Progressing   Problem: Clinical Measurements: Goal: Ability to maintain clinical measurements within normal limits will improve Outcome: Progressing Goal: Will remain free from infection Outcome: Progressing Goal: Diagnostic test results will improve Outcome: Progressing Goal: Respiratory complications will improve Outcome: Progressing Goal: Cardiovascular complication will be avoided Outcome: Progressing

## 2024-08-26 NOTE — Progress Notes (Signed)
 Heart Failure Nurse Navigator Progress Note  PCP: Yolande Toribio MATSU, MD PCP-Cardiologist: Pietro Admission Diagnosis: Dizziness Admitted from: Home  Presentation:   Sally Hendrix presented with vertigo like dizziness for 3 days, vomiting and falling over. Per family she has been somewhat confused. BP 160/102, HR 91, CT head with no acute intercranial abnormality, enlargement of the left frontal convexity meningioma with minimal mass effect and no edema. MRI with multiple cortical infarcts.Carotid Dopplers show 1 to 39% bilateral stenosis.  Echo 11/18 with LVEF 30-35%, global hypokinesis, mild LVH, RV normal. Patient reports no extremity edema.    Patient and her son were educated on the sign and symptoms of heart failure, daily weights, when to call her doctor or go to the ED. Diet/ fluid restrictions, reports that she drinks a number of Mexican Cokes, continued education on taking all her medications as prescribed, son is going to buy her a pill box. Verbalized her understanding of attending all medical appointments and the education provided. A HF TOC appointment was scheduled for 08/30/2024 @ 11:45 am.   ECHO/ LVEF: 30-35%  Clinical Course:  Past Medical History:  Diagnosis Date   Cervical cancer (HCC) 1980s   CHF (congestive heart failure) (HCC)    Depression    all my life (05/03/2018)   Glaucoma    History of kidney stones    Hypertension    Myocardial infarction (HCC) 12/10/2016   Nonischemic cardiomyopathy (HCC)    Obesity    Seizure (HCC) 05/02/2018 X2-3   Sleep apnea    not dx'd (05/03/2018)   Type II diabetes mellitus (HCC)      Social History   Socioeconomic History   Marital status: Married    Spouse name: Not on file   Number of children: 6   Years of education: Not on file   Highest education level: Not on file  Occupational History   Occupation: Education Officer, Museum: [employer]  Tobacco Use   Smoking status: Former    Current  packs/day: 0.00    Types: Cigarettes    Quit date: 1979    Years since quitting: 46.9   Smokeless tobacco: Never   Tobacco comments:    smoked alot for 1 yr  Vaping Use   Vaping status: Never Used  Substance and Sexual Activity   Alcohol  use: Not Currently    Comment: 05/03/2018 nothing in the last 10 yrs; take too much RX   Drug use: No   Sexual activity: Not on file  Other Topics Concern   Not on file  Social History Narrative   Not on file   Social Drivers of Health   Financial Resource Strain: Not on file  Food Insecurity: No Food Insecurity (08/23/2024)   Hunger Vital Sign    Worried About Running Out of Food in the Last Year: Never true    Ran Out of Food in the Last Year: Never true  Transportation Needs: No Transportation Needs (08/23/2024)   PRAPARE - Administrator, Civil Service (Medical): No    Lack of Transportation (Non-Medical): No  Physical Activity: Not on file  Stress: Not on file  Social Connections: Moderately Isolated (08/23/2024)   Social Connection and Isolation Panel    Frequency of Communication with Friends and Family: More than three times a week    Frequency of Social Gatherings with Friends and Family: More than three times a week    Attends Religious Services: Never  Active Member of Clubs or Organizations: No    Attends Banker Meetings: Never    Marital Status: Married   Water Engineer and Provision:  Detailed education and instructions provided on heart failure disease management including the following:  Signs and symptoms of Heart Failure When to call the physician Importance of daily weights Low sodium diet Fluid restriction Medication management Anticipated future follow-up appointments  Patient education given on each of the above topics.  Patient acknowledges understanding via teach back method and acceptance of all instructions.  Education Materials:  Living Better With Heart Failure  Booklet, HF zone tool, & Daily Weight Tracker Tool.  Patient has scale at home: Yes Patient has pill box at home: Son will buy her one.     High Risk Criteria for Readmission and/or Poor Patient Outcomes: Heart failure hospital admissions (last 6 months): 0  No Show rate: 15% Difficult social situation: No, lives with her husband and 2 of their children.  Demonstrates medication adherence: No Primary Language: English  Literacy level: Reading, writing, and comprehension   Barriers of Care:   Medication compliance Diet/ fluid restrictions ( drinks Mexican Coke cola's) Daily weights  Considerations/Referrals:   Referral made to Heart Failure Pharmacist Stewardship: Yes Referral made to Heart Failure CSW/NCM TOC: NA Referral made to Heart & Vascular TOC clinic: Yes, 08/30/2024 @ 11 :45 am   Items for Follow-up on DC/TOC: Medication compliance Diet/ fluid restrictions/ daily weights   Stephane Haddock, BSN, RN Heart Failure Teacher, Adult Education Only

## 2024-08-26 NOTE — Progress Notes (Signed)
 Rounding Note   Patient Name: Sally Hendrix Date of Encounter: 08/26/2024  [facility] Cardiologist: Redell Shallow, MD   Subjective Patient denies CP or dyspnea  Scheduled Meds:  aspirin   81 mg Oral Daily   atorvastatin   80 mg Oral Daily   carvedilol   6.25 mg Oral BID WC   clopidogrel   75 mg Oral Daily   Dextromethorphan -buPROPion  ER  1 tablet Oral q morning   enoxaparin  (LOVENOX ) injection  30 mg Subcutaneous Daily   insulin  aspart  0-5 Units Subcutaneous QHS   insulin  aspart  0-9 Units Subcutaneous TID WC   insulin  glargine-yfgn  12 Units Subcutaneous Q24H   sacubitril -valsartan   1 tablet Oral BID   Continuous Infusions:  PRN Meds: acetaminophen  **OR** acetaminophen  (TYLENOL ) oral liquid 160 mg/5 mL **OR** acetaminophen , hydrALAZINE , ondansetron  (ZOFRAN ) IV, senna-docusate   Vital Signs  Vitals:   08/25/24 1952 08/26/24 0028 08/26/24 0400 08/26/24 0755  BP: 139/81 (!) 193/92 (!) 173/87 (!) 172/81  Pulse: 81 77 81 82  Resp: 15 14 17 18   Temp: 98.8 F (37.1 C) (!) 97.5 F (36.4 C) (!) 97.5 F (36.4 C) 97.9 F (36.6 C)  TempSrc: Oral Oral Oral   SpO2: 93% 99% 96% 97%  Weight:      Height:       No intake or output data in the 24 hours ending 08/26/24 1007    08/23/2024    2:51 PM 05/16/2021    1:33 PM 01/23/2020   11:17 AM  Last 3 Weights  Weight (lbs) 145 lb 8.1 oz 149 lb 2 oz 149 lb  Weight (kg) 66 kg 67.643 kg 67.586 kg      Telemetry Sinus- Personally Reviewed   Physical Exam  GEN: NAD Neck: Supple Cardiac: RRR Respiratory: CTA GI: Soft, NT/ND MS: No edema Neuro:  Grossly intact Psych: Normal affect   Labs  Chemistry Recent Labs  Lab 08/22/24 1119 08/23/24 0516 08/24/24 0426 08/25/24 0354 08/26/24 0157  NA 133*   < > 139 136 133*  K 3.9   < > 3.7 3.7 3.6  CL 96*   < > 103 99 101  CO2 25   < > 22 25 23   GLUCOSE 274*   < > 284* 205* 252*  BUN 29*   < > 28* 26* 30*  CREATININE 1.78*   < > 1.63* 1.79* 1.54*  CALCIUM   10.1   < > 8.7* 8.9 8.8*  MG  --   --  1.9  --  1.8  PROT 7.0  --   --   --   --   ALBUMIN 3.8  --   --   --   --   AST 15  --   --   --   --   ALT 10  --   --   --   --   ALKPHOS 130*  --   --   --   --   BILITOT 0.4  --   --   --   --   GFRNONAA 30*   < > 34* 30* 36*  ANIONGAP 12   < > 14 12 9    < > = values in this interval not displayed.    Lipids  Recent Labs  Lab 08/22/24 2129  CHOL 322*  TRIG 349*  HDL 37*  LDLCALC 215*  CHOLHDL 8.7    Hematology Recent Labs  Lab 08/23/24 0516 08/24/24 0426 08/26/24 0157  WBC 11.7* 8.9 9.8  RBC  4.61 4.48 4.37  HGB 13.4 12.7 12.7  HCT 39.4 38.1 36.9  MCV 85.5 85.0 84.4  MCH 29.1 28.3 29.1  MCHC 34.0 33.3 34.4  RDW 12.9 12.9 12.7  PLT 271 263 288      Patient Profile   68 year old female with past medical history of uncontrolled hypertension, diabetes mellitus, coronary artery disease, obstructive sleep apnea, seizures, reduced LV function improved admitted with CVA for evaluation of cardiomyopathy. Patient had PCI of the LAD in 2018. Echocardiogram at that time showed ejection fraction 50 to 55% and mild left atrial enlargement. She has been readmitted with CVA. MRA showed no large vessel occlusion with moderate bilateral proximal P2 PCA stenosis. MRI showed small acute infarcts in the left subinsular white matter, right corona radiata and right parietal lobe. Carotid Dopplers show 1 to 39% bilateral stenosis. Echocardiogram showed ejection fraction 30 to 35%, mild left ventricular hypertrophy.   Assessment & Plan  1 cardiomyopathy-felt to be possibly hypertensive mediated.  She does have a history of coronary disease but has not had chest pain and no history of alcohol  abuse.  Plan is medical therapy.  Continue Entresto  and carvedilol  but will increase to 49/51 twice daily and 6.25 twice daily as blood pressure remains high.  Will avoid spironolactone  in the setting of renal insufficiency.  Adjust medications as an outpatient based  on follow-up blood pressure readings.  Once blood pressure controlled would plan to repeat echocardiogram 3 months later.  If LV function remains decreased would need ischemia evaluation.     2 coronary artery disease-patient has a history of coronary disease but denies chest pain.  Continue aspirin  and statin.   3 severe hyperlipidemia-LDL greater than 200.  Continue Lipitor  80 mg daily.  Check lipids and liver as well as LP(a) in 8 weeks.  Goal LDL less than 55.   4 hypertension-blood pressure is elevated.  Will increase Entresto  to 49/51 twice daily and carvedilol  to 6.25 twice daily.  Follow blood pressure as an outpatient and adjust regimen as needed.   5 diabetes mellitus-needs improved control of glucose.  Per primary care.   6 noncompliance-I previously discussed the importance of complying with medical therapy.  She has been taking none of her medications at home.   7 CVA-recent acute CVA-felt by neurology to be likely small vessel disease.  Continue aspirin , Plavix  and statin.  Plan outpatient 30-day monitor at discharge.  8 chronic stage IIIa kidney disease-follow renal function closely with addition of Entresto .  Needs be met 1 week after discharge.  Will arrange outpatient 30-day monitor.  Follow-up with APP 2 weeks and me in 3 months.  For questions or updates, please contact [facility] Please consult [email or web address] for contact info under    Signed, Redell Shallow, MD  08/26/2024, 10:07 AM

## 2024-08-29 ENCOUNTER — Telehealth (HOSPITAL_COMMUNITY): Payer: Self-pay

## 2024-08-29 NOTE — Telephone Encounter (Signed)
 Called to confirm/remind patient of their appointment at the [facility] on 08/30/24 11:45.   Appointment:   [x] Confirmed  [] Left mess   [] No answer/No voice mail  [] VM Full/unable to leave message  [] Phone not in service  Patient reminded to bring all medications and/or complete list.  Confirmed patient has transportation. Gave directions, instructed to utilize valet parking.

## 2024-08-29 NOTE — Progress Notes (Signed)
 HEART & VASCULAR TRANSITION OF CARE CONSULT NOTE     PCP: Yolande Toribio MATSU, MD   Chief Complaint: chronic HFrEF  HPI: Referred to clinic by Dr. Leotis for heart failure consultation.   Sally Hendrix is a 68 y.o. female with CAD, chronic HFrEF, HTN, DMII, obesity, seizures, sleep apnea and depression.   EF 10 % back in 2011, followed by Dr. Cherrie. Suspected HTN CM. EF improved to 60% 12' on GDMT. Graduated from AHF.   Readmitted 3/18 with EF back down to 40%. Ran out of her medications at that time and was readmitted with chest pain and SOB. LHC showed 80% stenosis in prox LAD, s/p DES. GDMT restarted. Followed by Dr. Calhoun. Echo post PCI showed EF up to 50-55%.   Admitted 11/25 with recurrent dizziness with falls. MRI showed small acute infarct in the L subinsular white matter, R corona radiator and R parietal lobe. 2.2 cm meningioma increased in size from 19'. Neurology was consulted. Carotid duplex mild stenosis on both sides 1 to 39%.  MRA  > No large vessel occlusion. Neurology recommended ASA/Plavix  for 21 days followed by ASA monotherapy. Echo showed EF 30-35%, LV with GHK, RV normal.   Today she presents for AHF [facility] clinic visit with her son. Overall feeling ok. Denies palpitations, CP, dizziness, edema, or PND/Orthopnea. Intt SOB. Appetite poor. No fever or chills. Weight at home 132 pounds. Taking all medications. Denies ETOH, tobacco or drug use. Drinking ~32oz/home. SBP at home 130s-160s.    Past Medical History:  Diagnosis Date   Cervical cancer (HCC) 1980s   CHF (congestive heart failure) (HCC)    Depression    all my life (05/03/2018)   Glaucoma    History of kidney stones    Hypertension    Myocardial infarction (HCC) 12/10/2016   Nonischemic cardiomyopathy (HCC)    Obesity    Seizure (HCC) 05/02/2018 X2-3   Sleep apnea    not dx'd (05/03/2018)   Type II diabetes mellitus (HCC)     Current Outpatient Medications  Medication Sig Dispense Refill    acetaminophen  (TYLENOL ) 500 MG tablet Take 1,000 mg by mouth every 6 (six) hours as needed for mild pain (pain score 1-3) or headache.     aspirin  81 MG chewable tablet Chew 1 tablet (81 mg total) by mouth daily. 30 tablet 2   atorvastatin  (LIPITOR ) 80 MG tablet Take 1 tablet (80 mg total) by mouth daily. 90 tablet 0   carvedilol  (COREG ) 6.25 MG tablet Take 1 tablet (6.25 mg total) by mouth 2 (two) times daily with a meal. 60 tablet 0   clopidogrel  (PLAVIX ) 75 MG tablet Take 1 tablet (75 mg total) by mouth daily for 21 days. 21 tablet 0   Dextromethorphan -buPROPion  ER (AUVELITY ) 45-105 MG TBCR Take 1 tablet by mouth in the morning.     furosemide  (LASIX ) 20 MG tablet Take 1 tablet (20 mg total) by mouth daily. (Patient taking differently: Take 20 mg by mouth 3 (three) times a week.) 30 tablet 0   Insulin  Isophane & Regular Human (NOVOLIN 70/30 FLEXPEN RELION) (70-30) 100 UNIT/ML PEN Inject 15 Units into the skin 2 (two) times daily before a meal. (Patient taking differently: Inject 12 Units into the skin 2 (two) times daily.) 15 mL 0   sacubitril -valsartan  (ENTRESTO ) 49-51 MG Take 1 tablet by mouth 2 (two) times daily. 60 tablet 0   Turmeric (QC TUMERIC COMPLEX) 500 MG CAPS Take 1 capsule by mouth in  the morning.     No current facility-administered medications for this encounter.    No Known Allergies    Social History   Socioeconomic History   Marital status: Married    Spouse name: Blane   Number of children: 6   Years of education: Not on file   Highest education level: High school graduate  Occupational History   Occupation: Education Officer, Museum: [employer]   Occupation: retired  Tobacco Use   Smoking status: Former    Current packs/day: 0.00    Types: Cigarettes    Quit date: 1979    Years since quitting: 46.9   Smokeless tobacco: Never   Tobacco comments:    smoked alot for 1 yr  Vaping Use   Vaping status: Never Used  Substance and Sexual  Activity   Alcohol  use: Not Currently    Comment: 05/03/2018 nothing in the last 10 yrs; take too much RX   Drug use: No   Sexual activity: Not on file  Other Topics Concern   Not on file  Social History Narrative   Not on file   Social Drivers of Health   Financial Resource Strain: Low Risk  (08/26/2024)   Overall Financial Resource Strain (CARDIA)    Difficulty of Paying Living Expenses: Not hard at all  Food Insecurity: No Food Insecurity (08/23/2024)   Hunger Vital Sign    Worried About Running Out of Food in the Last Year: Never true    Ran Out of Food in the Last Year: Never true  Transportation Needs: No Transportation Needs (08/26/2024)   PRAPARE - Administrator, Civil Service (Medical): No    Lack of Transportation (Non-Medical): No  Physical Activity: Not on file  Stress: Not on file  Social Connections: Moderately Isolated (08/23/2024)   Social Connection and Isolation Panel    Frequency of Communication with Friends and Family: More than three times a week    Frequency of Social Gatherings with Friends and Family: More than three times a week    Attends Religious Services: Never    Database Administrator or Organizations: No    Attends Banker Meetings: Never    Marital Status: Married  Catering Manager Violence: Not At Risk (08/23/2024)   Humiliation, Afraid, Rape, and Kick questionnaire    Fear of Current or Ex-Partner: No    Emotionally Abused: No    Physically Abused: No    Sexually Abused: No      Family History  Problem Relation Age of Onset   Heart failure Mother    Cirrhosis Mother        hx of - died of nonalcoholic cirrhosis   Coronary artery disease Father 65   Heart attack Father    Depression Father    Other Brother        brain tumor   Heart attack Maternal Grandfather    Colon cancer Paternal Aunt    Pancreatic cancer Cousin    Hyperlipidemia Son     Vitals:   08/30/24 1146  BP: 136/78  Pulse: 84  SpO2:  97%  Weight: 65.8 kg (145 lb)  Height: 5' 3 (1.6 m)    PHYSICAL EXAM: General:  well appearing.  No respiratory difficulty. Walked into clinic.  Neck: JVD flat.  Cor: Regular rate & rhythm. No murmurs. Lungs: clear Extremities: no edema  Neuro: alert & oriented x 3. Affect pleasant.   ECG: none today  ASSESSMENT & PLAN:  Chronic HFrEF - Echo 11/25 EF 30-35%, LV with GHK, RV normal.  - NYHA II - Volume stable on exam. Continue lasix  20 mg daily - Continue coreg  6.25 mg BID - Continue Entresto  49-51 mg BID - GDMT limited with renal function but down trending at discharge.  - Start spiro 12.5 mg daily. BMET/BNP today. Repeat BMET in 7-10 days.  - No SGLT2i at this time with A1c 11.1. Plan to start once A1c <10.  - Plan for repeat echo in ~2-3 months on good GDMT and controlled BP. If EF has not improved then can reconsider ischemic eval.  - Refer to cardiac rehab - Supposed to be getting a 30 day cardiac monitor in the mail per neurology recommendations, still awaiting this.   HTN - longstanding hx - BP elevated today, changes as above  CAD  - LHC 09/2010 with normal coronaries  - LHC 2018 with severe 1v CAD w/ thrombotic plaque rupture in the proc LAD. DES placed to prox LAD - Continue statin, plavix / ASA - Denies CP - Plan as above, if EF does not improve can consider repeating ischemic eval.   HLD  - LDL 215 - Continue statin - Refer to lipid clinic with numerous CV risk factors, needs aggressive lipid mgmt. Goal LDL <55%.   Pill splitter provided today.   Referred to HFSW (PCP, Medications, Transportation, ETOH Abuse, Drug Abuse, Insurance, Financial ): No Refer to Pharmacy:  No Refer to Home Health:  No Refer to Advanced Heart Failure Clinic: No  Refer to General Cardiology: Patient of: Redell Shallow, MD   Follow up in ~6 weeks to attempt to titrate GDMT further. Previously graduated from AHF clinic, goal would be for EF to improve again and follow with [facility].

## 2024-08-30 ENCOUNTER — Ambulatory Visit (HOSPITAL_COMMUNITY)
Admit: 2024-08-30 | Discharge: 2024-08-30 | Disposition: A | Discharge status: Home / Self Care | Attending: Internal Medicine | Admitting: Internal Medicine

## 2024-08-30 ENCOUNTER — Ambulatory Visit (HOSPITAL_COMMUNITY): Payer: Self-pay | Admitting: Internal Medicine

## 2024-08-30 ENCOUNTER — Encounter (HOSPITAL_COMMUNITY): Payer: Self-pay

## 2024-08-30 VITALS — BP 136/78 | HR 84 | Ht 63.0 in | Wt 145.0 lb

## 2024-08-30 DIAGNOSIS — E785 Hyperlipidemia, unspecified: Secondary | ICD-10-CM | POA: Insufficient documentation

## 2024-08-30 DIAGNOSIS — I110 Hypertensive heart disease with heart failure: Secondary | ICD-10-CM | POA: Diagnosis not present

## 2024-08-30 DIAGNOSIS — Z794 Long term (current) use of insulin: Secondary | ICD-10-CM | POA: Insufficient documentation

## 2024-08-30 DIAGNOSIS — Z79899 Other long term (current) drug therapy: Secondary | ICD-10-CM | POA: Diagnosis not present

## 2024-08-30 DIAGNOSIS — Z7982 Long term (current) use of aspirin: Secondary | ICD-10-CM | POA: Insufficient documentation

## 2024-08-30 DIAGNOSIS — G4730 Sleep apnea, unspecified: Secondary | ICD-10-CM | POA: Diagnosis not present

## 2024-08-30 DIAGNOSIS — Z9181 History of falling: Secondary | ICD-10-CM | POA: Diagnosis not present

## 2024-08-30 DIAGNOSIS — Z6825 Body mass index (BMI) 25.0-25.9, adult: Secondary | ICD-10-CM | POA: Insufficient documentation

## 2024-08-30 DIAGNOSIS — G40909 Epilepsy, unspecified, not intractable, without status epilepticus: Secondary | ICD-10-CM | POA: Insufficient documentation

## 2024-08-30 DIAGNOSIS — E782 Mixed hyperlipidemia: Secondary | ICD-10-CM

## 2024-08-30 DIAGNOSIS — I5022 Chronic systolic (congestive) heart failure: Secondary | ICD-10-CM | POA: Insufficient documentation

## 2024-08-30 DIAGNOSIS — I2510 Atherosclerotic heart disease of native coronary artery without angina pectoris: Secondary | ICD-10-CM | POA: Insufficient documentation

## 2024-08-30 DIAGNOSIS — E669 Obesity, unspecified: Secondary | ICD-10-CM | POA: Diagnosis not present

## 2024-08-30 DIAGNOSIS — I10 Essential (primary) hypertension: Secondary | ICD-10-CM

## 2024-08-30 DIAGNOSIS — F32A Depression, unspecified: Secondary | ICD-10-CM | POA: Diagnosis not present

## 2024-08-30 LAB — BASIC METABOLIC PANEL WITH GFR
Anion gap: 10 (ref 5–15)
BUN: 32 mg/dL — ABNORMAL HIGH (ref 8–23)
CO2: 22 mmol/L (ref 22–32)
Calcium: 9.2 mg/dL (ref 8.9–10.3)
Chloride: 103 mmol/L (ref 98–111)
Creatinine, Ser: 1.77 mg/dL — ABNORMAL HIGH (ref 0.44–1.00)
GFR, Estimated: 31 mL/min — ABNORMAL LOW (ref >60)
Glucose, Bld: 284 mg/dL — ABNORMAL HIGH (ref 70–99)
Potassium: 4.6 mmol/L (ref 3.5–5.1)
Sodium: 135 mmol/L (ref 135–145)

## 2024-08-30 LAB — BRAIN NATRIURETIC PEPTIDE: B Natriuretic Peptide: 31.9 pg/mL (ref 0.0–100.0)

## 2024-08-30 MED ORDER — SPIRONOLACTONE 25 MG PO TABS: 25.0000 mg | ORAL_TABLET | Freq: Every day | ORAL | 3 refills | Status: AC

## 2024-08-30 NOTE — Patient Instructions (Addendum)
 Good to see you today!  START Spironolactone  12.5 mg(1/2 tablet) daily  Labs done today, your results will be available in MyChart, we will contact you for abnormal readings.  Follow up lab work as scheduled  You have been referred to lipid clinic they will call to schedule  You have been referred to cardiac rehab  Your physician recommends that you schedule a follow-up appointment in: as scheduled   If you have any questions or concerns before your next appointment please send us  a message through [place] or call our office at [phone].    TO LEAVE A MESSAGE FOR THE NURSE SELECT OPTION 2, PLEASE LEAVE A MESSAGE INCLUDING: YOUR NAME DATE OF BIRTH CALL BACK NUMBER REASON FOR CALL**this is important as we prioritize the call backs  YOU WILL RECEIVE A CALL BACK THE SAME DAY AS LONG AS YOU CALL BEFORE 4:00 PM At the [facility], you and your health needs are our priority. As part of our continuing mission to provide you with exceptional heart care, we have created designated Provider Care Teams. These Care Teams include your primary Cardiologist (physician) and Advanced Practice Providers (APPs- Physician Assistants and Nurse Practitioners) who all work together to provide you with the care you need, when you need it.   You may see any of the following providers on your designated Care Team at your next follow up: Dr Toribio Fuel Dr Ezra Shuck Dr. Morene Brownie Greig Mosses, NP Caffie Shed, [place] [facility] [place], [place] Beckey Coe, NP Jordan Lee, NP Ellouise Class, NP Tinnie Redman, PharmD Jaun Bash, PharmD   Please be sure to bring in all your medications bottles to every appointment.    Thank you for choosing [facility]

## 2024-08-31 ENCOUNTER — Telehealth (HOSPITAL_COMMUNITY): Payer: Self-pay

## 2024-08-31 ENCOUNTER — Encounter (HOSPITAL_COMMUNITY): Payer: Self-pay

## 2024-08-31 NOTE — Telephone Encounter (Signed)
 Pt returned phone call and is interested in the cardiac rehab program. Received permission from pt to speak with her son. Will pass pt to nurse navigator for review.

## 2024-08-31 NOTE — Addendum Note (Signed)
 Encounter addended by: Tita Andriette NOVAK, RN on: 08/31/2024 3:56 PM  Actions taken: Order list changed, Diagnosis association updated

## 2024-09-05 ENCOUNTER — Ambulatory Visit: Admitting: Physical Therapy

## 2024-09-05 DIAGNOSIS — D320 Benign neoplasm of cerebral meninges: Secondary | ICD-10-CM | POA: Diagnosis not present

## 2024-09-05 DIAGNOSIS — R2681 Unsteadiness on feet: Secondary | ICD-10-CM

## 2024-09-05 DIAGNOSIS — I2510 Atherosclerotic heart disease of native coronary artery without angina pectoris: Secondary | ICD-10-CM | POA: Diagnosis not present

## 2024-09-05 DIAGNOSIS — I639 Cerebral infarction, unspecified: Secondary | ICD-10-CM

## 2024-09-05 DIAGNOSIS — N179 Acute kidney failure, unspecified: Secondary | ICD-10-CM | POA: Diagnosis not present

## 2024-09-05 DIAGNOSIS — N184 Chronic kidney disease, stage 4 (severe): Secondary | ICD-10-CM | POA: Diagnosis not present

## 2024-09-05 DIAGNOSIS — E785 Hyperlipidemia, unspecified: Secondary | ICD-10-CM | POA: Diagnosis not present

## 2024-09-05 DIAGNOSIS — Z8673 Personal history of transient ischemic attack (TIA), and cerebral infarction without residual deficits: Secondary | ICD-10-CM | POA: Diagnosis not present

## 2024-09-05 DIAGNOSIS — Z91148 Patient's other noncompliance with medication regimen for other reason: Secondary | ICD-10-CM | POA: Diagnosis not present

## 2024-09-05 DIAGNOSIS — Z794 Long term (current) use of insulin: Secondary | ICD-10-CM | POA: Diagnosis not present

## 2024-09-05 DIAGNOSIS — E1151 Type 2 diabetes mellitus with diabetic peripheral angiopathy without gangrene: Secondary | ICD-10-CM | POA: Diagnosis not present

## 2024-09-05 DIAGNOSIS — I130 Hypertensive heart and chronic kidney disease with heart failure and stage 1 through stage 4 chronic kidney disease, or unspecified chronic kidney disease: Secondary | ICD-10-CM | POA: Diagnosis not present

## 2024-09-05 DIAGNOSIS — Z8669 Personal history of other diseases of the nervous system and sense organs: Secondary | ICD-10-CM | POA: Diagnosis not present

## 2024-09-05 DIAGNOSIS — I5041 Acute combined systolic (congestive) and diastolic (congestive) heart failure: Secondary | ICD-10-CM | POA: Diagnosis not present

## 2024-09-05 LAB — BASIC METABOLIC PANEL WITH GFR
EGFR: 26.2
EGFR: 31.7

## 2024-09-05 NOTE — Therapy (Signed)
 [facility] [address] [place], [place], [address] Phone: [phone]   Fax:  [phone]  Patient Details - ARRIVED NO CHARGE Name: Sally Hendrix MRN: [id number] Date of Birth: 1955/11/15 Referring Provider:  Leotis Bogus, MD  Encounter Date: 09/05/2024  Pt wanting to complete cardiac rehab prior to neuro rehab and did not realize she missed the call.  She has concerns for her EF and wants input from cardiac rehab so provided number to cardiac rehab and she reports she will have her other son call and schedule for her.  Son Medford present with her today took information down for pt and will pass off to his brother.  Informed pt to call and reschedule neuro eval when ready and referral is good for 6 months.  Daved KATHEE Bull, PT, DPT 09/05/2024, 1:26 PM  [facility] [address] [place], [place], [address] Phone: [phone]   Fax:  [phone]

## 2024-09-06 ENCOUNTER — Ambulatory Visit (HOSPITAL_COMMUNITY)

## 2024-09-07 ENCOUNTER — Telehealth (HOSPITAL_COMMUNITY): Payer: Self-pay

## 2024-09-07 NOTE — Telephone Encounter (Signed)
 Spoke with patient's son, informed him our schedule is booked out until January and that schedule is not currently open yet.

## 2024-09-09 ENCOUNTER — Ambulatory Visit: Attending: Physician Assistant | Admitting: Physician Assistant

## 2024-09-09 VITALS — BP 154/86 | HR 84 | Ht 63.0 in | Wt 140.8 lb

## 2024-09-09 DIAGNOSIS — D329 Benign neoplasm of meninges, unspecified: Secondary | ICD-10-CM | POA: Diagnosis not present

## 2024-09-09 DIAGNOSIS — R42 Dizziness and giddiness: Secondary | ICD-10-CM

## 2024-09-09 DIAGNOSIS — I639 Cerebral infarction, unspecified: Secondary | ICD-10-CM

## 2024-09-09 DIAGNOSIS — R55 Syncope and collapse: Secondary | ICD-10-CM | POA: Diagnosis not present

## 2024-09-09 DIAGNOSIS — E782 Mixed hyperlipidemia: Secondary | ICD-10-CM

## 2024-09-09 DIAGNOSIS — I2510 Atherosclerotic heart disease of native coronary artery without angina pectoris: Secondary | ICD-10-CM | POA: Diagnosis not present

## 2024-09-09 DIAGNOSIS — I10 Essential (primary) hypertension: Secondary | ICD-10-CM | POA: Diagnosis not present

## 2024-09-09 DIAGNOSIS — I5022 Chronic systolic (congestive) heart failure: Secondary | ICD-10-CM

## 2024-09-09 NOTE — Progress Notes (Signed)
 Cardiology Office Note   Date:  09/09/2024  ID:  Sally Hendrix, DOB 02/07/1956, MRN [id number] PCP: Sally Toribio MATSU, MD  [facility] Providers Cardiologist:  Redell Shallow, MD   History of Present Illness Sally Hendrix is a 68 y.o. female with a past medical history significant for depression, CHF, hypertension, coronary artery disease, obesity, type 2 diabetes mellitus, seizures, OSA who presented to the ED with a chief complaint of dizziness and multiple falls at home.  The day prior to ED visit the dizziness became more prominent and was associated with nausea and vomiting and she fell at home with a necessary ED follow-up.  MRI showed small acute infarct in the left side of the angular white matter, right corona radiator and right parietal lobe.  2.2 cm meningioma increased in size from 2019.  Patient was admitted for further workup and neurology was consulted.  Stroke workup completed.  Echo showed LVEF 35 to 40% with decreased LV function.  Carotid duplex showed mild stenosis on both sides 1 to 39%.  MRA showed no large vessel occlusion.  Neurologist recommended aspirin  and Plavix  for 21 days followed by aspirin  monotherapy.  Lipitor  was increased from 40 to 80 mg daily.  Cardiology consulted and patient was started on carvedilol  and Entresto  given low EF.  She felt much better and had a syncopal episode after having a bowel movement during hospitalization which was most likely vasovagal.  Since she was feeling better the following day she wanted to be discharged home.  Today, she presents with a hx of coronary artery disease for follow-up after recent hospitalization.  Since discharge she feels more energetic and better able to perform daily activities.  For the past two days she has had dizziness when standing after sitting for a long time, worse if she stands quickly. She has had no falls or syncope.  Her current medications are carvedilol , Entresto , Lasix  20 mg daily,  spironolactone  25 mg daily, aspirin , and Plavix . She checks blood pressure at home and notes variable readings, with some as high as 160/92, measured before taking medications. She later mentions she is unsure if she is taking some of her medications. We have encouraged her to check her pill bottles when she gets home and call us  to let us  know if she is not taking some of them.  She has no chest pain. She was admitted on November 17 and is nearing completion of a 21-day course of aspirin  and Plavix .  She was started on Lipitor  for elevated LDL cholesterol prior to initiation of the medication.  Reports no shortness of breath nor dyspnea on exertion. Reports no chest pain, pressure, or tightness. No edema, orthopnea, PND. Reports no palpitations.   Discussed the use of AI scribe software for clinical note transcription with the patient, who gave verbal consent to proceed.   ROS: Pertinent ROS in HPI  Studies Reviewed EKG Interpretation Date/Time:  Friday September 09 2024 14:53:32 EST Ventricular Rate:  84 PR Interval:  218 QRS Duration:  102 QT Interval:  416 QTC Calculation: 491 R Axis:   83  Text Interpretation: Sinus rhythm with 1st degree A-V block Prolonged QT When compared with ECG of 22-Aug-2024 11:40, PREVIOUS ECG IS PRESENT Confirmed by Lucien Blanc [phone]) on 09/09/2024 3:24:05 PM   Echo 08/23/24  IMPRESSIONS     1. Left ventricular ejection fraction, by estimation, is 30 to 35%. The  left ventricle has moderately decreased function. The left ventricle  demonstrates global  hypokinesis. There is mild left ventricular  hypertrophy. Left ventricular diastolic  parameters are indeterminate.   2. Right ventricular systolic function is normal. The right ventricular  size is normal.   3. Trivial mitral valve regurgitation.   4. The aortic valve is tricuspid. Aortic valve regurgitation is not  visualized.       Physical Exam VS:  BP (!) 154/86   Pulse 84   Ht 5' 3 (1.6  m)   Wt 140 lb 12.8 oz (63.9 kg)   SpO2 97%   BMI 24.94 kg/m        Wt Readings from Last 3 Encounters:  09/09/24 140 lb 12.8 oz (63.9 kg)  08/30/24 145 lb (65.8 kg)  08/23/24 145 lb 8.1 oz (66 kg)    GEN: Well nourished, well developed in no acute distress NECK: No JVD; No carotid bruits CARDIAC: RRR, no murmurs, rubs, gallops RESPIRATORY:  Clear to auscultation without rales, wheezing or rhonchi  ABDOMEN: Soft, non-tender, non-distended EXTREMITIES:  No edema; No deformity   ASSESSMENT AND PLAN  Chronic systolic heart failure Improved energy and reduced dyspnea with carvedilol  and Entresto . No peripheral edema. Stable weight. - Continue carvedilol  and Entresto . - Monitor weight and symptoms of fluid overload. - Update echocardiogram in three months to assess response to Entresto . (If truly taking it)   Primary hypertension Variable blood pressure readings. Current medications include Entresto  and Lasix . Pre-medication readings less informative. - Monitor blood pressure one hour after morning and evening doses of Entresto . - Ensure accurate medication list and adherence.  Coronary artery disease without angina No recent chest pain. No intervention required. - Continue current management and monitor for symptoms of angina.  Mixed hyperlipidemia LDL cholesterol previously high. Lipitor  recently started. - Recheck cholesterol levels in a couple of months to assess response to Lipitor .  Orthostatic hypotension/Syncope/Dizziness Dizziness upon standing consistent with orthostatic hypotension. Slightly elevated blood pressure today. Dehydration may exacerbate symptoms. - Increase fluid intake, especially in the morning.(Goal 50-60 ounces) - Monitor blood pressure regularly. - Apply Holter monitor for 30 days to assess heart rhythm.  History of acute ischemic stroke Currently on dual antiplatelet therapy. Plan to transition to aspirin  monotherapy after 21 days  post-admission. - Discontinue Plavix  on December 8th, 2024, and continue aspirin  monotherapy. - Follow up with neurology on December 22nd, 2024, to confirm plan.     Cardiac Rehabilitation Eligibility Assessment  The patient is ready to start cardiac rehabilitation from a cardiac standpoint.     Dispo: She can follow-up in 2 to 3 months with Dr. Pietro  Signed, Orren LOISE Fabry, PA-C

## 2024-09-09 NOTE — Patient Instructions (Signed)
 Medication Instructions:  OK to continue with plan to stop Plavix  (Clopidogrel ) per neurology on Dec. 8th. *If you need a refill on your cardiac medications before your next appointment, please call your pharmacy*  Lab Work: Fasting Lipid and CMET 1 week prior to Feb. Follow-up If you have labs (blood work) drawn today and your tests are completely normal, you will receive your results only by: MyChart Message (if you have MyChart) OR A paper copy in the mail If you have any lab test that is abnormal or we need to change your treatment, we will call you to review the results.  Testing/Procedures: None ordered  Follow-Up: At [facility], you and your health needs are our priority.  As part of our continuing mission to provide you with exceptional heart care, our providers are all part of one team.  This team includes your primary Cardiologist (physician) and Advanced Practice Providers or APPs (Physician Assistants and Nurse Practitioners) who all work together to provide you with the care you need, when you need it.  Your next appointment:   2 month(s)  Provider:   Redell Shallow, MD    We recommend signing up for the patient portal called MyChart.  Sign up information is provided on this After Visit Summary.  MyChart is used to connect with patients for Virtual Visits (Telemedicine).  Patients are able to view lab/test results, encounter notes, upcoming appointments, etc.  Non-urgent messages can be sent to your provider as well.   To learn more about what you can do with MyChart, go to [email or web address].

## 2024-09-22 NOTE — Progress Notes (Signed)
 SMANTHA BOAKYE                                          MRN: [id number]   09/22/2024   The VBCI Quality Team Specialist reviewed this patient medical record for the purposes of chart review for care gap closure. The following were reviewed: chart review for care gap closure-kidney health evaluation for diabetes:eGFR  and uACR.    VBCI Quality Team

## 2024-09-23 ENCOUNTER — Other Ambulatory Visit (HOSPITAL_COMMUNITY): Payer: Self-pay | Admitting: Neurology

## 2024-09-23 DIAGNOSIS — E7800 Pure hypercholesterolemia, unspecified: Secondary | ICD-10-CM | POA: Insufficient documentation

## 2024-09-26 ENCOUNTER — Inpatient Hospital Stay: Admitting: Adult Health

## 2024-10-05 ENCOUNTER — Telehealth: Payer: Self-pay

## 2024-10-05 NOTE — Telephone Encounter (Signed)
 Dr. Rosemarie, patient will be scheduled as soon as possible.  Auth Submission: APPROVED Site of care: Site of care: [facility] Payer: [employer] Medication & CPT/J Code(s) submitted: Leqvio (Inclisiran) J1306 Diagnosis Code:  Route of submission (phone, fax, portal): Latent Phone # Fax # Auth type: Buy/Bill PB Units/visits requested: 284mg  x 1 dose Reference number: [id number] Approval from: 09/26/24 to 12/25/24

## 2024-10-10 ENCOUNTER — Telehealth (HOSPITAL_COMMUNITY): Payer: Self-pay

## 2024-10-10 NOTE — Telephone Encounter (Signed)
 Called to confirm/remind patient of their appointment at the [facility] on 10/11/24 11:00.   Patients needs to reschedule appointment.

## 2024-10-10 NOTE — Progress Notes (Incomplete)
 "    HEART & VASCULAR TRANSITION OF CARE CONSULT NOTE     PCP: Yolande Toribio MATSU, MD   HPI: Referred to clinic by Dr. Leotis for heart failure consultation.   Sally Hendrix is a 69 y.o. female with CAD, chronic HFrEF, HTN, DMII, obesity, seizures, sleep apnea and depression.   EF 10 % back in 2011, followed by Dr. Cherrie. Suspected HTN CM. EF improved to 60% 12' on GDMT. Graduated from AHF.   Readmitted 3/18 with EF back down to 40%. Ran out of her medications at that time and was readmitted with chest pain and SOB. LHC showed 80% stenosis in prox LAD, s/p DES. GDMT restarted. Followed by Dr. Calhoun. Echo post PCI showed EF up to 50-55%.   Admitted 11/25 with recurrent dizziness with falls. MRI showed small acute infarct in the L subinsular white matter, R corona radiator and R parietal lobe. 2.2 cm meningioma increased in size from 19'. Neurology was consulted. Carotid duplex mild stenosis on both sides 1 to 39%.  MRA  > No large vessel occlusion. Neurology recommended ASA/Plavix  for 21 days followed by ASA monotherapy. Echo showed EF 30-35%, LV with GHK, RV normal.   Seen post hospital in [facility] clinic feeling ok, GDMT titrated. She was then seen 09/09/24 by [facility] again doing well.    08/30/24: Today she presents for AHF [facility] clinic visit with her son. Overall feeling ok. Denies palpitations, CP, dizziness, edema, or PND/Orthopnea. Intt SOB. Appetite poor. No fever or chills. Weight at home 132 pounds. Taking all medications. Denies ETOH, tobacco or drug use. Drinking ~32oz/home. SBP at home 130s-160s.   She presents today for transition of care follow up. Overall feeling ***. NYHA ***. Reports {Symptoms; cardiac:12860::dyspnea,fatigue}. Denies {Symptoms; cardiac:12860::chest pain,dyspnea,fatigue,near-syncope,orthopnea,palpitations,dizziness,abnormal bleeding}. Able to perform ADLs. Appetite okay. Weight at home ***. BP at home***. Compliant with all medications. Denies  ETOH, tobacco, or drug use.    Past Medical History:  Diagnosis Date   Cervical cancer (HCC) 1980s   CHF (congestive heart failure) (HCC)    Depression    all my life (05/03/2018)   Glaucoma    History of kidney stones    Hypertension    Myocardial infarction (HCC) 12/10/2016   Nonischemic cardiomyopathy (HCC)    Obesity    Seizure (HCC) 05/02/2018 X2-3   Sleep apnea    not dx'd (05/03/2018)   Type II diabetes mellitus (HCC)     Current Outpatient Medications  Medication Sig Dispense Refill   acetaminophen  (TYLENOL ) 500 MG tablet Take 1,000 mg by mouth every 6 (six) hours as needed for mild pain (pain score 1-3) or headache.     aspirin  81 MG chewable tablet Chew 1 tablet (81 mg total) by mouth daily. 30 tablet 2   atorvastatin  (LIPITOR ) 80 MG tablet Take 1 tablet (80 mg total) by mouth daily. 90 tablet 0   carvedilol  (COREG ) 6.25 MG tablet Take 1 tablet (6.25 mg total) by mouth 2 (two) times daily with a meal. 60 tablet 0   Dextromethorphan -buPROPion  ER (AUVELITY ) 45-105 MG TBCR Take 1 tablet by mouth in the morning.     furosemide  (LASIX ) 20 MG tablet Take 1 tablet (20 mg total) by mouth daily. (Patient taking differently: Take 20 mg by mouth 3 (three) times a week.) 30 tablet 0   Insulin  Isophane & Regular Human (NOVOLIN 70/30 FLEXPEN RELION) (70-30) 100 UNIT/ML PEN Inject 15 Units into the skin 2 (two) times daily before a meal. (Patient taking differently: Inject  12 Units into the skin 2 (two) times daily.) 15 mL 0   spironolactone  (ALDACTONE ) 25 MG tablet Take 1 tablet (25 mg total) by mouth daily. (Patient not taking: Reported on 09/09/2024) 90 tablet 3   Turmeric (QC TUMERIC COMPLEX) 500 MG CAPS Take 1 capsule by mouth in the morning.     No current facility-administered medications for this visit.    No Known Allergies    Social History   Socioeconomic History   Marital status: Married    Spouse name: Blane   Number of children: 6   Years of education: Not on  file   Highest education level: High school graduate  Occupational History   Occupation: Education Officer, Museum: [employer]   Occupation: retired  Tobacco Use   Smoking status: Former    Current packs/day: 0.00    Types: Cigarettes    Quit date: 1979    Years since quitting: 47.0   Smokeless tobacco: Never   Tobacco comments:    smoked alot for 1 yr  Vaping Use   Vaping status: Never Used  Substance and Sexual Activity   Alcohol  use: Not Currently    Comment: 05/03/2018 nothing in the last 10 yrs; take too much RX   Drug use: No   Sexual activity: Not on file  Other Topics Concern   Not on file  Social History Narrative   Not on file   Social Drivers of Health   Tobacco Use: Medium Risk (08/30/2024)   Patient History    Smoking Tobacco Use: Former    Smokeless Tobacco Use: Never    Passive Exposure: Not on Actuary Strain: Low Risk (08/26/2024)   Overall Financial Resource Strain (CARDIA)    Difficulty of Paying Living Expenses: Not hard at all  Food Insecurity: No Food Insecurity (08/23/2024)   Epic    Worried About Programme Researcher, Broadcasting/film/video in the Last Year: Never true    Ran Out of Food in the Last Year: Never true  Transportation Needs: No Transportation Needs (08/26/2024)   Epic    Lack of Transportation (Medical): No    Lack of Transportation (Non-Medical): No  Physical Activity: Not on file  Stress: Not on file  Social Connections: Moderately Isolated (08/23/2024)   Social Connection and Isolation Panel    Frequency of Communication with Friends and Family: More than three times a week    Frequency of Social Gatherings with Friends and Family: More than three times a week    Attends Religious Services: Never    Database Administrator or Organizations: No    Attends Banker Meetings: Never    Marital Status: Married  Catering Manager Violence: Not At Risk (08/23/2024)   Epic    Fear of Current or Ex-Partner: No     Emotionally Abused: No    Physically Abused: No    Sexually Abused: No  Depression (PHQ2-9): Not on file  Alcohol  Screen: Low Risk (08/26/2024)   Alcohol  Screen    Last Alcohol  Screening Score (AUDIT): 0  Housing: Unknown (08/26/2024)   Epic    Unable to Pay for Housing in the Last Year: No    Number of Times Moved in the Last Year: Not on file    Homeless in the Last Year: No  Utilities: Not At Risk (08/23/2024)   Epic    Threatened with loss of utilities: No  Health Literacy: Not on file  Family History  Problem Relation Age of Onset   Heart failure Mother    Cirrhosis Mother        hx of - died of nonalcoholic cirrhosis   Coronary artery disease Father 13   Heart attack Father    Depression Father    Other Brother        brain tumor   Heart attack Maternal Grandfather    Colon cancer Paternal Aunt    Pancreatic cancer Cousin    Hyperlipidemia Son     There were no vitals filed for this visit.  There were no vitals filed for this visit.  PHYSICAL EXAM: General: *** appearing. No distress  Cardiac: JVP ~*** cm. No murmurs  Resp: Lung sounds clear and equal B/L Abdomen: Soft, non-distended.  Extremities: Warm and dry.  *** edema.  Neuro: A&O x3. Affect pleasant.   ECG: none today  ASSESSMENT & PLAN:  Chronic HFrEF - Echo 11/25 EF 30-35%, LV with GHK, RV normal.  - NYHA II - Volume stable on exam. Continue lasix  20 mg daily - Continue coreg  6.25 mg BID - Continue Entresto  49-51 mg BID - GDMT limited with renal function but down trending at discharge.  - Start spiro 12.5 mg daily. BMET/BNP today. Repeat BMET in 7-10 days.  - No SGLT2i at this time with A1c 11.1. Plan to start once A1c <10.  - Plan for repeat echo in ~2-3 months on good GDMT and controlled BP. If EF has not improved then can reconsider ischemic eval.  - Refer to cardiac rehab - Supposed to be getting a 30 day cardiac monitor in the mail per neurology recommendations, still awaiting  this.   HTN - longstanding hx - BP elevated today, changes as above  CAD  - LHC 09/2010 with normal coronaries  - LHC 2018 with severe 1v CAD w/ thrombotic plaque rupture in the proc LAD. DES placed to prox LAD - Continue statin, plavix / ASA - Denies CP - Plan as above, if EF does not improve can consider repeating ischemic eval.   HLD  - LDL 215 - Continue statin - Refer to lipid clinic with numerous CV risk factors, needs aggressive lipid mgmt. Goal LDL <55%.   Pill splitter provided today.   Referred to HFSW (PCP, Medications, Transportation, ETOH Abuse, Drug Abuse, Insurance, Financial ): No Refer to Pharmacy:  No Refer to Home Health:  No Refer to Advanced Heart Failure Clinic: No  Refer to General Cardiology: Patient of: Redell Shallow, MD   Follow up in ~6 weeks to attempt to titrate GDMT further. Previously graduated from AHF clinic, goal would be for EF to improve again and follow with [facility].   Meya Clutter, NP 11:23 AM  "

## 2024-10-11 ENCOUNTER — Ambulatory Visit: Payer: Self-pay | Admitting: Cardiology

## 2024-10-11 ENCOUNTER — Encounter: Payer: Self-pay | Admitting: Physician Assistant

## 2024-10-11 ENCOUNTER — Ambulatory Visit

## 2024-10-11 ENCOUNTER — Ambulatory Visit (HOSPITAL_COMMUNITY)

## 2024-10-11 DIAGNOSIS — I639 Cerebral infarction, unspecified: Secondary | ICD-10-CM

## 2024-10-13 NOTE — Progress Notes (Incomplete)
 "    HEART & VASCULAR TRANSITION OF CARE CONSULT NOTE     PCP: Yolande Toribio MATSU, MD   HPI: Referred to clinic by Dr. Leotis for heart failure consultation.   Sally Hendrix is a 69 y.o. female with CAD, chronic HFrEF, HTN, DMII, obesity, seizures, sleep apnea and depression.   EF 10 % back in 2011, followed by Dr. Cherrie. Suspected HTN CM. EF improved to 60% 12' on GDMT. Graduated from AHF.   Readmitted 3/18 with EF back down to 40%. Ran out of her medications at that time and was readmitted with chest pain and SOB. LHC showed 80% stenosis in prox LAD, s/p DES. GDMT restarted. Followed by Dr. Calhoun. Echo post PCI showed EF up to 50-55%.   Admitted 11/25 with recurrent dizziness with falls. MRI showed small acute infarct in the L subinsular white matter, R corona radiator and R parietal lobe. 2.2 cm meningioma increased in size from 19'. Neurology was consulted. Carotid duplex mild stenosis on both sides 1 to 39%.  MRA  > No large vessel occlusion. Neurology recommended ASA/Plavix  for 21 days followed by ASA monotherapy. Echo showed EF 30-35%, LV with GHK, RV normal.   Seen post hospital in [facility] clinic feeling ok, GDMT titrated. She was then seen 09/09/24 by [facility] again doing well.    08/30/24: Today she presents for AHF [facility] clinic visit with her son. Overall feeling ok. Denies palpitations, CP, dizziness, edema, or PND/Orthopnea. Intt SOB. Appetite poor. No fever or chills. Weight at home 132 pounds. Taking all medications. Denies ETOH, tobacco or drug use. Drinking ~32oz/home. SBP at home 130s-160s.   She presents today for transition of care follow up. Overall feeling ***. NYHA ***. Reports {Symptoms; cardiac:12860::dyspnea,fatigue}. Denies {Symptoms; cardiac:12860::chest pain,dyspnea,fatigue,near-syncope,orthopnea,palpitations,dizziness,abnormal bleeding}. Able to perform ADLs. Appetite okay. Weight at home ***. BP at home***. Compliant with all medications. Denies  ETOH, tobacco, or drug use.    Past Medical History:  Diagnosis Date   Cervical cancer (HCC) 1980s   CHF (congestive heart failure) (HCC)    Depression    all my life (05/03/2018)   Glaucoma    History of kidney stones    Hypertension    Myocardial infarction (HCC) 12/10/2016   Nonischemic cardiomyopathy (HCC)    Obesity    Seizure (HCC) 05/02/2018 X2-3   Sleep apnea    not dx'd (05/03/2018)   Type II diabetes mellitus (HCC)     Current Outpatient Medications  Medication Sig Dispense Refill   acetaminophen  (TYLENOL ) 500 MG tablet Take 1,000 mg by mouth every 6 (six) hours as needed for mild pain (pain score 1-3) or headache.     aspirin  81 MG chewable tablet Chew 1 tablet (81 mg total) by mouth daily. 30 tablet 2   atorvastatin  (LIPITOR ) 80 MG tablet Take 1 tablet (80 mg total) by mouth daily. 90 tablet 0   carvedilol  (COREG ) 6.25 MG tablet Take 1 tablet (6.25 mg total) by mouth 2 (two) times daily with a meal. 60 tablet 0   Dextromethorphan -buPROPion  ER (AUVELITY ) 45-105 MG TBCR Take 1 tablet by mouth in the morning.     furosemide  (LASIX ) 20 MG tablet Take 1 tablet (20 mg total) by mouth daily. (Patient taking differently: Take 20 mg by mouth 3 (three) times a week.) 30 tablet 0   Insulin  Isophane & Regular Human (NOVOLIN 70/30 FLEXPEN RELION) (70-30) 100 UNIT/ML PEN Inject 15 Units into the skin 2 (two) times daily before a meal. (Patient taking differently: Inject  12 Units into the skin 2 (two) times daily.) 15 mL 0   spironolactone  (ALDACTONE ) 25 MG tablet Take 1 tablet (25 mg total) by mouth daily. (Patient not taking: Reported on 09/09/2024) 90 tablet 3   Turmeric (QC TUMERIC COMPLEX) 500 MG CAPS Take 1 capsule by mouth in the morning.     No current facility-administered medications for this visit.    No Known Allergies    Social History   Socioeconomic History   Marital status: Married    Spouse name: Blane   Number of children: 6   Years of education: Not on  file   Highest education level: High school graduate  Occupational History   Occupation: Education Officer, Museum: [employer]   Occupation: retired  Tobacco Use   Smoking status: Former    Current packs/day: 0.00    Types: Cigarettes    Quit date: 1979    Years since quitting: 47.0   Smokeless tobacco: Never   Tobacco comments:    smoked alot for 1 yr  Vaping Use   Vaping status: Never Used  Substance and Sexual Activity   Alcohol  use: Not Currently    Comment: 05/03/2018 nothing in the last 10 yrs; take too much RX   Drug use: No   Sexual activity: Not on file  Other Topics Concern   Not on file  Social History Narrative   Not on file   Social Drivers of Health   Tobacco Use: Medium Risk (08/30/2024)   Patient History    Smoking Tobacco Use: Former    Smokeless Tobacco Use: Never    Passive Exposure: Not on Actuary Strain: Low Risk (08/26/2024)   Overall Financial Resource Strain (CARDIA)    Difficulty of Paying Living Expenses: Not hard at all  Food Insecurity: No Food Insecurity (08/23/2024)   Epic    Worried About Programme Researcher, Broadcasting/film/video in the Last Year: Never true    Ran Out of Food in the Last Year: Never true  Transportation Needs: No Transportation Needs (08/26/2024)   Epic    Lack of Transportation (Medical): No    Lack of Transportation (Non-Medical): No  Physical Activity: Not on file  Stress: Not on file  Social Connections: Moderately Isolated (08/23/2024)   Social Connection and Isolation Panel    Frequency of Communication with Friends and Family: More than three times a week    Frequency of Social Gatherings with Friends and Family: More than three times a week    Attends Religious Services: Never    Database Administrator or Organizations: No    Attends Banker Meetings: Never    Marital Status: Married  Catering Manager Violence: Not At Risk (08/23/2024)   Epic    Fear of Current or Ex-Partner: No     Emotionally Abused: No    Physically Abused: No    Sexually Abused: No  Depression (PHQ2-9): Not on file  Alcohol  Screen: Low Risk (08/26/2024)   Alcohol  Screen    Last Alcohol  Screening Score (AUDIT): 0  Housing: Unknown (08/26/2024)   Epic    Unable to Pay for Housing in the Last Year: No    Number of Times Moved in the Last Year: Not on file    Homeless in the Last Year: No  Utilities: Not At Risk (08/23/2024)   Epic    Threatened with loss of utilities: No  Health Literacy: Not on file  Family History  Problem Relation Age of Onset   Heart failure Mother    Cirrhosis Mother        hx of - died of nonalcoholic cirrhosis   Coronary artery disease Father 31   Heart attack Father    Depression Father    Other Brother        brain tumor   Heart attack Maternal Grandfather    Colon cancer Paternal Aunt    Pancreatic cancer Cousin    Hyperlipidemia Son     There were no vitals filed for this visit.  There were no vitals filed for this visit.  PHYSICAL EXAM: General: *** appearing. No distress  Cardiac: JVP ~*** cm. No murmurs  Resp: Lung sounds clear and equal B/L Abdomen: Soft, non-distended.  Extremities: Warm and dry.  *** edema.  Neuro: A&O x3. Affect pleasant.   ECG: none today  ASSESSMENT & PLAN:  Chronic HFrEF - Echo 11/25 EF 30-35%, LV with GHK, RV normal.  - NYHA II - Volume stable on exam. Continue lasix  20 mg daily - Continue coreg  6.25 mg BID - Continue Entresto  49-51 mg BID - GDMT limited with renal function but down trending at discharge.  - Start spiro 12.5 mg daily. BMET/BNP today. Repeat BMET in 7-10 days.  - No SGLT2i at this time with A1c 11.1. Plan to start once A1c <10.  - Plan for repeat echo in ~2-3 months on good GDMT and controlled BP. If EF has not improved then can reconsider ischemic eval.  - Refer to cardiac rehab - Supposed to be getting a 30 day cardiac monitor in the mail per neurology recommendations, still awaiting  this.   HTN - longstanding hx - BP elevated today, changes as above  CAD  - LHC 09/2010 with normal coronaries  - LHC 2018 with severe 1v CAD w/ thrombotic plaque rupture in the proc LAD. DES placed to prox LAD - Continue statin, plavix / ASA - Denies CP - Plan as above, if EF does not improve can consider repeating ischemic eval.   HLD  - LDL 215 - Continue statin - Refer to lipid clinic with numerous CV risk factors, needs aggressive lipid mgmt. Goal LDL <55%.    Referred to HFSW (PCP, Medications, Transportation, ETOH Abuse, Drug Abuse, Insurance, Financial ): No Refer to Pharmacy:  No Refer to Home Health:  No Refer to Advanced Heart Failure Clinic: No  Refer to General Cardiology: Patient of: Redell Shallow, MD   Follow up   [facility], Marilene Vath N, PA-C 4:01 PM  "

## 2024-10-14 ENCOUNTER — Telehealth (HOSPITAL_COMMUNITY): Payer: Self-pay

## 2024-10-14 ENCOUNTER — Other Ambulatory Visit: Payer: Self-pay

## 2024-10-14 DIAGNOSIS — Z79899 Other long term (current) drug therapy: Secondary | ICD-10-CM

## 2024-10-14 DIAGNOSIS — I5022 Chronic systolic (congestive) heart failure: Secondary | ICD-10-CM

## 2024-10-14 NOTE — Telephone Encounter (Signed)
 Pt husband called to speak with Hailey and schedule for cardiac rehab.  Advised to call back Monday

## 2024-10-14 NOTE — Telephone Encounter (Signed)
 Attempted to call patient to schedule cardiac rehab- no answer, left message. Sent MyChart message.

## 2024-10-14 NOTE — Telephone Encounter (Signed)
 Called to confirm/remind patient of their appointment at the [facility] on 10/17/14 11:15.   Appointment:   [] Confirmed  [x] Left mess   [] No answer/No voice mail  [] VM Full/unable to leave message  [] Phone not in service  Patient reminded to bring all medications and/or complete list.  Confirmed patient has transportation. Gave directions, instructed to utilize valet parking.

## 2024-10-14 NOTE — Telephone Encounter (Signed)
 Pt insurance is active and benefits verified through [employer]. Co-pay $0, DED $0/$0 met, out of pocket $3,900/$0 met, co-insurance 0%. No pre-authorization required. 10/14/2024 @ 2:07pm, REF# [id number].  TCR/ICR? ICR Visit(date of service)limitation? No Can multiple codes be used on the same date of service/visit?(IF ITS A LIMIT) N/A  Is this a lifetime maximum or an annual maximum? Annual Has the member used any of these services to date? No Is there a time limit (weeks/months) on start of program and/or program completion? No

## 2024-10-17 ENCOUNTER — Telehealth (HOSPITAL_COMMUNITY): Payer: Self-pay

## 2024-10-17 ENCOUNTER — Ambulatory Visit (HOSPITAL_COMMUNITY)

## 2024-10-17 NOTE — Telephone Encounter (Signed)
 Patient's son called to get patient scheduled in the Cardiac Rehab Program. Patient will come in for orientation on 1/22 and will attend the 12:30 exercise class.  Sent MyChart message.

## 2024-10-20 ENCOUNTER — Telehealth (HOSPITAL_COMMUNITY): Payer: Self-pay

## 2024-10-20 ENCOUNTER — Ambulatory Visit

## 2024-10-20 VITALS — BP 109/76 | HR 78 | Temp 97.6°F | Resp 14 | Ht 63.0 in | Wt 144.2 lb

## 2024-10-20 DIAGNOSIS — E7800 Pure hypercholesterolemia, unspecified: Secondary | ICD-10-CM

## 2024-10-20 DIAGNOSIS — E782 Mixed hyperlipidemia: Secondary | ICD-10-CM

## 2024-10-20 MED ORDER — INCLISIRAN SODIUM 284 MG/1.5ML ~~LOC~~ SOSY
284.0000 mg | PREFILLED_SYRINGE | Freq: Once | SUBCUTANEOUS | Status: AC
  Administered 2024-10-20: 284 mg via SUBCUTANEOUS
  Filled 2024-10-20: qty 1.5

## 2024-10-20 NOTE — Patient Instructions (Signed)
 Inclisiran Injection What is this medication? INCLISIRAN (in kli SIR an) treats high cholesterol. It works by decreasing bad cholesterol (such as LDL) in your blood. Changes to diet and exercise are often combined with this medication. This medicine may be used for other purposes; ask your health care provider or pharmacist if you have questions. COMMON BRAND NAME(S): LEQVIO What should I tell my care team before I take this medication? They need to know if you have any of these conditions: An unusual or allergic reaction to inclisiran, other medications, foods, dyes, or preservatives Pregnant or trying to get pregnant Breast-feeding How should I use this medication? This medication is injected under the skin. It is given by your care team in a hospital or clinic setting. Talk to your care team about the use of this medication in children. Special care may be needed. Overdosage: If you think you have taken too much of this medicine contact a poison control center or emergency room at once. NOTE: This medicine is only for you. Do not share this medicine with others. What if I miss a dose? Keep appointments for follow-up doses. It is important not to miss your dose. Call your care team if you are unable to keep an appointment. What may interact with this medication? Interactions are not expected. This list may not describe all possible interactions. Give your health care provider a list of all the medicines, herbs, non-prescription drugs, or dietary supplements you use. Also tell them if you smoke, drink alcohol, or use illegal drugs. Some items may interact with your medicine. What should I watch for while using this medication? Visit your care team for regular checks on your progress. Tell your care team if your symptoms do not start to get better or if they get worse. You may need blood work while you are taking this medication. What side effects may I notice from receiving this  medication? Side effects that you should report to your care team as soon as possible: Allergic reactions--skin rash, itching, hives, swelling of the face, lips, tongue, or throat Side effects that usually do not require medical attention (report these to your care team if they continue or are bothersome): Joint pain Pain, redness, or irritation at injection site This list may not describe all possible side effects. Call your doctor for medical advice about side effects. You may report side effects to FDA at 1-800-FDA-1088. Where should I keep my medication? This medication is given in a hospital or clinic. It will not be stored at home. NOTE: This sheet is a summary. It may not cover all possible information. If you have questions about this medicine, talk to your doctor, pharmacist, or health care provider.  2024 Elsevier/Gold Standard (2023-09-04 00:00:00)

## 2024-10-20 NOTE — Progress Notes (Signed)
 Diagnosis: Hyperlipidemia  Provider:  Chilton Greathouse MD  Procedure: Injection  Leqvio (inclisiran), Dose: 284 mg, Site: subcutaneous, Number of injections: 1  Injection Site(s): Left upper quad. abdomen  Post Care: Observation period completed  Discharge: Condition: Good, Destination: Home . AVS Provided  Performed by:  Adriana Mccallum, RN

## 2024-10-20 NOTE — Telephone Encounter (Signed)
 Called to confirm/remind patient of their appointment at the [facility] on 10/21/24 11:15.   Appointment:   [] Confirmed  [] Left mess   [x] No answer/No voice mail  [] VM Full/unable to leave message  [] Phone not in service  Patient reminded to bring all medications and/or complete list.  Confirmed patient has transportation. Gave directions, instructed to utilize valet parking.

## 2024-10-21 ENCOUNTER — Ambulatory Visit (HOSPITAL_COMMUNITY)
Admission: RE | Admit: 2024-10-21 | Discharge: 2024-10-21 | Disposition: A | Discharge status: Home / Self Care | Source: Ambulatory Visit | Attending: Physician Assistant | Admitting: Physician Assistant

## 2024-10-21 ENCOUNTER — Encounter (HOSPITAL_COMMUNITY): Payer: Self-pay

## 2024-10-21 VITALS — BP 150/88 | HR 87 | Wt 143.6 lb

## 2024-10-21 DIAGNOSIS — I2510 Atherosclerotic heart disease of native coronary artery without angina pectoris: Secondary | ICD-10-CM | POA: Insufficient documentation

## 2024-10-21 DIAGNOSIS — E782 Mixed hyperlipidemia: Secondary | ICD-10-CM

## 2024-10-21 DIAGNOSIS — Z8673 Personal history of transient ischemic attack (TIA), and cerebral infarction without residual deficits: Secondary | ICD-10-CM | POA: Insufficient documentation

## 2024-10-21 DIAGNOSIS — F32A Depression, unspecified: Secondary | ICD-10-CM | POA: Insufficient documentation

## 2024-10-21 DIAGNOSIS — I5022 Chronic systolic (congestive) heart failure: Secondary | ICD-10-CM | POA: Insufficient documentation

## 2024-10-21 DIAGNOSIS — Z79899 Other long term (current) drug therapy: Secondary | ICD-10-CM | POA: Insufficient documentation

## 2024-10-21 DIAGNOSIS — Z794 Long term (current) use of insulin: Secondary | ICD-10-CM | POA: Diagnosis not present

## 2024-10-21 DIAGNOSIS — E119 Type 2 diabetes mellitus without complications: Secondary | ICD-10-CM | POA: Insufficient documentation

## 2024-10-21 DIAGNOSIS — G4730 Sleep apnea, unspecified: Secondary | ICD-10-CM | POA: Diagnosis not present

## 2024-10-21 DIAGNOSIS — R569 Unspecified convulsions: Secondary | ICD-10-CM | POA: Insufficient documentation

## 2024-10-21 DIAGNOSIS — Z7982 Long term (current) use of aspirin: Secondary | ICD-10-CM | POA: Diagnosis not present

## 2024-10-21 DIAGNOSIS — I110 Hypertensive heart disease with heart failure: Secondary | ICD-10-CM | POA: Insufficient documentation

## 2024-10-21 DIAGNOSIS — E785 Hyperlipidemia, unspecified: Secondary | ICD-10-CM | POA: Insufficient documentation

## 2024-10-21 DIAGNOSIS — H81399 Other peripheral vertigo, unspecified ear: Secondary | ICD-10-CM | POA: Insufficient documentation

## 2024-10-21 DIAGNOSIS — E669 Obesity, unspecified: Secondary | ICD-10-CM | POA: Diagnosis not present

## 2024-10-21 DIAGNOSIS — I10 Essential (primary) hypertension: Secondary | ICD-10-CM

## 2024-10-21 DIAGNOSIS — I5020 Unspecified systolic (congestive) heart failure: Secondary | ICD-10-CM

## 2024-10-21 LAB — BASIC METABOLIC PANEL WITH GFR
Anion gap: 11 (ref 5–15)
BUN: 49 mg/dL — ABNORMAL HIGH (ref 8–23)
CO2: 23 mmol/L (ref 22–32)
Calcium: 9.8 mg/dL (ref 8.9–10.3)
Chloride: 98 mmol/L (ref 98–111)
Creatinine, Ser: 1.75 mg/dL — ABNORMAL HIGH (ref 0.44–1.00)
GFR, Estimated: 31 mL/min — ABNORMAL LOW
Glucose, Bld: 290 mg/dL — ABNORMAL HIGH (ref 70–99)
Potassium: 4.7 mmol/L (ref 3.5–5.1)
Sodium: 132 mmol/L — ABNORMAL LOW (ref 135–145)

## 2024-10-21 LAB — PRO BRAIN NATRIURETIC PEPTIDE: Pro Brain Natriuretic Peptide: 226 pg/mL

## 2024-10-21 MED ORDER — ATORVASTATIN CALCIUM 80 MG PO TABS: 80.0000 mg | ORAL_TABLET | Freq: Every day | ORAL | 3 refills | Status: AC

## 2024-10-21 MED ORDER — SACUBITRIL-VALSARTAN 49-51 MG PO TABS: 1.0000 | ORAL_TABLET | Freq: Two times a day (BID) | ORAL | 3 refills | Status: AC

## 2024-10-21 NOTE — Patient Instructions (Signed)
 Medication Changes:  RESTART ATORVASTATIN    STOP FUROSEMIDE    RESTART ENTRESTO  49/51MG  TWICE DAILY   Lab Work:  Labs done today, your results will be available in MyChart, we will contact you for abnormal readings.  THEN LABS AGAIN IN 1 WEEK AS SCHEDULED    Follow-Up in: 4 WEEKS WITH PHARMACY AS SCHEDULED   THEN AGAIN IN 8 WEEKS WITH AN ECHO AS SCHEDULED AND FOLLOW UP  At the [facility], you and your health needs are our priority. We have a designated team specialized in the treatment of Heart Failure. This Care Team includes your primary Heart Failure Specialized Cardiologist (physician), Advanced Practice Providers (APPs- Physician Assistants and Nurse Practitioners), and Pharmacist who all work together to provide you with the care you need, when you need it.   You may see any of the following providers on your designated Care Team at your next follow up:  Dr. Toribio Fuel Dr. Ezra Shuck Dr. Odis Brownie Greig Mosses, NP Caffie Shed, [place] [facility], [place] Beckey Coe, NP Jordan Lee, NP Tinnie Redman, PharmD   Please be sure to bring in all your medications bottles to every appointment.   Need to Contact Us :  If you have any questions or concerns before your next appointment please send us  a message through [place] or call our office at [phone].    TO LEAVE A MESSAGE FOR THE NURSE SELECT OPTION 2, PLEASE LEAVE A MESSAGE INCLUDING: YOUR NAME DATE OF BIRTH CALL BACK NUMBER REASON FOR CALL**this is important as we prioritize the call backs  YOU WILL RECEIVE A CALL BACK THE SAME DAY AS LONG AS YOU CALL BEFORE 4:00 PM

## 2024-10-21 NOTE — Progress Notes (Addendum)
 "    HEART & VASCULAR TRANSITION OF CARE NOTE     PCP: Yolande Toribio MATSU, MD  Cardiologist: Dr. Pietro HF Cardiologist: Previously Dr. Cherrie  HPI: Referred to clinic by Dr. Leotis for heart failure consultation.   Sally Hendrix is a 69 y.o. female with CAD, chronic HFrEF, HTN, DMII, obesity, seizures, sleep apnea and depression.   EF 10 % back in 2011, followed by Dr. Cherrie. Suspected HTN CM. EF improved to 60% '12 on GDMT. Graduated from AHF.   Readmitted 3/18 with EF back down to 40%. Ran out of her medications at that time and was readmitted with chest pain and SOB. LHC showed 80% stenosis in prox LAD, s/p DES. GDMT restarted. Followed by Dr. Calhoun. Echo post PCI showed EF up to 50-55%.   Admitted 11/25 with recurrent dizziness with falls. MRI showed small acute infarct in the L subinsular white matter, R corona radiator and R parietal lobe. 2.2 cm meningioma increased in size from 19'. Neurology was consulted. Carotid duplex mild stenosis on both sides 1 to 39%.  MRA  > No large vessel occlusion. Neurology recommended ASA/Plavix  for 21 days followed by ASA monotherapy. Echo showed EF 30-35%, LV with GHK, RV normal.   Here today for CHF follow-up. She is accompanied by her daughters who assist with the history. She ran out of entresto  after last visit and stopped taking it d/t concern for her kidney function. She had labs done at her PCP's office the first week of January, creatinine and BUN were elevated.  States she doesn't drink much. Reports some shortness of breath with climbing stairs or walking far distances. Looking forward to cardiac rehab, hasn't been as active since discharge from the hospital. No orthopnea, PND or lower extremity edema. Home weight stable at 141-142 lb. Occasionally lightheaded if stands too quickly. Also has peripheral vertigo.  Got her fist dose of inclisiran this week. She stopped taking atorvastatin  as she thought inclisiran was replacing the  medication.  No ETOH or tobacco use.   Past Medical History:  Diagnosis Date   Cervical cancer (HCC) 1980s   CHF (congestive heart failure) (HCC)    Depression    all my life (05/03/2018)   Glaucoma    History of kidney stones    Hypertension    Myocardial infarction (HCC) 12/10/2016   Nonischemic cardiomyopathy (HCC)    Obesity    Seizure (HCC) 05/02/2018 X2-3   Sleep apnea    not dx'd (05/03/2018)   Type II diabetes mellitus (HCC)     Current Outpatient Medications  Medication Sig Dispense Refill   acetaminophen  (TYLENOL ) 500 MG tablet Take 1,000 mg by mouth every 6 (six) hours as needed for mild pain (pain score 1-3) or headache.     aspirin  81 MG chewable tablet Chew 1 tablet (81 mg total) by mouth daily. 30 tablet 2   carvedilol  (COREG ) 6.25 MG tablet Take 1 tablet (6.25 mg total) by mouth 2 (two) times daily with a meal. 60 tablet 0   Dextromethorphan -buPROPion  ER (AUVELITY ) 45-105 MG TBCR Take 1 tablet by mouth in the morning.     Insulin  Isophane & Regular Human (NOVOLIN 70/30 FLEXPEN RELION) (70-30) 100 UNIT/ML PEN Inject 15 Units into the skin 2 (two) times daily before a meal. (Patient taking differently: Inject 12 Units into the skin 2 (two) times daily.) 15 mL 0   sacubitril -valsartan  (ENTRESTO ) 49-51 MG Take 1 tablet by mouth 2 (two) times daily. 60 tablet 3  spironolactone  (ALDACTONE ) 25 MG tablet Take 1 tablet (25 mg total) by mouth daily. 90 tablet 3   Turmeric (QC TUMERIC COMPLEX) 500 MG CAPS Take 1 capsule by mouth in the morning.     atorvastatin  (LIPITOR ) 80 MG tablet Take 1 tablet (80 mg total) by mouth daily. 90 tablet 3   No current facility-administered medications for this encounter.    No Known Allergies    Social History   Socioeconomic History   Marital status: Married    Spouse name: Blane   Number of children: 6   Years of education: Not on file   Highest education level: High school graduate  Occupational History   Occupation: Chief Executive Officer: [employer]   Occupation: retired  Tobacco Use   Smoking status: Former    Current packs/day: 0.00    Types: Cigarettes    Quit date: 1979    Years since quitting: 47.0   Smokeless tobacco: Never   Tobacco comments:    smoked alot for 1 yr  Vaping Use   Vaping status: Never Used  Substance and Sexual Activity   Alcohol  use: Not Currently    Comment: 05/03/2018 nothing in the last 10 yrs; take too much RX   Drug use: No   Sexual activity: Not on file  Other Topics Concern   Not on file  Social History Narrative   Not on file   Social Drivers of Health   Tobacco Use: Medium Risk (10/21/2024)   Patient History    Smoking Tobacco Use: Former    Smokeless Tobacco Use: Never    Passive Exposure: Not on Actuary Strain: Low Risk (08/26/2024)   Overall Financial Resource Strain (CARDIA)    Difficulty of Paying Living Expenses: Not hard at all  Food Insecurity: No Food Insecurity (08/23/2024)   Epic    Worried About Programme Researcher, Broadcasting/film/video in the Last Year: Never true    Ran Out of Food in the Last Year: Never true  Transportation Needs: No Transportation Needs (08/26/2024)   Epic    Lack of Transportation (Medical): No    Lack of Transportation (Non-Medical): No  Physical Activity: Not on file  Stress: Not on file  Social Connections: Moderately Isolated (08/23/2024)   Social Connection and Isolation Panel    Frequency of Communication with Friends and Family: More than three times a week    Frequency of Social Gatherings with Friends and Family: More than three times a week    Attends Religious Services: Never    Database Administrator or Organizations: No    Attends Banker Meetings: Never    Marital Status: Married  Catering Manager Violence: Not At Risk (08/23/2024)   Epic    Fear of Current or Ex-Partner: No    Emotionally Abused: No    Physically Abused: No    Sexually Abused: No  Depression  (PHQ2-9): Not on file  Alcohol  Screen: Low Risk (08/26/2024)   Alcohol  Screen    Last Alcohol  Screening Score (AUDIT): 0  Housing: Unknown (08/26/2024)   Epic    Unable to Pay for Housing in the Last Year: No    Number of Times Moved in the Last Year: Not on file    Homeless in the Last Year: No  Utilities: Not At Risk (08/23/2024)   Epic    Threatened with loss of utilities: No  Health Literacy: Not on file  Family History  Problem Relation Age of Onset   Heart failure Mother    Cirrhosis Mother        hx of - died of nonalcoholic cirrhosis   Coronary artery disease Father 46   Heart attack Father    Depression Father    Other Brother        brain tumor   Heart attack Maternal Grandfather    Colon cancer Paternal Aunt    Pancreatic cancer Cousin    Hyperlipidemia Son     Vitals:   10/21/24 1138  BP: (!) 150/88  Pulse: 87  SpO2: 98%  Weight: 65.1 kg (143 lb 9.6 oz)    PHYSICAL EXAM: General:  Well appearing. Ambulated into clinic. Neck: no JVD.  Cor: Regular rate & rhythm. No murmurs. Lungs: clear Abdomen: soft, nontender, nondistended. Extremities: no edema Neuro: alert & orientedx3. Affect pleasant  ECG: none today  Labs at OSH 10/11/24: Scr 1.9, BUN 40, Na 135, K 4.9  ASSESSMENT & PLAN:  Chronic HFrEF - Echo 11/25 EF 30-35%, LV with GHK, RV normal.  - 30 day Cardiac monitor 12/25: SR with PVCs and PACs, no Afib - NYHA II - Volume stable on exam. Stop lasix , use PRN. Suspect Scr recently higher d/t volume depletion.  - Continue coreg  6.25 mg BID - Restart entresto  49/51 mg BID (watch for hypotension, gave parameters for when to call) - Continue spiro 25 mg daily - No SGLT2i at this time with A1c 11.1. Plan to start once A1c <10.  - BMET/proBNP today, BMET 1 week - Plan for repeat echo in ~2-3 months on good GDMT and controlled BP. If EF has not improved then can reconsider ischemic eval.  - Starting cardiac rehab soon  HTN - longstanding -  BP elevated, hasn't had meds yet and recently off entresto . Changes as above.  CAD  - LHC 09/2010 with normal coronaries  - LHC 2018 with severe 1v CAD w/ thrombotic plaque rupture in the prox LAD treated with DES - Continue statin, ASA - No angina - Plan as above, if EF does not improve can consider repeating ischemic eval.   HLD  - LDL 215 - Restart statin therapy - Recently started inclisiran - Goal LDL <55%.   CVA 11/25 - L subsinular, right corona radiata and R parietal lobe infarcts - Seen by Neuro - no interatrial shunt on echo w/ bubble study - No afib on 30 day monitor - No large vessel occlusion - Suspected small vessel disease w/ uncontrolled risk factors - On aspirin  + statin, completed 21 days of plavix   Follow up 4 weeks with PharmD for medication titration, 8 weeks with APP with echo. Will follow along with Dr. Pietro, has appointment in February. If LV function recovers can graduate from Advanced Heart Failure.  Ramey Ketcherside N, PA-C 12:13 PM  "

## 2024-10-24 ENCOUNTER — Ambulatory Visit (HOSPITAL_COMMUNITY): Payer: Self-pay | Admitting: Physician Assistant

## 2024-10-24 DIAGNOSIS — I5020 Unspecified systolic (congestive) heart failure: Secondary | ICD-10-CM

## 2024-10-25 ENCOUNTER — Encounter: Payer: Self-pay | Admitting: Physician Assistant

## 2024-10-25 ENCOUNTER — Telehealth (HOSPITAL_COMMUNITY): Payer: Self-pay

## 2024-10-25 NOTE — Telephone Encounter (Signed)
 Attempted to confirm cardiac rehab orientation date of 10/27/24 @ 0800.  Location, what to bring, footwear, and approximate length of orientation given in message.

## 2024-10-27 ENCOUNTER — Telehealth (HOSPITAL_COMMUNITY): Payer: Self-pay

## 2024-10-27 ENCOUNTER — Encounter (HOSPITAL_COMMUNITY)
Admission: RE | Admit: 2024-10-27 | Discharge: 2024-10-27 | Disposition: A | Discharge status: Home / Self Care | Source: Ambulatory Visit | Attending: Internal Medicine | Admitting: Internal Medicine

## 2024-10-27 VITALS — BP 132/74 | HR 93

## 2024-10-27 DIAGNOSIS — I5022 Chronic systolic (congestive) heart failure: Secondary | ICD-10-CM | POA: Insufficient documentation

## 2024-10-27 LAB — GLUCOSE, CAPILLARY
Glucose-Capillary: 311 mg/dL — ABNORMAL HIGH (ref 70–99)
Glucose-Capillary: 329 mg/dL — ABNORMAL HIGH (ref 70–99)

## 2024-10-27 NOTE — Telephone Encounter (Signed)
 Upon further review, it would be best to schedule this patient to finish her walk test on Thursday 1/29 at either 10:30 or 11:00am.

## 2024-10-27 NOTE — Telephone Encounter (Signed)
 Attempted to reschedule walk test- no answer, left message. If patient calls back, we are able to offer her 10:30 or 11:00 on Tuesday 1/27 to complete her walk test.

## 2024-10-27 NOTE — Progress Notes (Signed)
 Incomplete Session Note  Patient Details  Name: Sally Hendrix MRN: [id number] Date of Birth: 04-08-1956 Referring Provider:   Flowsheet Row INTENSIVE CARDIAC REHAB ORIENT from 10/27/2024 in [facility]  Referring Provider Dr. Toribio Fuel MD    Sally Hendrix did not complete her rehab session.  Pt was unable to complete her cardiac rehab phase 2 orientation today due to elevated blood sugars. Per protocol, blood glucose must be less than 300. Her initial blood glucose was 311, after about 20 minutes and 16oz of water, the blood glucose was 329. Advised patient that her blood sugars must be below 300 for her to participate in the CRP2 program. Pt verbalized understanding. Pt to see Dr. Yolande next week.   Alm Parkins MS, ACSM-CEP, CCRP

## 2024-10-27 NOTE — Progress Notes (Signed)
 Cardiac Rehab Medication Review   Does the patient  feel that his/her medications are working for him/her?  yes  Has the patient been experiencing any side effects to the medications prescribed?  no  Does the patient measure his/her own blood pressure or blood glucose at home?  yes   Does the patient have any problems obtaining medications due to transportation or finances?   no  Understanding of regimen: good Understanding of indications: good Potential of compliance: good    Comments: No questions regarding medications. Checks blood sugar and blood pressure at home.    Sally Hendrix 10/27/2024 8:36 AM

## 2024-10-28 ENCOUNTER — Ambulatory Visit (HOSPITAL_COMMUNITY)
Admission: RE | Admit: 2024-10-28 | Discharge: 2024-10-28 | Disposition: A | Source: Ambulatory Visit | Attending: Cardiology

## 2024-10-28 DIAGNOSIS — I5020 Unspecified systolic (congestive) heart failure: Secondary | ICD-10-CM | POA: Insufficient documentation

## 2024-10-28 LAB — BASIC METABOLIC PANEL WITH GFR
Anion gap: 10 (ref 5–15)
BUN: 42 mg/dL — ABNORMAL HIGH (ref 8–23)
CO2: 24 mmol/L (ref 22–32)
Calcium: 9.8 mg/dL (ref 8.9–10.3)
Chloride: 101 mmol/L (ref 98–111)
Creatinine, Ser: 1.65 mg/dL — ABNORMAL HIGH (ref 0.44–1.00)
GFR, Estimated: 33 mL/min — ABNORMAL LOW
Glucose, Bld: 354 mg/dL — ABNORMAL HIGH (ref 70–99)
Potassium: 5.4 mmol/L — ABNORMAL HIGH (ref 3.5–5.1)
Sodium: 135 mmol/L (ref 135–145)

## 2024-10-31 ENCOUNTER — Encounter (HOSPITAL_COMMUNITY)

## 2024-11-01 ENCOUNTER — Encounter (HOSPITAL_COMMUNITY): Payer: Self-pay

## 2024-11-01 ENCOUNTER — Telehealth (HOSPITAL_COMMUNITY): Payer: Self-pay

## 2024-11-01 DIAGNOSIS — I5022 Chronic systolic (congestive) heart failure: Secondary | ICD-10-CM

## 2024-11-01 NOTE — Telephone Encounter (Signed)
 Returned call to pt for rescheduling C/R orientation 6-minute walk test. Spoke with son and informed him that she needed to complete f/u appointment with Dr. Yolande before rescheduling the orientation.   Alm Parkins MS, ACSM-CEP, CCRP

## 2024-11-01 NOTE — Progress Notes (Signed)
 "    HPI: Follow-up coronary artery disease and congestive heart failure.  Previously followed by Dr. Alveta and also in the CHF clinic but transitioning to me.  Cardiac catheterization 2018 showed ejection fraction 35 to 45%, 80% proximal LAD, 80% first diagonal.  Patient had PCI of the proximal LAD.  Procedure complicated by jailing/loss of the diagonal but was felt to be a small vessel.  Admitted November 2025 with complaints of dizziness and nausea.  Carotid Dopplers November 2025 showed 1 to 39% bilateral stenosis.  Echocardiogram November 2025 showed ejection fraction 30 to 35%.  Brain MRI November 2025 showed small acute infarcts in the left subinsular white matter, right corona radiata and right parietal lobe, 2.2 cm meningioma.  MRA November 2025 showed no large vessel occlusion, moderate bilateral proximal P2 PCA stenosis.  Monitor January 2026 showed sinus rhythm with PAC/PVC but no atrial fibrillation noted.  Since last seen she denies dyspnea, chest pain, palpitations or syncope.  Current Outpatient Medications  Medication Sig Dispense Refill   acetaminophen  (TYLENOL ) 500 MG tablet Take 1,000 mg by mouth every 6 (six) hours as needed for mild pain (pain score 1-3) or headache.     aspirin  81 MG chewable tablet Chew 1 tablet (81 mg total) by mouth daily. 30 tablet 2   atorvastatin  (LIPITOR ) 80 MG tablet Take 1 tablet (80 mg total) by mouth daily. 90 tablet 3   carvedilol  (COREG ) 6.25 MG tablet Take 1 tablet (6.25 mg total) by mouth 2 (two) times daily with a meal. 60 tablet 0   Dextromethorphan -buPROPion  ER (AUVELITY ) 45-105 MG TBCR Take 1 tablet by mouth in the morning.     Insulin  Isophane & Regular Human (NOVOLIN 70/30 FLEXPEN RELION) (70-30) 100 UNIT/ML PEN Inject 15 Units into the skin 2 (two) times daily before a meal. (Patient taking differently: Inject 4 Units into the skin 4 (four) times daily.) 15 mL 0   sacubitril -valsartan  (ENTRESTO ) 49-51 MG Take 1 tablet by mouth 2 (two) times  daily. 60 tablet 3   Turmeric (QC TUMERIC COMPLEX) 500 MG CAPS Take 1 capsule by mouth in the morning.     spironolactone  (ALDACTONE ) 25 MG tablet Take 1 tablet (25 mg total) by mouth daily. (Patient not taking: Reported on 11/10/2024) 90 tablet 3   No current facility-administered medications for this visit.     Past Medical History:  Diagnosis Date   Cervical cancer (HCC) 1980s   CHF (congestive heart failure) (HCC)    Depression    all my life (05/03/2018)   Glaucoma    History of kidney stones    Hypertension    Myocardial infarction (HCC) 12/10/2016   Nonischemic cardiomyopathy (HCC)    Obesity    Seizure (HCC) 05/02/2018 X2-3   Sleep apnea    not dx'd (05/03/2018)   Type II diabetes mellitus (HCC)     Past Surgical History:  Procedure Laterality Date   CARDIAC CATHETERIZATION     CATARACT EXTRACTION Bilateral    CERVIX LESION DESTRUCTION  1980s   for cancer   CORONARY STENT INTERVENTION N/A 12/10/2016   Procedure: Coronary Stent Intervention;  Surgeon: Deatrice DELENA Cage, MD;  Location: [facility] INVASIVE CV [facility];  Service: Cardiovascular;  Laterality: N/A;   LEFT HEART CATH AND CORONARY ANGIOGRAPHY N/A 12/10/2016   Procedure: Left Heart Cath and Coronary Angiography;  Surgeon: Deatrice DELENA Cage, MD;  Location: [facility]VAS[facility] CV [facility];  Service: Cardiovascular;  Laterality: N/A;   PARS PLANA VITRECTOMY Left 08/23/2018   Procedure:  PARS PLANA VITRECTOMY 25 GAUGE FOR ENDOPHTHALMITIS;  Surgeon: Elner Arley LABOR, MD;  Location: [facility] OR;  Service: Ophthalmology;  Laterality: Left;   PARS PLANA VITRECTOMY Left 08/26/2018   Procedure: PARS PLANA VITRECTOMY 25 GAUGE FOR ENDOPHTHALMITIS, diagnostic vitreous tap;  Surgeon: Elner Arley LABOR, MD;  Location: [facility];  Service: Ophthalmology;  Laterality: Left;    Social History   Socioeconomic History   Marital status: Married    Spouse name: Blane   Number of children: 6   Years of education: Not on file   Highest education level: High school  graduate  Occupational History   Occupation: Education Officer, Museum: [employer]   Occupation: retired  Tobacco Use   Smoking status: Former    Current packs/day: 0.00    Types: Cigarettes    Quit date: 1979    Years since quitting: 47.1   Smokeless tobacco: Never   Tobacco comments:    smoked alot for 1 yr  Vaping Use   Vaping status: Never Used  Substance and Sexual Activity   Alcohol  use: Not Currently    Comment: 05/03/2018 nothing in the last 10 yrs; take too much RX   Drug use: No   Sexual activity: Not on file  Other Topics Concern   Not on file  Social History Narrative   Not on file   Social Drivers of Health   Tobacco Use: Medium Risk (11/10/2024)   Patient History    Smoking Tobacco Use: Former    Smokeless Tobacco Use: Never    Passive Exposure: Not on file  Financial Resource Strain: Low Risk (08/26/2024)   Overall Financial Resource Strain (CARDIA)    Difficulty of Paying Living Expenses: Not hard at all  Food Insecurity: No Food Insecurity (08/23/2024)   Epic    Worried About Programme Researcher, Broadcasting/film/video in the Last Year: Never true    Ran Out of Food in the Last Year: Never true  Transportation Needs: No Transportation Needs (08/26/2024)   Epic    Lack of Transportation (Medical): No    Lack of Transportation (Non-Medical): No  Physical Activity: Not on file  Stress: Not on file  Social Connections: Moderately Isolated (08/23/2024)   Social Connection and Isolation Panel    Frequency of Communication with Friends and Family: More than three times a week    Frequency of Social Gatherings with Friends and Family: More than three times a week    Attends Religious Services: Never    Database Administrator or Organizations: No    Attends Banker Meetings: Never    Marital Status: Married  Catering Manager Violence: Not At Risk (08/23/2024)   Epic    Fear of Current or Ex-Partner: No    Emotionally Abused: No    Physically  Abused: No    Sexually Abused: No  Depression (PHQ2-9): Medium Risk (10/27/2024)   Depression (PHQ2-9)    PHQ-2 Score: 7  Alcohol  Screen: Low Risk (08/26/2024)   Alcohol  Screen    Last Alcohol  Screening Score (AUDIT): 0  Housing: Unknown (08/26/2024)   Epic    Unable to Pay for Housing in the Last Year: No    Number of Times Moved in the Last Year: Not on file    Homeless in the Last Year: No  Utilities: Not At Risk (08/23/2024)   Epic    Threatened with loss of utilities: No  Health Literacy: Not on file    Family History  Problem Relation Age of Onset   Heart failure Mother    Cirrhosis Mother        hx of - died of nonalcoholic cirrhosis   Coronary artery disease Father 32   Heart attack Father    Depression Father    Other Brother        brain tumor   Heart attack Maternal Grandfather    Colon cancer Paternal Aunt    Pancreatic cancer Cousin    Hyperlipidemia Son     ROS: Back pain but no fevers or chills, productive cough, hemoptysis, dysphasia, odynophagia, melena, hematochezia, dysuria, hematuria, rash, seizure activity, orthopnea, PND, pedal edema, claudication. Remaining systems are negative.  Physical Exam: Well-developed well-nourished in no acute distress.  Skin is warm and dry.  HEENT is normal.  Neck is supple.  Chest is clear to auscultation with normal expansion.  Cardiovascular exam is regular rate and rhythm.  Abdominal exam nontender or distended. No masses palpated. Extremities show no edema. neuro grossly intact   A/P  1 chronic systolic congestive heart failure-plan to continue Entresto  and carvedilol  (increased to 12.5 mg twice daily).  Add Jardiance  10 mg daily.  Check potassium and renal function in 1 week.  Spironolactone  avoided due to renal insufficiency.  Repeat echocardiogram in 3 months.  2 history of coronary artery disease-patient has had no chest pain.  Continue Lipitor .  Goal LDL less than 55.  Continue aspirin .  3  hypertension-blood pressure is elevated but she states typically in the 130s at home.  Increase carvedilol  as outlined above and follow.  4 hyperlipidemia-continue Lipitor .  5 history of acute ischemic stroke-continue aspirin  and statin.  Followed by neurology.  No atrial fibrillation noted on monitor.  Redell Shallow, MD    "

## 2024-11-01 NOTE — Progress Notes (Signed)
 Cardiac Individual Treatment Plan  Patient Details  Name: Sally Hendrix MRN: [id number] Date of Birth: 1956/06/17 Referring Provider:   Flowsheet Row INTENSIVE CARDIAC REHAB ORIENT from 10/27/2024 in [facility]  Referring Provider Dr. Toribio Fuel MD    Initial Encounter Date:  Flowsheet Row INTENSIVE CARDIAC REHAB ORIENT from 10/27/2024 in [facility]  Date 10/27/24    Visit Diagnosis: Heart failure, chronic systolic (HCC)  Patient's Home Medications on Admission: Current Medications[1]  Past Medical History: Past Medical History:  Diagnosis Date   Cervical cancer (HCC) 1980s   CHF (congestive heart failure) (HCC)    Depression    all my life (05/03/2018)   Glaucoma    History of kidney stones    Hypertension    Myocardial infarction (HCC) 12/10/2016   Nonischemic cardiomyopathy (HCC)    Obesity    Seizure (HCC) 05/02/2018 X2-3   Sleep apnea    not dx'd (05/03/2018)   Type II diabetes mellitus (HCC)     Tobacco Use: Tobacco Use History[2]  Labs: Review Flowsheet  More data exists      Latest Ref Rng & Units 02/11/2018 05/03/2018 05/05/2018 08/23/2018 08/22/2024  Labs for ITP Cardiac and Pulmonary Rehab  Cholestrol 0 - 200 mg/dL - - 775  - 677   LDL (calc) 0 - 99 mg/dL - - 873  - 784   HDL-C >40 mg/dL - - 31  - 37   Trlycerides <150 mg/dL - - 662  - 650   Hemoglobin A1c 4.8 - 5.6 % - - 12.9  7.5  11.1   TCO2 22 - 32 mmol/L 26  27  - - -    Capillary Blood Glucose: Lab Results  Component Value Date   GLUCAP 329 (H) 10/27/2024   GLUCAP 311 (H) 10/27/2024   GLUCAP 321 (H) 08/26/2024   GLUCAP 276 (H) 08/26/2024   GLUCAP 265 (H) 08/26/2024     Exercise Target Goals: Exercise Program Goal: Individual exercise prescription set using results from initial 6 min walk test and THRR while considering  patients activity barriers and safety.    Exercise Prescription Goal: Initial exercise prescription builds to 30-45 minutes a day of aerobic activity, 2-3 days per week.  Home exercise guidelines will be given to patient during program as part of exercise prescription that the participant will acknowledge.  Activity Barriers & Risk Stratification:  Activity Barriers & Cardiac Risk Stratification - 10/27/24 0908       Activity Barriers & Cardiac Risk Stratification   Activity Barriers Balance Concerns;Joint Problems;Back Problems;Deconditioning;Muscular Weakness;Decreased Ventricular Function;Other (comment)    Comments Chronic low back pain, Neuropathy in legs and feet    Cardiac Risk Stratification High          6 Minute Walk:   Oxygen Initial Assessment:   Oxygen Re-Evaluation:   Oxygen Discharge (Final Oxygen Re-Evaluation):   Initial Exercise Prescription:  Initial Exercise Prescription - 10/27/24 0700       Date of Initial Exercise RX and Referring Provider   Date 10/27/24    Referring Provider Dr. Daniel Bensimhon MD    Expected Discharge Date 01/18/25      Prescription Details   Frequency (times per week) 3    Duration Progress to 30 minutes of continuous aerobic without signs/symptoms of physical distress      Intensity   THRR 40-80% of Max Heartrate 60-121  Ratings of Perceived Exertion 11-13    Perceived Dyspnea 0-4      Progression   Progression Continue to progress workloads to maintain intensity without signs/symptoms of physical distress.      Resistance Training   Training Prescription Yes    Reps 10-15          Perform Capillary Blood Glucose checks as needed.  Exercise Prescription Changes:   Exercise Comments:   Exercise Goals and Review:   Exercise Goals     Row Name 10/27/24 0755             Exercise Goals   Increase Physical Activity Yes       Intervention Provide advice, education, support and counseling about physical activity/exercise needs.;Develop an  individualized exercise prescription for aerobic and resistive training based on initial evaluation findings, risk stratification, comorbidities and participant's personal goals.       Expected Outcomes Short Term: Attend rehab on a regular basis to increase amount of physical activity.;Long Term: Exercising regularly at least 3-5 days a week.;Long Term: Add in home exercise to make exercise part of routine and to increase amount of physical activity.       Increase Strength and Stamina Yes       Intervention Provide advice, education, support and counseling about physical activity/exercise needs.;Develop an individualized exercise prescription for aerobic and resistive training based on initial evaluation findings, risk stratification, comorbidities and participant's personal goals.       Expected Outcomes Short Term: Increase workloads from initial exercise prescription for resistance, speed, and METs.;Short Term: Perform resistance training exercises routinely during rehab and add in resistance training at home;Long Term: Improve cardiorespiratory fitness, muscular endurance and strength as measured by increased METs and functional capacity ( )       Able to understand and use rate of perceived exertion (RPE) scale Yes       Intervention Provide education and explanation on how to use RPE scale       Expected Outcomes Short Term: Able to use RPE daily in rehab to express subjective intensity level;Long Term:  Able to use RPE to guide intensity level when exercising independently       Knowledge and understanding of Target Heart Rate Range (THRR) Yes       Intervention Provide education and explanation of THRR including how the numbers were predicted and where they are located for reference       Expected Outcomes Short Term: Able to state/look up THRR;Long Term: Able to use THRR to govern intensity when exercising independently;Short Term: Able to use daily as guideline for intensity in rehab        Understanding of Exercise Prescription Yes       Intervention Provide education, explanation, and written materials on patient's individual exercise prescription       Expected Outcomes Short Term: Able to explain program exercise prescription;Long Term: Able to explain home exercise prescription to exercise independently          Exercise Goals Re-Evaluation :   Discharge Exercise Prescription (Final Exercise Prescription Changes):   Nutrition:  Target Goals: Understanding of nutrition guidelines, daily intake of sodium 1500mg , cholesterol 200mg , calories 30% from fat and 7% or less from saturated fats, daily to have 5 or more servings of fruits and vegetables.  Biometrics:    Nutrition Therapy Plan and Nutrition Goals:   Nutrition Assessments:  MEDIFICTS Score Key: >=70 Need to make dietary changes  40-70 Heart Healthy Diet <= 40 Therapeutic  Level Cholesterol Diet    Picture Your Plate Scores: <59 Unhealthy dietary pattern with much room for improvement. 41-50 Dietary pattern unlikely to meet recommendations for good health and room for improvement. 51-60 More healthful dietary pattern, with some room for improvement.  >60 Healthy dietary pattern, although there may be some specific behaviors that could be improved.    Nutrition Goals Re-Evaluation:   Nutrition Goals Re-Evaluation:   Nutrition Goals Discharge (Final Nutrition Goals Re-Evaluation):   Psychosocial: Target Goals: Acknowledge presence or absence of significant depression and/or stress, maximize coping skills, provide positive support system. Participant is able to verbalize types and ability to use techniques and skills needed for reducing stress and depression.  Initial Review & Psychosocial Screening:  Initial Psych Review & Screening - 10/27/24 0909       Initial Review   Current issues with Current Depression;History of Depression;Current Anxiety/Panic;Current Psychotropic Meds;Current Sleep  Concerns;Current Stress Concerns    Source of Stress Concerns Family    Comments PHQ9 score is 9. Pt takes medication and does not want counseling. Pt has good family support with her husband and adult children. Pt voices a recnet panic attack: states I haven't had one in a very long time. Pt has adult son and grandchilden living with them which can be a source of stress.      Family Dynamics   Good Support System? Yes   Has spouse and adult children     Barriers   Psychosocial barriers to participate in program The patient should benefit from training in stress management and relaxation.      Screening Interventions   Interventions Encouraged to exercise    Expected Outcomes Short Term goal: Utilizing psychosocial counselor, staff and physician to assist with identification of specific Stressors or current issues interfering with healing process. Setting desired goal for each stressor or current issue identified.;Long Term Goal: Stressors or current issues are controlled or eliminated.;Short Term goal: Identification and review with participant of any Quality of Life or Depression concerns found by scoring the questionnaire.;Long Term goal: The participant improves quality of Life and PHQ9 Scores as seen by post scores and/or verbalization of changes          Quality of Life Scores:  Quality of Life - 10/27/24 1618       Quality of Life   Select Quality of Life      Quality of Life Scores   Health/Function Pre 19.67 %    Socioeconomic Pre 27.14 %    Psych/Spiritual Pre 23.07 %    Family Pre 30 %    GLOBAL Pre 23.43 %         Scores of 19 and below usually indicate a poorer quality of life in these areas.  A difference of  2-3 points is a clinically meaningful difference.  A difference of 2-3 points in the total score of the Quality of Life Index has been associated with significant improvement in overall quality of life, self-image, physical symptoms, and general health in  studies assessing change in quality of life.  PHQ-9: Review Flowsheet       10/27/2024 05/31/2020  Depression screen PHQ 2/9  Decreased Interest 1 1  Down, Depressed, Hopeless 1 0  PHQ - 2 Score 2 1  Altered sleeping 1 -  Tired, decreased energy 1 -  Change in appetite 1 -  Feeling bad or failure about yourself  0 -  Trouble concentrating 1 -  Moving slowly or fidgety/restless 1 -  Suicidal thoughts 0 -  PHQ-9 Score 7 -  Difficult doing work/chores Not difficult at all -   Interpretation of Total Score  Total Score Depression Severity:  1-4 = Minimal depression, 5-9 = Mild depression, 10-14 = Moderate depression, 15-19 = Moderately severe depression, 20-27 = Severe depression   Psychosocial Evaluation and Intervention:   Psychosocial Re-Evaluation:  Psychosocial Re-Evaluation     Row Name 11/01/24 1714             Psychosocial Re-Evaluation   Current issues with Current Depression;History of Depression;Current Anxiety/Panic;Current Psychotropic Meds;Current Sleep Concerns;Current Stress Concerns       Comments Sally Hendrix has not voiced any additional psychosocial concerns or stressors since orientation as she has yet to complete her first exercise class d/t inclement weather.       Expected Outcomes Sally Hendrix will voice controlled or decreased stressors/concerns by completion of cardiac rehab.       Interventions Encouraged to attend Cardiac Rehabilitation for the exercise;Relaxation education;Stress management education       Continue Psychosocial Services  Follow up required by staff          Psychosocial Discharge (Final Psychosocial Re-Evaluation):  Psychosocial Re-Evaluation - 11/01/24 1714       Psychosocial Re-Evaluation   Current issues with Current Depression;History of Depression;Current Anxiety/Panic;Current Psychotropic Meds;Current Sleep Concerns;Current Stress Concerns    Comments Sally Hendrix has not voiced any additional psychosocial concerns or stressors since  orientation as she has yet to complete her first exercise class d/t inclement weather.    Expected Outcomes Sally Hendrix will voice controlled or decreased stressors/concerns by completion of cardiac rehab.    Interventions Encouraged to attend Cardiac Rehabilitation for the exercise;Relaxation education;Stress management education    Continue Psychosocial Services  Follow up required by staff          Vocational Rehabilitation: Provide vocational rehab assistance to qualifying candidates.   Vocational Rehab Evaluation & Intervention:  Vocational Rehab - 10/27/24 0913       Initial Vocational Rehab Evaluation & Intervention   Assessment shows need for Vocational Rehabilitation No   Pt is retired         Education: Education Goals: Education classes will be provided on a weekly basis, covering required topics. Participant will state understanding/return demonstration of topics presented.     Core Videos: Exercise    Move It!  Clinical staff conducted group or individual video education with verbal and written material and guidebook.  Patient learns the recommended Pritikin exercise program. Exercise with the goal of living a long, healthy life. Some of the health benefits of exercise include controlled diabetes, healthier blood pressure levels, improved cholesterol levels, improved heart and lung capacity, improved sleep, and better body composition. Everyone should speak with their doctor before starting or changing an exercise routine.  Biomechanical Limitations Clinical staff conducted group or individual video education with verbal and written material and guidebook.  Patient learns how biomechanical limitations can impact exercise and how we can mitigate and possibly overcome limitations to have an impactful and balanced exercise routine.  Body Composition Clinical staff conducted group or individual video education with verbal and written material and guidebook.  Patient learns  that body composition (ratio of muscle mass to fat mass) is a key component to assessing overall fitness, rather than body weight alone. Increased fat mass, especially visceral belly fat, can put us  at increased risk for metabolic syndrome, type 2 diabetes, heart disease, and even death. It is recommended to combine diet and  exercise (cardiovascular and resistance training) to improve your body composition. Seek guidance from your physician and exercise physiologist before implementing an exercise routine.  Exercise Action Plan Clinical staff conducted group or individual video education with verbal and written material and guidebook.  Patient learns the recommended strategies to achieve and enjoy long-term exercise adherence, including variety, self-motivation, self-efficacy, and positive decision making. Benefits of exercise include fitness, good health, weight management, more energy, better sleep, less stress, and overall well-being.  Medical   Heart Disease Risk Reduction Clinical staff conducted group or individual video education with verbal and written material and guidebook.  Patient learns our heart is our most vital organ as it circulates oxygen, nutrients, white blood cells, and hormones throughout the entire body, and carries waste away. Data supports a plant-based eating plan like the Pritikin Program for its effectiveness in slowing progression of and reversing heart disease. The video provides a number of recommendations to address heart disease.   Metabolic Syndrome and Belly Fat  Clinical staff conducted group or individual video education with verbal and written material and guidebook.  Patient learns what metabolic syndrome is, how it leads to heart disease, and how one can reverse it and keep it from coming back. You have metabolic syndrome if you have 3 of the following 5 criteria: abdominal obesity, high blood pressure, high triglycerides, low HDL cholesterol, and high blood  sugar.  Hypertension and Heart Disease Clinical staff conducted group or individual video education with verbal and written material and guidebook.  Patient learns that high blood pressure, or hypertension, is very common in the [place] . Hypertension is largely due to excessive salt intake, but other important risk factors include being overweight, physical inactivity, drinking too much alcohol , smoking, and not eating enough potassium from fruits and vegetables. High blood pressure is a leading risk factor for heart attack, stroke, congestive heart failure, dementia, kidney failure, and premature death. Long-term effects of excessive salt intake include stiffening of the arteries and thickening of heart muscle and organ damage. Recommendations include ways to reduce hypertension and the risk of heart disease.  Diseases of Our Time - Focusing on Diabetes Clinical staff conducted group or individual video education with verbal and written material and guidebook.  Patient learns why the best way to stop diseases of our time is prevention, through food and other lifestyle changes. Medicine (such as prescription pills and surgeries) is often only a Band-Aid on the problem, not a long-term solution. Most common diseases of our time include obesity, type 2 diabetes, hypertension, heart disease, and cancer. The Pritikin Program is recommended and has been proven to help reduce, reverse, and/or prevent the damaging effects of metabolic syndrome.  Nutrition   Overview of the Pritikin Eating Plan  Clinical staff conducted group or individual video education with verbal and written material and guidebook.  Patient learns about the Pritikin Eating Plan for disease risk reduction. The Pritikin Eating Plan emphasizes a wide variety of unrefined, minimally-processed carbohydrates, like fruits, vegetables, whole grains, and legumes. Go, Caution, and Stop food choices are explained. Plant-based and lean animal  proteins are emphasized. Rationale provided for low sodium intake for blood pressure control, low added sugars for blood sugar stabilization, and low added fats and oils for coronary artery disease risk reduction and weight management.  Calorie Density  Clinical staff conducted group or individual video education with verbal and written material and guidebook.  Patient learns about calorie density and how it impacts the Pritikin Eating Plan. Knowing  the characteristics of the food you choose will help you decide whether those foods will lead to weight gain or weight loss, and whether you want to consume more or less of them. Weight loss is usually a side effect of the Pritikin Eating Plan because of its focus on low calorie-dense foods.  Label Reading  Clinical staff conducted group or individual video education with verbal and written material and guidebook.  Patient learns about the Pritikin recommended label reading guidelines and corresponding recommendations regarding calorie density, added sugars, sodium content, and whole grains.  Dining Out - Part 1  Clinical staff conducted group or individual video education with verbal and written material and guidebook.  Patient learns that restaurant meals can be sabotaging because they can be so high in calories, fat, sodium, and/or sugar. Patient learns recommended strategies on how to positively address this and avoid unhealthy pitfalls.  Facts on Fats  Clinical staff conducted group or individual video education with verbal and written material and guidebook.  Patient learns that lifestyle modifications can be just as effective, if not more so, as many medications for lowering your risk of heart disease. A Pritikin lifestyle can help to reduce your risk of inflammation and atherosclerosis (cholesterol build-up, or plaque, in the artery walls). Lifestyle interventions such as dietary choices and physical activity address the cause of atherosclerosis.  A review of the types of fats and their impact on blood cholesterol levels, along with dietary recommendations to reduce fat intake is also included.  Nutrition Action Plan  Clinical staff conducted group or individual video education with verbal and written material and guidebook.  Patient learns how to incorporate Pritikin recommendations into their lifestyle. Recommendations include planning and keeping personal health goals in mind as an important part of their success.  Healthy Mind-Set    Healthy Minds, Bodies, Hearts  Clinical staff conducted group or individual video education with verbal and written material and guidebook.  Patient learns how to identify when they are stressed. Video will discuss the impact of that stress, as well as the many benefits of stress management. Patient will also be introduced to stress management techniques. The way we think, act, and feel has an impact on our hearts.  How Our Thoughts Can Heal Our Hearts  Clinical staff conducted group or individual video education with verbal and written material and guidebook.  Patient learns that negative thoughts can cause depression and anxiety. This can result in negative lifestyle behavior and serious health problems. Cognitive behavioral therapy is an effective method to help control our thoughts in order to change and improve our emotional outlook.  Additional Videos:  Exercise    Improving Performance  Clinical staff conducted group or individual video education with verbal and written material and guidebook.  Patient learns to use a non-linear approach by alternating intensity levels and lengths of time spent exercising to help burn more calories and lose more body fat. Cardiovascular exercise helps improve heart health, metabolism, hormonal balance, blood sugar control, and recovery from fatigue. Resistance training improves strength, endurance, balance, coordination, reaction time, metabolism, and muscle mass.  Flexibility exercise improves circulation, posture, and balance. Seek guidance from your physician and exercise physiologist before implementing an exercise routine and learn your capabilities and proper form for all exercise.  Introduction to Yoga  Clinical staff conducted group or individual video education with verbal and written material and guidebook.  Patient learns about yoga, a discipline of the coming together of mind, breath, and body. The benefits  of yoga include improved flexibility, improved range of motion, better posture and core strength, increased lung function, weight loss, and positive self-image. Yogas heart health benefits include lowered blood pressure, healthier heart rate, decreased cholesterol and triglyceride levels, improved immune function, and reduced stress. Seek guidance from your physician and exercise physiologist before implementing an exercise routine and learn your capabilities and proper form for all exercise.  Medical   Aging: Enhancing Your Quality of Life  Clinical staff conducted group or individual video education with verbal and written material and guidebook.  Patient learns key strategies and recommendations to stay in good physical health and enhance quality of life, such as prevention strategies, having an advocate, securing a Health Care Proxy and Power of Attorney, and keeping a list of medications and system for tracking them. It also discusses how to avoid risk for bone loss.  Biology of Weight Control  Clinical staff conducted group or individual video education with verbal and written material and guidebook.  Patient learns that weight gain occurs because we consume more calories than we burn (eating more, moving less). Even if your body weight is normal, you may have higher ratios of fat compared to muscle mass. Too much body fat puts you at increased risk for cardiovascular disease, heart attack, stroke, type 2 diabetes, and obesity-related  cancers. In addition to exercise, following the Pritikin Eating Plan can help reduce your risk.  Decoding Lab Results  Clinical staff conducted group or individual video education with verbal and written material and guidebook.  Patient learns that lab test reflects one measurement whose values change over time and are influenced by many factors, including medication, stress, sleep, exercise, food, hydration, pre-existing medical conditions, and more. It is recommended to use the knowledge from this video to become more involved with your lab results and evaluate your numbers to speak with your doctor.   Diseases of Our Time - Overview  Clinical staff conducted group or individual video education with verbal and written material and guidebook.  Patient learns that according to the CDC, 50% to 70% of chronic diseases (such as obesity, type 2 diabetes, elevated lipids, hypertension, and heart disease) are avoidable through lifestyle improvements including healthier food choices, listening to satiety cues, and increased physical activity.  Sleep Disorders Clinical staff conducted group or individual video education with verbal and written material and guidebook.  Patient learns how good quality and duration of sleep are important to overall health and well-being. Patient also learns about sleep disorders and how they impact health along with recommendations to address them, including discussing with a physician.  Nutrition  Dining Out - Part 2 Clinical staff conducted group or individual video education with verbal and written material and guidebook.  Patient learns how to plan ahead and communicate in order to maximize their dining experience in a healthy and nutritious manner. Included are recommended food choices based on the type of restaurant the patient is visiting.   Fueling a Banker conducted group or individual video education with verbal and written material and  guidebook.  There is a strong connection between our food choices and our health. Diseases like obesity and type 2 diabetes are very prevalent and are in large-part due to lifestyle choices. The Pritikin Eating Plan provides plenty of food and hunger-curbing satisfaction. It is easy to follow, affordable, and helps reduce health risks.  Menu Workshop  Clinical staff conducted group or individual video education with verbal and written material and guidebook.  Patient learns that restaurant meals can sabotage health goals because they are often packed with calories, fat, sodium, and sugar. Recommendations include strategies to plan ahead and to communicate with the manager, chef, or server to help order a healthier meal.  Planning Your Eating Strategy  Clinical staff conducted group or individual video education with verbal and written material and guidebook.  Patient learns about the Pritikin Eating Plan and its benefit of reducing the risk of disease. The Pritikin Eating Plan does not focus on calories. Instead, it emphasizes high-quality, nutrient-rich foods. By knowing the characteristics of the foods, we choose, we can determine their calorie density and make informed decisions.  Targeting Your Nutrition Priorities  Clinical staff conducted group or individual video education with verbal and written material and guidebook.  Patient learns that lifestyle habits have a tremendous impact on disease risk and progression. This video provides eating and physical activity recommendations based on your personal health goals, such as reducing LDL cholesterol, losing weight, preventing or controlling type 2 diabetes, and reducing high blood pressure.  Vitamins and Minerals  Clinical staff conducted group or individual video education with verbal and written material and guidebook.  Patient learns different ways to obtain key vitamins and minerals, including through a recommended healthy diet. It is  important to discuss all supplements you take with your doctor.   Healthy Mind-Set    Smoking Cessation  Clinical staff conducted group or individual video education with verbal and written material and guidebook.  Patient learns that cigarette smoking and tobacco addiction pose a serious health risk which affects millions of people. Stopping smoking will significantly reduce the risk of heart disease, lung disease, and many forms of cancer. Recommended strategies for quitting are covered, including working with your doctor to develop a successful plan.  Culinary   Becoming a Set Designer conducted group or individual video education with verbal and written material and guidebook.  Patient learns that cooking at home can be healthy, cost-effective, quick, and puts them in control. Keys to cooking healthy recipes will include looking at your recipe, assessing your equipment needs, planning ahead, making it simple, choosing cost-effective seasonal ingredients, and limiting the use of added fats, salts, and sugars.  Cooking - Breakfast and Snacks  Clinical staff conducted group or individual video education with verbal and written material and guidebook.  Patient learns how important breakfast is to satiety and nutrition through the entire day. Recommendations include key foods to eat during breakfast to help stabilize blood sugar levels and to prevent overeating at meals later in the day. Planning ahead is also a key component.  Cooking - Educational Psychologist conducted group or individual video education with verbal and written material and guidebook.  Patient learns eating strategies to improve overall health, including an approach to cook more at home. Recommendations include thinking of animal protein as a side on your plate rather than center stage and focusing instead on lower calorie dense options like vegetables, fruits, whole grains, and plant-based proteins,  such as beans. Making sauces in large quantities to freeze for later and leaving the skin on your vegetables are also recommended to maximize your experience.  Cooking - Healthy Salads and Dressing Clinical staff conducted group or individual video education with verbal and written material and guidebook.  Patient learns that vegetables, fruits, whole grains, and legumes are the foundations of the Pritikin Eating Plan. Recommendations include how to incorporate each of these in flavorful and  healthy salads, and how to create homemade salad dressings. Proper handling of ingredients is also covered. Cooking - Soups and [employer] - Soups and Desserts Clinical staff conducted group or individual video education with verbal and written material and guidebook.  Patient learns that Pritikin soups and desserts make for easy, nutritious, and delicious snacks and meal components that are low in sodium, fat, sugar, and calorie density, while high in vitamins, minerals, and filling fiber. Recommendations include simple and healthy ideas for soups and desserts.   Overview     The Pritikin Solution Program Overview Clinical staff conducted group or individual video education with verbal and written material and guidebook.  Patient learns that the results of the Pritikin Program have been documented in more than 100 articles published in peer-reviewed journals, and the benefits include reducing risk factors for (and, in some cases, even reversing) high cholesterol, high blood pressure, type 2 diabetes, obesity, and more! An overview of the three key pillars of the Pritikin Program will be covered: eating well, doing regular exercise, and having a healthy mind-set.  WORKSHOPS  Exercise: Exercise Basics: Building Your Action Plan Clinical staff led group instruction and group discussion with PowerPoint presentation and patient guidebook. To enhance the learning environment the use of posters, models and  videos may be added. At the conclusion of this workshop, patients will comprehend the difference between physical activity and exercise, as well as the benefits of incorporating both, into their routine. Patients will understand the FITT (Frequency, Intensity, Time, and Type) principle and how to use it to build an exercise action plan. In addition, safety concerns and other considerations for exercise and cardiac rehab will be addressed by the presenter. The purpose of this lesson is to promote a comprehensive and effective weekly exercise routine in order to improve patients overall level of fitness.   Managing Heart Disease: Your Path to a Healthier Heart Clinical staff led group instruction and group discussion with PowerPoint presentation and patient guidebook. To enhance the learning environment the use of posters, models and videos may be added.At the conclusion of this workshop, patients will understand the anatomy and physiology of the heart. Additionally, they will understand how Pritikins three pillars impact the risk factors, the progression, and the management of heart disease.  The purpose of this lesson is to provide a high-level overview of the heart, heart disease, and how the Pritikin lifestyle positively impacts risk factors.  Exercise Biomechanics Clinical staff led group instruction and group discussion with PowerPoint presentation and patient guidebook. To enhance the learning environment the use of posters, models and videos may be added. Patients will learn how the structural parts of their bodies function and how these functions impact their daily activities, movement, and exercise. Patients will learn how to promote a neutral spine, learn how to manage pain, and identify ways to improve their physical movement in order to promote healthy living. The purpose of this lesson is to expose patients to common physical limitations that impact physical activity. Participants  will learn practical ways to adapt and manage aches and pains, and to minimize their effect on regular exercise. Patients will learn how to maintain good posture while sitting, walking, and lifting.  Balance Training and Fall Prevention  Clinical staff led group instruction and group discussion with PowerPoint presentation and patient guidebook. To enhance the learning environment the use of posters, models and videos may be added. At the conclusion of this workshop, patients will understand the importance of their  sensorimotor skills (vision, proprioception, and the vestibular system) in maintaining their ability to balance as they age. Patients will apply a variety of balancing exercises that are appropriate for their current level of function. Patients will understand the common causes for poor balance, possible solutions to these problems, and ways to modify their physical environment in order to minimize their fall risk. The purpose of this lesson is to teach patients about the importance of maintaining balance as they age and ways to minimize their risk of falling.  WORKSHOPS   Nutrition:  Fueling a Ship Broker led group instruction and group discussion with PowerPoint presentation and patient guidebook. To enhance the learning environment the use of posters, models and videos may be added. Patients will review the foundational principles of the Pritikin Eating Plan and understand what constitutes a serving size in each of the food groups. Patients will also learn Pritikin-friendly foods that are better choices when away from home and review make-ahead meal and snack options. Calorie density will be reviewed and applied to three nutrition priorities: weight maintenance, weight loss, and weight gain. The purpose of this lesson is to reinforce (in a group setting) the key concepts around what patients are recommended to eat and how to apply these guidelines when away from home by  planning and selecting Pritikin-friendly options. Patients will understand how calorie density may be adjusted for different weight management goals.  Mindful Eating  Clinical staff led group instruction and group discussion with PowerPoint presentation and patient guidebook. To enhance the learning environment the use of posters, models and videos may be added. Patients will briefly review the concepts of the Pritikin Eating Plan and the importance of low-calorie dense foods. The concept of mindful eating will be introduced as well as the importance of paying attention to internal hunger signals. Triggers for non-hunger eating and techniques for dealing with triggers will be explored. The purpose of this lesson is to provide patients with the opportunity to review the basic principles of the Pritikin Eating Plan, discuss the value of eating mindfully and how to measure internal cues of hunger and fullness using the Hunger Scale. Patients will also discuss reasons for non-hunger eating and learn strategies to use for controlling emotional eating.  Targeting Your Nutrition Priorities Clinical staff led group instruction and group discussion with PowerPoint presentation and patient guidebook. To enhance the learning environment the use of posters, models and videos may be added. Patients will learn how to determine their genetic susceptibility to disease by reviewing their family history. Patients will gain insight into the importance of diet as part of an overall healthy lifestyle in mitigating the impact of genetics and other environmental insults. The purpose of this lesson is to provide patients with the opportunity to assess their personal nutrition priorities by looking at their family history, their own health history and current risk factors. Patients will also be able to discuss ways of prioritizing and modifying the Pritikin Eating Plan for their highest risk areas  Menu  Clinical staff led group  instruction and group discussion with PowerPoint presentation and patient guidebook. To enhance the learning environment the use of posters, models and videos may be added. Using menus brought in from [employer], or printed from [employer], patients will apply the Pritikin dining out guidelines that were presented in the [employer] video. Patients will also be able to practice these guidelines in a variety of provided scenarios. The purpose of this lesson is to  provide patients with the opportunity to practice hands-on learning of the [employer] guidelines with actual menus and practice scenarios.  Label Reading Clinical staff led group instruction and group discussion with PowerPoint presentation and patient guidebook. To enhance the learning environment the use of posters, models and videos may be added. Patients will review and discuss the Pritikin label reading guidelines presented in Pritikins Label Reading Educational series video. Using fool labels brought in from local grocery stores and markets, patients will apply the label reading guidelines and determine if the packaged food meet the Pritikin guidelines. The purpose of this lesson is to provide patients with the opportunity to review, discuss, and practice hands-on learning of the Pritikin Label Reading guidelines with actual packaged food labels. Cooking School  Pritikins [employer] are designed to teach patients ways to prepare quick, simple, and affordable recipes at home. The importance of nutritions role in chronic disease risk reduction is reflected in its emphasis in the overall Pritikin program. By learning how to prepare essential core Pritikin Eating Plan recipes, patients will increase control over what they eat; be able to customize the flavor of foods without the use of added salt, sugar, or fat; and improve the quality of the food they consume. By learning a set of core recipes  which are easily assembled, quickly prepared, and affordable, patients are more likely to prepare more healthy foods at home. These workshops focus on convenient breakfasts, simple entres, side dishes, and desserts which can be prepared with minimal effort and are consistent with nutrition recommendations for cardiovascular risk reduction. Cooking [employer] are taught by a armed forces logistics/support/administrative officer (RD) who has been trained by the [employer]. The chef or RD has a clear understanding of the importance of minimizing - if not completely eliminating - added fat, sugar, and sodium in recipes. Throughout the series of Cooking School Workshop sessions, patients will learn about healthy ingredients and efficient methods of cooking to build confidence in their capability to prepare    Cooking School weekly topics:  Adding Flavor- Sodium-Free  Fast and Healthy Breakfasts  Powerhouse Plant-Based Proteins  Satisfying Salads and Dressings  Simple Sides and Sauces  International Cuisine-Spotlight on the [employer] Zones  Delicious Desserts  Savory Soups  [employer] - Meals in a Astronomer Appetizers and Snacks  Comforting Weekend Breakfasts  One-Pot Wonders   Fast Evening Meals  Landscape Architect Your Pritikin Plate  WORKSHOPS   Healthy Mindset (Psychosocial):  Focused Goals, Sustainable Changes Clinical staff led group instruction and group discussion with PowerPoint presentation and patient guidebook. To enhance the learning environment the use of posters, models and videos may be added. Patients will be able to apply effective goal setting strategies to establish at least one personal goal, and then take consistent, meaningful action toward that goal. They will learn to identify common barriers to achieving personal goals and develop strategies to overcome them. Patients will also gain an understanding of how our mind-set can impact our ability to achieve  goals and the importance of cultivating a positive and growth-oriented mind-set. The purpose of this lesson is to provide patients with a deeper understanding of how to set and achieve personal goals, as well as the tools and strategies needed to overcome common obstacles which may arise along the way.  From Head to Heart: The Power of a Healthy Outlook  Clinical staff led group instruction and group discussion with PowerPoint presentation and patient guidebook.  To enhance the learning environment the use of posters, models and videos may be added. Patients will be able to recognize and describe the impact of emotions and mood on physical health. They will discover the importance of self-care and explore self-care practices which may work for them. Patients will also learn how to utilize the 4 Cs to cultivate a healthier outlook and better manage stress and challenges. The purpose of this lesson is to demonstrate to patients how a healthy outlook is an essential part of maintaining good health, especially as they continue their cardiac rehab journey.  Healthy Sleep for a Healthy Heart Clinical staff led group instruction and group discussion with PowerPoint presentation and patient guidebook. To enhance the learning environment the use of posters, models and videos may be added. At the conclusion of this workshop, patients will be able to demonstrate knowledge of the importance of sleep to overall health, well-being, and quality of life. They will understand the symptoms of, and treatments for, common sleep disorders. Patients will also be able to identify daytime and nighttime behaviors which impact sleep, and they will be able to apply these tools to help manage sleep-related challenges. The purpose of this lesson is to provide patients with a general overview of sleep and outline the importance of quality sleep. Patients will learn about a few of the most common sleep disorders. Patients will also be  introduced to the concept of sleep hygiene, and discover ways to self-manage certain sleeping problems through simple daily behavior changes. Finally, the workshop will motivate patients by clarifying the links between quality sleep and their goals of heart-healthy living.   Recognizing and Reducing Stress Clinical staff led group instruction and group discussion with PowerPoint presentation and patient guidebook. To enhance the learning environment the use of posters, models and videos may be added. At the conclusion of this workshop, patients will be able to understand the types of stress reactions, differentiate between acute and chronic stress, and recognize the impact that chronic stress has on their health. They will also be able to apply different coping mechanisms, such as reframing negative self-talk. Patients will have the opportunity to practice a variety of stress management techniques, such as deep abdominal breathing, progressive muscle relaxation, and/or guided imagery.  The purpose of this lesson is to educate patients on the role of stress in their lives and to provide healthy techniques for coping with it.  Learning Barriers/Preferences:  Learning Barriers/Preferences - 10/27/24 0913       Learning Barriers/Preferences   Learning Barriers Sight    Learning Preferences Written Material          Education Topics:  Knowledge Questionnaire Score:  Knowledge Questionnaire Score - 10/27/24 1620       Knowledge Questionnaire Score   Pre Score 23/24          Core Components/Risk Factors/Patient Goals at Admission:  Personal Goals and Risk Factors at Admission - 10/27/24 0926       Core Components/Risk Factors/Patient Goals on Admission   Diabetes Yes    Intervention Provide education about signs/symptoms and action to take for hypo/hyperglycemia.;Provide education about proper nutrition, including hydration, and aerobic/resistive exercise prescription along with  prescribed medications to achieve blood glucose in normal ranges: Fasting glucose 65-99 mg/dL    Expected Outcomes Short Term: Participant verbalizes understanding of the signs/symptoms and immediate care of hyper/hypoglycemia, proper foot care and importance of medication, aerobic/resistive exercise and nutrition plan for blood glucose control.;Long Term: Attainment of HbA1C <  7%.    Heart Failure Yes    Intervention Provide a combined exercise and nutrition program that is supplemented with education, support and counseling about heart failure. Directed toward relieving symptoms such as shortness of breath, decreased exercise tolerance, and extremity edema.    Expected Outcomes Improve functional capacity of life;Short term: Attendance in program 2-3 days a week with increased exercise capacity. Reported lower sodium intake. Reported increased fruit and vegetable intake. Reports medication compliance.;Short term: Daily weights obtained and reported for increase. Utilizing diuretic protocols set by physician.;Long term: Adoption of self-care skills and reduction of barriers for early signs and symptoms recognition and intervention leading to self-care maintenance.    Hypertension Yes    Intervention Provide education on lifestyle modifcations including regular physical activity/exercise, weight management, moderate sodium restriction and increased consumption of fresh fruit, vegetables, and low fat dairy, alcohol  moderation, and smoking cessation.;Monitor prescription use compliance.    Expected Outcomes Short Term: Continued assessment and intervention until BP is < 140/8mm HG in hypertensive participants. < 130/100mm HG in hypertensive participants with diabetes, heart failure or chronic kidney disease.;Long Term: Maintenance of blood pressure at goal levels.    Lipids Yes    Intervention Provide education and support for participant on nutrition & aerobic/resistive exercise along with prescribed  medications to achieve LDL 70mg , HDL >40mg .    Expected Outcomes Long Term: Cholesterol controlled with medications as prescribed, with individualized exercise RX and with personalized nutrition plan. Value goals: LDL < 70mg , HDL > 40 mg.;Short Term: Participant states understanding of desired cholesterol values and is compliant with medications prescribed. Participant is following exercise prescription and nutrition guidelines.    Stress Yes    Intervention Offer individual and/or small group education and counseling on adjustment to heart disease, stress management and health-related lifestyle change. Teach and support self-help strategies.;Refer participants experiencing significant psychosocial distress to appropriate mental health specialists for further evaluation and treatment. When possible, include family members and significant others in education/counseling sessions.    Expected Outcomes Short Term: Participant demonstrates changes in health-related behavior, relaxation and other stress management skills, ability to obtain effective social support, and compliance with psychotropic medications if prescribed.;Long Term: Emotional wellbeing is indicated by absence of clinically significant psychosocial distress or social isolation.          Core Components/Risk Factors/Patient Goals Review:   Goals and Risk Factor Review     Row Name 11/01/24 1717             Core Components/Risk Factors/Patient Goals Review   Personal Goals Review Diabetes;Heart Failure;Hypertension;Lipids;Stress       Review Sally Hendrix has yet to complete her first exercise class due to inclement weather.       Expected Outcomes Sally Hendrix will continue to particpate in cardiac rehab for exercise nutrition and lifestyle modifications.          Core Components/Risk Factors/Patient Goals at Discharge (Final Review):   Goals and Risk Factor Review - 11/01/24 1717       Core Components/Risk Factors/Patient Goals Review    Personal Goals Review Diabetes;Heart Failure;Hypertension;Lipids;Stress    Review Sally Hendrix has yet to complete her first exercise class due to inclement weather.    Expected Outcomes Sally Hendrix will continue to particpate in cardiac rehab for exercise nutrition and lifestyle modifications.          ITP Comments:  ITP Comments     Row Name 10/27/24 0754           ITP Comments Traci  Shlomo, MD: Medical Director. Introduction to the [facility] / Intensive Cardiac Rehab. Initial orientation packet reviewed with the patient.          Comments: Pt has yet to complete first day of cardiac rehab post-orientation due to inclement weather    [1]  Current Outpatient Medications:    acetaminophen  (TYLENOL ) 500 MG tablet, Take 1,000 mg by mouth every 6 (six) hours as needed for mild pain (pain score 1-3) or headache., Disp: , Rfl:    aspirin  81 MG chewable tablet, Chew 1 tablet (81 mg total) by mouth daily., Disp: 30 tablet, Rfl: 2   atorvastatin  (LIPITOR ) 80 MG tablet, Take 1 tablet (80 mg total) by mouth daily. (Patient not taking: Reported on 10/27/2024), Disp: 90 tablet, Rfl: 3   carvedilol  (COREG ) 6.25 MG tablet, Take 1 tablet (6.25 mg total) by mouth 2 (two) times daily with a meal., Disp: 60 tablet, Rfl: 0   Dextromethorphan -buPROPion  ER (AUVELITY ) 45-105 MG TBCR, Take 1 tablet by mouth in the morning., Disp: , Rfl:    Insulin  Isophane & Regular Human (NOVOLIN 70/30 FLEXPEN RELION) (70-30) 100 UNIT/ML PEN, Inject 15 Units into the skin 2 (two) times daily before a meal. (Patient taking differently: Inject 4 Units into the skin 4 (four) times daily.), Disp: 15 mL, Rfl: 0   sacubitril -valsartan  (ENTRESTO ) 49-51 MG, Take 1 tablet by mouth 2 (two) times daily., Disp: 60 tablet, Rfl: 3   spironolactone  (ALDACTONE ) 25 MG tablet, Take 1 tablet (25 mg total) by mouth daily. (Patient not taking: Reported on 10/27/2024), Disp: 90 tablet, Rfl: 3   Turmeric (QC TUMERIC COMPLEX) 500 MG CAPS,  Take 1 capsule by mouth in the morning., Disp: , Rfl:  [2]  Social History Tobacco Use  Smoking Status Former   Current packs/day: 0.00   Types: Cigarettes   Quit date: 1979   Years since quitting: 47.1  Smokeless Tobacco Never  Tobacco Comments   smoked alot for 1 yr

## 2024-11-02 ENCOUNTER — Other Ambulatory Visit: Payer: Self-pay | Admitting: Internal Medicine

## 2024-11-02 ENCOUNTER — Encounter (HOSPITAL_COMMUNITY)

## 2024-11-02 ENCOUNTER — Telehealth (HOSPITAL_COMMUNITY): Payer: Self-pay

## 2024-11-02 ENCOUNTER — Telehealth (HOSPITAL_COMMUNITY): Payer: Self-pay | Admitting: *Deleted

## 2024-11-02 DIAGNOSIS — Z1231 Encounter for screening mammogram for malignant neoplasm of breast: Secondary | ICD-10-CM

## 2024-11-02 NOTE — Telephone Encounter (Signed)
 Called Dr Jakie office,Pt's  PCP, to verify pt was able to complete her scheduled follow up on 1/27. Confirmed she was here for her physical.  Requested copy of visit to determine when this pt may reschedule her Cardiac rehab orientation appt.  Contact information given.  Await fax. Sally Hendrix PEAK, BSN Cardiac and Emergency Planning/management Officer

## 2024-11-02 NOTE — Telephone Encounter (Signed)
 Pt aware of results

## 2024-11-02 NOTE — Telephone Encounter (Signed)
 Patient's son called wanting to finish scheduling her walk test. Informed him we are waiting for office notes from Dr. Yolande so we can confirm she is clear to come to cardiac rehab, and we will call to finish scheduling the walk test once we receive them.

## 2024-11-03 ENCOUNTER — Telehealth (HOSPITAL_COMMUNITY): Payer: Self-pay

## 2024-11-03 NOTE — Telephone Encounter (Signed)
 Notes from Dr Baird office received and attached to referral.

## 2024-11-04 ENCOUNTER — Telehealth (HOSPITAL_COMMUNITY): Payer: Self-pay

## 2024-11-04 ENCOUNTER — Encounter (HOSPITAL_COMMUNITY)

## 2024-11-04 ENCOUNTER — Telehealth (HOSPITAL_COMMUNITY): Payer: Self-pay | Admitting: *Deleted

## 2024-11-04 NOTE — Telephone Encounter (Signed)
 Received requested documents from Dr Jakie.  Pt seen by PCP on 1/27.  Changes made to her insulin  routine. Increased 70/30 100 units/ml from 6-10 units four times a day prior to meals.  Pt admits that she is reluctant to take her meds as prescribed on a regular basis.  Diabetes educator consulted and plan to look at eligibility for insulin  pump. Will have support staff schedule for the walk test portion of her orientation as staff was able to complete remaining assessments. Reminders to take her insulin  as prescribed with meals and CBG must be less than 300 and greater than 110 to exercise in Cardiac rehab. Saturnino Bernett PEAK, BSN Cardiac and Emergency Planning/management Officer

## 2024-11-04 NOTE — Telephone Encounter (Signed)
 Scheduled pt walk test for cardiac rehab for 2/3@115 .. left a message for pt to advised her of her walk test date and time.

## 2024-11-07 ENCOUNTER — Ambulatory Visit (HOSPITAL_COMMUNITY)
Admission: RE | Admit: 2024-11-07 | Discharge: 2024-11-07 | Disposition: A | Discharge status: Home / Self Care | Source: Ambulatory Visit | Attending: Cardiology | Admitting: Cardiology

## 2024-11-07 ENCOUNTER — Encounter (HOSPITAL_COMMUNITY)

## 2024-11-07 DIAGNOSIS — I5020 Unspecified systolic (congestive) heart failure: Secondary | ICD-10-CM | POA: Insufficient documentation

## 2024-11-07 LAB — BASIC METABOLIC PANEL WITH GFR
Anion gap: 10 (ref 5–15)
BUN: 39 mg/dL — ABNORMAL HIGH (ref 8–23)
CO2: 26 mmol/L (ref 22–32)
Calcium: 9.6 mg/dL (ref 8.9–10.3)
Chloride: 100 mmol/L (ref 98–111)
Creatinine, Ser: 1.97 mg/dL — ABNORMAL HIGH (ref 0.44–1.00)
GFR, Estimated: 27 mL/min — ABNORMAL LOW
Glucose, Bld: 279 mg/dL — ABNORMAL HIGH (ref 70–99)
Potassium: 5 mmol/L (ref 3.5–5.1)
Sodium: 136 mmol/L (ref 135–145)

## 2024-11-08 ENCOUNTER — Ambulatory Visit (HOSPITAL_COMMUNITY): Payer: Self-pay | Admitting: Internal Medicine

## 2024-11-08 ENCOUNTER — Encounter (HOSPITAL_COMMUNITY): Admission: RE | Admit: 2024-11-08

## 2024-11-08 ENCOUNTER — Encounter (HOSPITAL_COMMUNITY): Payer: Self-pay

## 2024-11-08 ENCOUNTER — Telehealth (HOSPITAL_COMMUNITY): Payer: Self-pay

## 2024-11-08 NOTE — Telephone Encounter (Signed)
 Pt son and canceled pt cardiac rehab walk test stated that pt was not feeling well. I called pt back and left a message to advise pt that she would need to call back and reschedule her cardiac rehab because she will have to complete the walk test before starting the program. Placed pt ppw in the 2 week f/u.

## 2024-11-09 ENCOUNTER — Encounter (HOSPITAL_COMMUNITY)

## 2024-11-10 ENCOUNTER — Telehealth (HOSPITAL_COMMUNITY): Payer: Self-pay

## 2024-11-10 ENCOUNTER — Ambulatory Visit: Admitting: Cardiology

## 2024-11-10 ENCOUNTER — Encounter: Payer: Self-pay | Admitting: Cardiology

## 2024-11-10 VITALS — BP 170/90 | HR 91 | Ht 63.0 in | Wt 145.0 lb

## 2024-11-10 DIAGNOSIS — I2510 Atherosclerotic heart disease of native coronary artery without angina pectoris: Secondary | ICD-10-CM | POA: Diagnosis not present

## 2024-11-10 DIAGNOSIS — I5020 Unspecified systolic (congestive) heart failure: Secondary | ICD-10-CM | POA: Diagnosis not present

## 2024-11-10 DIAGNOSIS — I10 Essential (primary) hypertension: Secondary | ICD-10-CM | POA: Diagnosis not present

## 2024-11-10 DIAGNOSIS — E782 Mixed hyperlipidemia: Secondary | ICD-10-CM | POA: Diagnosis not present

## 2024-11-10 MED ORDER — CARVEDILOL 12.5 MG PO TABS: 12.5000 mg | ORAL_TABLET | Freq: Two times a day (BID) | ORAL | 3 refills | Status: AC

## 2024-11-10 MED ORDER — EMPAGLIFLOZIN 10 MG PO TABS: 10.0000 mg | ORAL_TABLET | Freq: Every day | ORAL | 6 refills | Status: AC

## 2024-11-10 NOTE — Patient Instructions (Signed)
 Medication Instructions:   START JARDIANCE  10 MG ONCE DAILY  INCREASE CARVEDILOL  TO 12.5 MG ONE TABLET TWICE DAILY= 2 OF THE 6.25 MG TABLETS TWICE DAILY  *If you need a refill on your cardiac medications before your next appointment, please call your pharmacy*  Lab Work:  Your physician recommends that you return for lab work in: ONE WEEK  If you have labs (blood work) drawn today and your tests are completely normal, you will receive your results only by: [employer] (if you have MyChart) OR A paper copy in the mail If you have any lab test that is abnormal or we need to change your treatment, we will call you to review the results.  Testing/Procedures:  Your physician has requested that you have an echocardiogram. Echocardiography is a painless test that uses sound waves to create images of your heart. It provides your doctor with information about the size and shape of your heart and how well your hearts chambers and valves are working. This procedure takes approximately one hour. There are no restrictions for this procedure. Please do NOT wear cologne, perfume, aftershave, or lotions (deodorant is allowed). Please arrive 15 minutes prior to your appointment time.  Please note: We ask at that you not bring children with you during ultrasound (echo/ vascular) testing. Due to room size and safety concerns, children are not allowed in the ultrasound rooms during exams. Our front office staff cannot provide observation of children in our lobby area while testing is being conducted. An adult accompanying a patient to their appointment will only be allowed in the ultrasound room at the discretion of the ultrasound technician under special circumstances. We apologize for any inconvenience. [address]-SCHEDULE IN 3 MONTHS  Follow-Up: At [facility], you and your health needs are our priority.  As part of our continuing mission to provide you with exceptional heart care, our  providers are all part of one team.  This team includes your primary Cardiologist (physician) and Advanced Practice Providers or APPs (Physician Assistants and Nurse Practitioners) who all work together to provide you with the care you need, when you need it.  Your next appointment:   3 month(s)  Provider:   Jon Hails, PA-C, Dayna Dunn, PA-C, Madison Fountain, NP, Callie Goodrich, PA-C, Kathleen Johnson, PA-C, Michele Lenze, PA-C, Hao Meng, PA-C, Damien Braver, NP, Glendia Ferrier, PA-C, or Katlyn West, NP      Then, Redell Shallow, MD will plan to see you again in 6 month(s).

## 2024-11-10 NOTE — Telephone Encounter (Signed)
 Pt will come in for walk test for cardiac rehab on 2/26@115  and will attend the 1230 exercise class time.   Sent letter

## 2024-11-11 ENCOUNTER — Encounter (HOSPITAL_COMMUNITY)

## 2024-11-14 ENCOUNTER — Encounter (HOSPITAL_COMMUNITY)

## 2024-11-16 ENCOUNTER — Encounter (HOSPITAL_COMMUNITY)

## 2024-11-18 ENCOUNTER — Encounter (HOSPITAL_COMMUNITY)

## 2024-11-21 ENCOUNTER — Encounter (HOSPITAL_COMMUNITY)

## 2024-11-23 ENCOUNTER — Encounter (HOSPITAL_COMMUNITY)

## 2024-11-24 ENCOUNTER — Ambulatory Visit (HOSPITAL_COMMUNITY)

## 2024-11-25 ENCOUNTER — Encounter (HOSPITAL_COMMUNITY)

## 2024-11-28 ENCOUNTER — Encounter (HOSPITAL_COMMUNITY)

## 2024-11-30 ENCOUNTER — Encounter (HOSPITAL_COMMUNITY)

## 2024-12-01 ENCOUNTER — Encounter (HOSPITAL_COMMUNITY)

## 2024-12-02 ENCOUNTER — Encounter (HOSPITAL_COMMUNITY)

## 2024-12-05 ENCOUNTER — Encounter (HOSPITAL_COMMUNITY)

## 2024-12-07 ENCOUNTER — Encounter (HOSPITAL_COMMUNITY)

## 2024-12-09 ENCOUNTER — Encounter (HOSPITAL_COMMUNITY)

## 2024-12-12 ENCOUNTER — Encounter (HOSPITAL_COMMUNITY)

## 2024-12-14 ENCOUNTER — Encounter (HOSPITAL_COMMUNITY)

## 2024-12-16 ENCOUNTER — Encounter (HOSPITAL_COMMUNITY)

## 2024-12-19 ENCOUNTER — Encounter (HOSPITAL_COMMUNITY)

## 2024-12-19 ENCOUNTER — Other Ambulatory Visit (HOSPITAL_COMMUNITY)

## 2024-12-19 ENCOUNTER — Ambulatory Visit (HOSPITAL_COMMUNITY)

## 2024-12-21 ENCOUNTER — Encounter (HOSPITAL_COMMUNITY)

## 2024-12-23 ENCOUNTER — Encounter (HOSPITAL_COMMUNITY)

## 2024-12-26 ENCOUNTER — Encounter (HOSPITAL_COMMUNITY)

## 2024-12-28 ENCOUNTER — Encounter (HOSPITAL_COMMUNITY)

## 2024-12-30 ENCOUNTER — Encounter (HOSPITAL_COMMUNITY)

## 2025-01-02 ENCOUNTER — Encounter (HOSPITAL_COMMUNITY)

## 2025-01-04 ENCOUNTER — Encounter (HOSPITAL_COMMUNITY)

## 2025-01-06 ENCOUNTER — Encounter (HOSPITAL_COMMUNITY)

## 2025-01-09 ENCOUNTER — Encounter (HOSPITAL_COMMUNITY)

## 2025-01-11 ENCOUNTER — Encounter (HOSPITAL_COMMUNITY)

## 2025-01-13 ENCOUNTER — Encounter (HOSPITAL_COMMUNITY)

## 2025-01-16 ENCOUNTER — Encounter (HOSPITAL_COMMUNITY)

## 2025-01-18 ENCOUNTER — Encounter (HOSPITAL_COMMUNITY)

## 2025-01-19 ENCOUNTER — Ambulatory Visit

## 2025-01-20 ENCOUNTER — Encounter (HOSPITAL_COMMUNITY)

## 2025-01-23 ENCOUNTER — Encounter (HOSPITAL_COMMUNITY)

## 2025-01-25 ENCOUNTER — Encounter (HOSPITAL_COMMUNITY)

## 2025-01-27 ENCOUNTER — Encounter (HOSPITAL_COMMUNITY)

## 2025-01-30 ENCOUNTER — Encounter (HOSPITAL_COMMUNITY)

## 2025-02-01 ENCOUNTER — Encounter (HOSPITAL_COMMUNITY)

## 2025-02-03 ENCOUNTER — Encounter (HOSPITAL_COMMUNITY)

## 2025-02-06 ENCOUNTER — Encounter (HOSPITAL_COMMUNITY)

## 2025-02-07 ENCOUNTER — Ambulatory Visit (HOSPITAL_COMMUNITY)

## 2025-02-08 ENCOUNTER — Encounter (HOSPITAL_COMMUNITY)

## 2025-02-10 ENCOUNTER — Encounter (HOSPITAL_COMMUNITY)

## 2025-02-13 ENCOUNTER — Encounter (HOSPITAL_COMMUNITY)

## 2025-02-15 ENCOUNTER — Encounter (HOSPITAL_COMMUNITY)

## 2025-02-16 ENCOUNTER — Ambulatory Visit: Admitting: Student

## 2025-02-17 ENCOUNTER — Encounter (HOSPITAL_COMMUNITY)

## 2025-02-20 ENCOUNTER — Encounter (HOSPITAL_COMMUNITY)

## 2025-02-22 ENCOUNTER — Encounter (HOSPITAL_COMMUNITY)
